# Patient Record
Sex: Female | Born: 1945 | Race: Black or African American | Hispanic: No | Smoking: Former smoker
Health system: Southern US, Community
[De-identification: ages and names within clinical notes are randomized; demographics above are authoritative.]

## PROBLEM LIST (undated history)

## (undated) DIAGNOSIS — M1990 Unspecified osteoarthritis, unspecified site: Secondary | ICD-10-CM

## (undated) DIAGNOSIS — R011 Cardiac murmur, unspecified: Secondary | ICD-10-CM

## (undated) DIAGNOSIS — E785 Hyperlipidemia, unspecified: Secondary | ICD-10-CM

## (undated) DIAGNOSIS — H409 Unspecified glaucoma: Secondary | ICD-10-CM

## (undated) DIAGNOSIS — I639 Cerebral infarction, unspecified: Secondary | ICD-10-CM

## (undated) DIAGNOSIS — I10 Essential (primary) hypertension: Secondary | ICD-10-CM

## (undated) DIAGNOSIS — N19 Unspecified kidney failure: Secondary | ICD-10-CM

## (undated) HISTORY — DX: Cerebral infarction, unspecified: I63.9

## (undated) HISTORY — DX: Essential (primary) hypertension: I10

## (undated) HISTORY — DX: Unspecified kidney failure: N19

## (undated) HISTORY — DX: Unspecified glaucoma: H40.9

## (undated) HISTORY — DX: Hyperlipidemia, unspecified: E78.5

## (undated) HISTORY — DX: Cardiac murmur, unspecified: R01.1

---

## 1978-09-02 HISTORY — PX: UMBILICAL HERNIA REPAIR: SHX196

## 1990-09-02 HISTORY — PX: ABDOMINAL HYSTERECTOMY: SHX81

## 1990-09-02 HISTORY — PX: OTHER SURGICAL HISTORY: SHX169

## 2002-09-02 HISTORY — PX: HIATAL HERNIA REPAIR: SHX195

## 2013-06-02 DIAGNOSIS — I639 Cerebral infarction, unspecified: Secondary | ICD-10-CM

## 2013-06-02 HISTORY — DX: Cerebral infarction, unspecified: I63.9

## 2014-05-11 ENCOUNTER — Ambulatory Visit (INDEPENDENT_AMBULATORY_CARE_PROVIDER_SITE_OTHER): Payer: 59 | Admitting: Family

## 2014-05-11 ENCOUNTER — Encounter (INDEPENDENT_AMBULATORY_CARE_PROVIDER_SITE_OTHER): Payer: Self-pay

## 2014-05-11 ENCOUNTER — Encounter: Payer: Self-pay | Admitting: Family

## 2014-05-11 VITALS — BP 203/102 | HR 65 | Temp 98.2°F | Ht 64.5 in | Wt 149.0 lb

## 2014-05-11 DIAGNOSIS — R059 Cough, unspecified: Secondary | ICD-10-CM

## 2014-05-11 DIAGNOSIS — I10 Essential (primary) hypertension: Secondary | ICD-10-CM

## 2014-05-11 DIAGNOSIS — R05 Cough: Secondary | ICD-10-CM

## 2014-05-11 DIAGNOSIS — Z0000 Encounter for general adult medical examination without abnormal findings: Secondary | ICD-10-CM

## 2014-05-11 DIAGNOSIS — I1A0 Resistant hypertension: Secondary | ICD-10-CM

## 2014-05-11 HISTORY — DX: Essential (primary) hypertension: I10

## 2014-05-11 HISTORY — DX: Resistant hypertension: I1A.0

## 2014-05-11 MED ORDER — AMLODIPINE BESYLATE 5 MG PO TABS: 5.0000 mg | ORAL_TABLET | Freq: Every day | ORAL | Status: DC

## 2014-05-11 MED ORDER — ENALAPRIL-HYDROCHLOROTHIAZIDE 10-25 MG PO TABS: 1.0000 | ORAL_TABLET | Freq: Every day | ORAL | Status: DC

## 2014-05-11 MED ORDER — BENZONATATE 200 MG PO CAPS: 200.0000 mg | ORAL_CAPSULE | Freq: Three times a day (TID) | ORAL | Status: DC | PRN

## 2014-05-11 NOTE — Progress Notes (Addendum)
Subjective:    Patient ID: Andrea Ross, female    DOB: [birth year], 68 y.o.   MRN: [id number]  Hypertension This is a chronic problem. The current episode started more than 1 year ago. The problem has been waxing and waning since onset. The problem is uncontrolled. Associated symptoms include blurred vision and headaches. Pertinent negatives include no anxiety, palpitations, peripheral edema or shortness of breath. Risk factors for coronary artery disease include post-menopausal state and family history. Past treatments include ACE inhibitors and calcium channel blockers. The current treatment provides no improvement. Hypertensive end-organ damage includes CVA. There is no history of kidney disease, CAD/MI, heart failure or a thyroid problem. There is no history of sleep apnea.   *Pt states she had a stroke 10/14. Pt has moved from [place] to here and has not been taking her blood pressure medication regularly. Stating she was trying to make it last until she could get an appointment.    Review of Systems  Constitutional: Negative.   HENT: Positive for tinnitus.   Eyes: Positive for blurred vision.  Respiratory: Negative.  Negative for shortness of breath.   Cardiovascular: Negative.  Negative for palpitations.  Gastrointestinal: Negative.   Endocrine: Negative.   Genitourinary: Negative.   Musculoskeletal: Negative.   Neurological: Positive for headaches.  Hematological: Negative.   Psychiatric/Behavioral: Negative.   All other systems reviewed and are negative.      Objective:   Physical Exam  Vitals reviewed. Constitutional: She is oriented to person, place, and time. She appears well-developed and well-nourished. No distress.  HENT:  Head: Normocephalic and atraumatic.  Right Ear: External ear normal.  Mouth/Throat: Oropharynx is clear and moist.  Eyes: Pupils are equal, round, and reactive to light.  Neck: Normal range of motion. Neck supple. No thyromegaly present.    Cardiovascular: Normal rate, regular rhythm, normal heart sounds and intact distal pulses.   No murmur heard. Pulmonary/Chest: Effort normal and breath sounds normal. No respiratory distress. She has no wheezes.  Abdominal: Soft. Bowel sounds are normal. She exhibits no distension. There is no tenderness.  Musculoskeletal: Normal range of motion. She exhibits no edema and no tenderness.  Neurological: She is alert and oriented to person, place, and time. She has normal reflexes. No cranial nerve deficit.  Skin: Skin is warm and dry.  Psychiatric: She has a normal mood and affect. Her behavior is normal. Judgment and thought content normal.    BP 203/102  Pulse 65  Temp(Src) 98.2 F (36.8 C) (Oral)  Ht 5' 4.5" (1.638 m)  Wt 149 lb (67.586 kg)  BMI 25.19 kg/m2       Assessment & Plan:  1. Essential hypertension, benign -Daily blood pressure log given with instructions on how to fill out and told to bring to next visit -Dash diet information given -Exercise encouraged - Stress Management  -Continue current meds -RTO in 1 weeks  - amLODipine (NORVASC) 5 MG tablet; Take 1 tablet (5 mg total) by mouth daily.  Dispense: 90 tablet; Refill: 3 - CMP14+EGFR - enalapril-hydrochlorothiazide (VASERETIC) 10-25 MG per tablet; Take 1 tablet by mouth daily.  Dispense: 90 tablet; Refill: 3  2. Annual physical exam - CMP14+EGFR - Lipid panel - Vit D  25 hydroxy (rtn osteoporosis monitoring)  3. Cough -DO NOT TAKE ANY DECONGESTANT  - benzonatate (TESSALON) 200 MG capsule; Take 1 capsule (200 mg total) by mouth 3 (three) times daily as needed for cough.  Dispense: 30 capsule; Refill: 2  Continue all meds  Labs pending Health Maintenance reviewed- hemoccult cards given to patient with directions Diet and exercise encouraged RTO 1 weeks for blood pressyre  Evelina Dun, FNP

## 2014-05-11 NOTE — Patient Instructions (Signed)
DASH Eating Plan DASH stands for "Dietary Approaches to Stop Hypertension." The DASH eating plan is a healthy eating plan that has been shown to reduce high blood pressure (hypertension). Additional health benefits may include reducing the risk of type 2 diabetes mellitus, heart disease, and stroke. The DASH eating plan may also help with weight loss. WHAT DO I NEED TO KNOW ABOUT THE DASH EATING PLAN? For the DASH eating plan, you will follow these general guidelines:  Choose foods with a percent daily value for sodium of less than 5% (as listed on the food label).  Use salt-free seasonings or herbs instead of table salt or sea salt.  Check with your health care provider or pharmacist before using salt substitutes.  Eat lower-sodium products, often labeled as "lower sodium" or "no salt added."  Eat fresh foods.  Eat more vegetables, fruits, and low-fat dairy products.  Choose whole grains. Look for the word "whole" as the first word in the ingredient list.  Choose fish and skinless chicken or turkey more often than red meat. Limit fish, poultry, and meat to 6 oz (170 g) each day.  Limit sweets, desserts, sugars, and sugary drinks.  Choose heart-healthy fats.  Limit cheese to 1 oz (28 g) per day.  Eat more home-cooked food and less restaurant, buffet, and fast food.  Limit fried foods.  Cook foods using methods other than frying.  Limit canned vegetables. If you do use them, rinse them well to decrease the sodium.  When eating at a restaurant, ask that your food be prepared with less salt, or no salt if possible. WHAT FOODS CAN I EAT? Seek help from a dietitian for individual calorie needs. Grains Whole grain or whole wheat bread. Brown rice. Whole grain or whole wheat pasta. Quinoa, bulgur, and whole grain cereals. Low-sodium cereals. Corn or whole wheat flour tortillas. Whole grain cornbread. Whole grain crackers. Low-sodium crackers. Vegetables Fresh or frozen vegetables  (raw, steamed, roasted, or grilled). Low-sodium or reduced-sodium tomato and vegetable juices. Low-sodium or reduced-sodium tomato sauce and paste. Low-sodium or reduced-sodium canned vegetables.  Fruits All fresh, canned (in natural juice), or frozen fruits. Meat and Other Protein Products Ground beef (85% or leaner), grass-fed beef, or beef trimmed of fat. Skinless chicken or turkey. Ground chicken or turkey. Pork trimmed of fat. All fish and seafood. Eggs. Dried beans, peas, or lentils. Unsalted nuts and seeds. Unsalted canned beans. Dairy Low-fat dairy products, such as skim or 1% milk, 2% or reduced-fat cheeses, low-fat ricotta or cottage cheese, or plain low-fat yogurt. Low-sodium or reduced-sodium cheeses. Fats and Oils Tub margarines without trans fats. Light or reduced-fat mayonnaise and salad dressings (reduced sodium). Avocado. Safflower, olive, or canola oils. Natural peanut or almond butter. Other Unsalted popcorn and pretzels. The items listed above may not be a complete list of recommended foods or beverages. Contact your dietitian for more options. WHAT FOODS ARE NOT RECOMMENDED? Grains White bread. White pasta. White rice. Refined cornbread. Bagels and croissants. Crackers that contain trans fat. Vegetables Creamed or fried vegetables. Vegetables in a cheese sauce. Regular canned vegetables. Regular canned tomato sauce and paste. Regular tomato and vegetable juices. Fruits Dried fruits. Canned fruit in light or heavy syrup. Fruit juice. Meat and Other Protein Products Fatty cuts of meat. Ribs, chicken wings, bacon, sausage, bologna, salami, chitterlings, fatback, hot dogs, bratwurst, and packaged luncheon meats. Salted nuts and seeds. Canned beans with salt. Dairy Whole or 2% milk, cream, half-and-half, and cream cheese. Whole-fat or sweetened yogurt. Full-fat   cheeses or blue cheese. Nondairy creamers and whipped toppings. Processed cheese, cheese spreads, or cheese  curds. Condiments Onion and garlic salt, seasoned salt, table salt, and sea salt. Canned and packaged gravies. Worcestershire sauce. Tartar sauce. Barbecue sauce. Teriyaki sauce. Soy sauce, including reduced sodium. Steak sauce. Fish sauce. Oyster sauce. Cocktail sauce. Horseradish. Ketchup and mustard. Meat flavorings and tenderizers. Bouillon cubes. Hot sauce. Tabasco sauce. Marinades. Taco seasonings. Relishes. Fats and Oils Butter, stick margarine, lard, shortening, ghee, and bacon fat. Coconut, palm kernel, or palm oils. Regular salad dressings. Other Pickles and olives. Salted popcorn and pretzels. The items listed above may not be a complete list of foods and beverages to avoid. Contact your dietitian for more information. WHERE CAN I FIND MORE INFORMATION? National Heart, Lung, and [employer] Institute: www.nhlbi.nih.gov/health/health-topics/topics/dash/ Document Released: 08/08/2011 Document Revised: 01/03/2014 Document Reviewed: 06/23/2013 [employer] Patient Information 2015 [employer]. This information is not intended to replace advice given to you by your health care provider. Make sure you discuss any questions you have with your health care provider. Hypertension Hypertension, commonly called high blood pressure, is when the force of blood pumping through your arteries is too strong. Your arteries are the blood vessels that carry blood from your heart throughout your body. A blood pressure reading consists of a higher number over a lower number, such as 110/72. The higher number (systolic) is the pressure inside your arteries when your heart pumps. The lower number (diastolic) is the pressure inside your arteries when your heart relaxes. Ideally you want your blood pressure below 120/80. Hypertension forces your heart to work harder to pump blood. Your arteries may become narrow or stiff. Having hypertension puts you at risk for heart disease, stroke, and other problems.  RISK  FACTORS Some risk factors for high blood pressure are controllable. Others are not.  Risk factors you cannot control include:   Race. You may be at higher risk if you are African American.  Age. Risk increases with age.  Gender. Men are at higher risk than women before age 45 years. After age 65, women are at higher risk than men. Risk factors you can control include:  Not getting enough exercise or physical activity.  Being overweight.  Getting too much fat, sugar, calories, or salt in your diet.  Drinking too much alcohol. SIGNS AND SYMPTOMS Hypertension does not usually cause signs or symptoms. Extremely high blood pressure (hypertensive crisis) may cause headache, anxiety, shortness of breath, and nosebleed. DIAGNOSIS  To check if you have hypertension, your health care provider will measure your blood pressure while you are seated, with your arm held at the level of your heart. It should be measured at least twice using the same arm. Certain conditions can cause a difference in blood pressure between your right and left arms. A blood pressure reading that is higher than normal on one occasion does not mean that you need treatment. If one blood pressure reading is high, ask your health care provider about having it checked again. TREATMENT  Treating high blood pressure includes making lifestyle changes and possibly taking medicine. Living a healthy lifestyle can help lower high blood pressure. You may need to change some of your habits. Lifestyle changes may include:  Following the DASH diet. This diet is high in fruits, vegetables, and whole grains. It is low in salt, red meat, and added sugars.  Getting at least 2 hours of brisk physical activity every week.  Losing weight if necessary.  Not smoking.  Limiting   alcoholic beverages.  Learning ways to reduce stress. If lifestyle changes are not enough to get your blood pressure under control, your health care provider may  prescribe medicine. You may need to take more than one. Work closely with your health care provider to understand the risks and benefits. HOME CARE INSTRUCTIONS  Have your blood pressure rechecked as directed by your health care provider.   Take medicines only as directed by your health care provider. Follow the directions carefully. Blood pressure medicines must be taken as prescribed. The medicine does not work as well when you skip doses. Skipping doses also puts you at risk for problems.   Do not smoke.   Monitor your blood pressure at home as directed by your health care provider. SEEK MEDICAL CARE IF:   You think you are having a reaction to medicines taken.  You have recurrent headaches or feel dizzy.  You have swelling in your ankles.  You have trouble with your vision. SEEK IMMEDIATE MEDICAL CARE IF:  You develop a severe headache or confusion.  You have unusual weakness, numbness, or feel faint.  You have severe chest or abdominal pain.  You vomit repeatedly.  You have trouble breathing. MAKE SURE YOU:   Understand these instructions.  Will watch your condition.  Will get help right away if you are not doing well or get worse. Document Released: 08/19/2005 Document Revised: 01/03/2014 Document Reviewed: 06/11/2013 [employer] Patient Information 2015 [employer]. This information is not intended to replace advice given to you by your health care provider. Make sure you discuss any questions you have with your health care provider.  

## 2014-05-12 LAB — CMP14+EGFR
ALT: 12 IU/L (ref 0–32)
AST: 11 IU/L (ref 0–40)
Albumin/Globulin Ratio: 1.6 (ref 1.1–2.5)
Albumin: 4.5 g/dL (ref 3.5–4.8)
Alkaline Phosphatase: 132 IU/L — ABNORMAL HIGH (ref 39–117)
BUN/Creatinine Ratio: 10 — ABNORMAL LOW (ref 11–26)
BUN: 12 mg/dL (ref 8–27)
CALCIUM: 10 mg/dL (ref 8.7–10.3)
CO2: 25 mmol/L (ref 18–29)
Chloride: 102 mmol/L (ref 97–108)
Creatinine, Ser: 1.22 mg/dL — ABNORMAL HIGH (ref 0.57–1.00)
GFR calc Af Amer: 52 mL/min/{1.73_m2} — ABNORMAL LOW (ref >59)
GFR calc non Af Amer: 45 mL/min/{1.73_m2} — ABNORMAL LOW (ref >59)
GLUCOSE: 92 mg/dL (ref 65–99)
Globulin, Total: 2.9 g/dL (ref 1.5–4.5)
Potassium: 4.7 mmol/L (ref 3.5–5.2)
Sodium: 144 mmol/L (ref 134–144)
TOTAL PROTEIN: 7.4 g/dL (ref 6.0–8.5)
Total Bilirubin: 0.4 mg/dL (ref 0.0–1.2)

## 2014-05-12 LAB — LIPID PANEL
CHOL/HDL RATIO: 5.2 ratio — AB (ref 0.0–4.4)
Cholesterol, Total: 262 mg/dL — ABNORMAL HIGH (ref 100–199)
HDL: 50 mg/dL (ref >39)
LDL Calculated: 176 mg/dL — ABNORMAL HIGH (ref 0–99)
Triglycerides: 179 mg/dL — ABNORMAL HIGH (ref 0–149)
VLDL Cholesterol Cal: 36 mg/dL (ref 5–40)

## 2014-05-12 LAB — VITAMIN D 25 HYDROXY (VIT D DEFICIENCY, FRACTURES): Vit D, 25-Hydroxy: 21.3 ng/mL — ABNORMAL LOW (ref 30.0–100.0)

## 2014-05-13 ENCOUNTER — Other Ambulatory Visit: Payer: Self-pay | Admitting: Family

## 2014-05-13 MED ORDER — VITAMIN D (ERGOCALCIFEROL) 1.25 MG (50000 UNIT) PO CAPS: 50000.0000 [IU] | ORAL_CAPSULE | ORAL | Status: DC

## 2014-05-13 MED ORDER — ATORVASTATIN CALCIUM 40 MG PO TABS: 40.0000 mg | ORAL_TABLET | Freq: Every day | ORAL | Status: DC

## 2014-05-18 ENCOUNTER — Ambulatory Visit (INDEPENDENT_AMBULATORY_CARE_PROVIDER_SITE_OTHER): Payer: 59 | Admitting: Family

## 2014-05-18 ENCOUNTER — Encounter: Payer: Self-pay | Admitting: Family

## 2014-05-18 VITALS — BP 151/90 | HR 69 | Temp 98.3°F | Ht 64.5 in | Wt 149.4 lb

## 2014-05-18 DIAGNOSIS — I10 Essential (primary) hypertension: Secondary | ICD-10-CM

## 2014-05-18 MED ORDER — AMLODIPINE BESYLATE 10 MG PO TABS: 10.0000 mg | ORAL_TABLET | Freq: Every day | ORAL | Status: DC

## 2014-05-18 MED ORDER — ENALAPRIL MALEATE 10 MG PO TABS: 10.0000 mg | ORAL_TABLET | Freq: Every day | ORAL | Status: DC

## 2014-05-18 NOTE — Patient Instructions (Signed)
DASH Eating Plan DASH stands for "Dietary Approaches to Stop Hypertension." The DASH eating plan is a healthy eating plan that has been shown to reduce high blood pressure (hypertension). Additional health benefits may include reducing the risk of type 2 diabetes mellitus, heart disease, and stroke. The DASH eating plan may also help with weight loss. WHAT DO I NEED TO KNOW ABOUT THE DASH EATING PLAN? For the DASH eating plan, you will follow these general guidelines:  Choose foods with a percent daily value for sodium of less than 5% (as listed on the food label).  Use salt-free seasonings or herbs instead of table salt or sea salt.  Check with your health care provider or pharmacist before using salt substitutes.  Eat lower-sodium products, often labeled as "lower sodium" or "no salt added."  Eat fresh foods.  Eat more vegetables, fruits, and low-fat dairy products.  Choose whole grains. Look for the word "whole" as the first word in the ingredient list.  Choose fish and skinless chicken or turkey more often than red meat. Limit fish, poultry, and meat to 6 oz (170 g) each day.  Limit sweets, desserts, sugars, and sugary drinks.  Choose heart-healthy fats.  Limit cheese to 1 oz (28 g) per day.  Eat more home-cooked food and less restaurant, buffet, and fast food.  Limit fried foods.  Cook foods using methods other than frying.  Limit canned vegetables. If you do use them, rinse them well to decrease the sodium.  When eating at a restaurant, ask that your food be prepared with less salt, or no salt if possible. WHAT FOODS CAN I EAT? Seek help from a dietitian for individual calorie needs. Grains Whole grain or whole wheat bread. Brown rice. Whole grain or whole wheat pasta. Quinoa, bulgur, and whole grain cereals. Low-sodium cereals. Corn or whole wheat flour tortillas. Whole grain cornbread. Whole grain crackers. Low-sodium crackers. Vegetables Fresh or frozen vegetables  (raw, steamed, roasted, or grilled). Low-sodium or reduced-sodium tomato and vegetable juices. Low-sodium or reduced-sodium tomato sauce and paste. Low-sodium or reduced-sodium canned vegetables.  Fruits All fresh, canned (in natural juice), or frozen fruits. Meat and Other Protein Products Ground beef (85% or leaner), grass-fed beef, or beef trimmed of fat. Skinless chicken or turkey. Ground chicken or turkey. Pork trimmed of fat. All fish and seafood. Eggs. Dried beans, peas, or lentils. Unsalted nuts and seeds. Unsalted canned beans. Dairy Low-fat dairy products, such as skim or 1% milk, 2% or reduced-fat cheeses, low-fat ricotta or cottage cheese, or plain low-fat yogurt. Low-sodium or reduced-sodium cheeses. Fats and Oils Tub margarines without trans fats. Light or reduced-fat mayonnaise and salad dressings (reduced sodium). Avocado. Safflower, olive, or canola oils. Natural peanut or almond butter. Other Unsalted popcorn and pretzels. The items listed above may not be a complete list of recommended foods or beverages. Contact your dietitian for more options. WHAT FOODS ARE NOT RECOMMENDED? Grains White bread. White pasta. White rice. Refined cornbread. Bagels and croissants. Crackers that contain trans fat. Vegetables Creamed or fried vegetables. Vegetables in a cheese sauce. Regular canned vegetables. Regular canned tomato sauce and paste. Regular tomato and vegetable juices. Fruits Dried fruits. Canned fruit in light or heavy syrup. Fruit juice. Meat and Other Protein Products Fatty cuts of meat. Ribs, chicken wings, bacon, sausage, bologna, salami, chitterlings, fatback, hot dogs, bratwurst, and packaged luncheon meats. Salted nuts and seeds. Canned beans with salt. Dairy Whole or 2% milk, cream, half-and-half, and cream cheese. Whole-fat or sweetened yogurt. Full-fat   cheeses or blue cheese. Nondairy creamers and whipped toppings. Processed cheese, cheese spreads, or cheese  curds. Condiments Onion and garlic salt, seasoned salt, table salt, and sea salt. Canned and packaged gravies. Worcestershire sauce. Tartar sauce. Barbecue sauce. Teriyaki sauce. Soy sauce, including reduced sodium. Steak sauce. Fish sauce. Oyster sauce. Cocktail sauce. Horseradish. Ketchup and mustard. Meat flavorings and tenderizers. Bouillon cubes. Hot sauce. Tabasco sauce. Marinades. Taco seasonings. Relishes. Fats and Oils Butter, stick margarine, lard, shortening, ghee, and bacon fat. Coconut, palm kernel, or palm oils. Regular salad dressings. Other Pickles and olives. Salted popcorn and pretzels. The items listed above may not be a complete list of foods and beverages to avoid. Contact your dietitian for more information. WHERE CAN I FIND MORE INFORMATION? National Heart, Lung, and [employer] Institute: www.nhlbi.nih.gov/health/health-topics/topics/dash/ Document Released: 08/08/2011 Document Revised: 01/03/2014 Document Reviewed: 06/23/2013 [employer] Patient Information 2015 [employer]. This information is not intended to replace advice given to you by your health care provider. Make sure you discuss any questions you have with your health care provider. Hypertension Hypertension, commonly called high blood pressure, is when the force of blood pumping through your arteries is too strong. Your arteries are the blood vessels that carry blood from your heart throughout your body. A blood pressure reading consists of a higher number over a lower number, such as 110/72. The higher number (systolic) is the pressure inside your arteries when your heart pumps. The lower number (diastolic) is the pressure inside your arteries when your heart relaxes. Ideally you want your blood pressure below 120/80. Hypertension forces your heart to work harder to pump blood. Your arteries may become narrow or stiff. Having hypertension puts you at risk for heart disease, stroke, and other problems.  RISK  FACTORS Some risk factors for high blood pressure are controllable. Others are not.  Risk factors you cannot control include:   Race. You may be at higher risk if you are African American.  Age. Risk increases with age.  Gender. Men are at higher risk than women before age 45 years. After age 65, women are at higher risk than men. Risk factors you can control include:  Not getting enough exercise or physical activity.  Being overweight.  Getting too much fat, sugar, calories, or salt in your diet.  Drinking too much alcohol. SIGNS AND SYMPTOMS Hypertension does not usually cause signs or symptoms. Extremely high blood pressure (hypertensive crisis) may cause headache, anxiety, shortness of breath, and nosebleed. DIAGNOSIS  To check if you have hypertension, your health care provider will measure your blood pressure while you are seated, with your arm held at the level of your heart. It should be measured at least twice using the same arm. Certain conditions can cause a difference in blood pressure between your right and left arms. A blood pressure reading that is higher than normal on one occasion does not mean that you need treatment. If one blood pressure reading is high, ask your health care provider about having it checked again. TREATMENT  Treating high blood pressure includes making lifestyle changes and possibly taking medicine. Living a healthy lifestyle can help lower high blood pressure. You may need to change some of your habits. Lifestyle changes may include:  Following the DASH diet. This diet is high in fruits, vegetables, and whole grains. It is low in salt, red meat, and added sugars.  Getting at least 2 hours of brisk physical activity every week.  Losing weight if necessary.  Not smoking.  Limiting   alcoholic beverages.  Learning ways to reduce stress. If lifestyle changes are not enough to get your blood pressure under control, your health care provider may  prescribe medicine. You may need to take more than one. Work closely with your health care provider to understand the risks and benefits. HOME CARE INSTRUCTIONS  Have your blood pressure rechecked as directed by your health care provider.   Take medicines only as directed by your health care provider. Follow the directions carefully. Blood pressure medicines must be taken as prescribed. The medicine does not work as well when you skip doses. Skipping doses also puts you at risk for problems.   Do not smoke.   Monitor your blood pressure at home as directed by your health care provider. SEEK MEDICAL CARE IF:   You think you are having a reaction to medicines taken.  You have recurrent headaches or feel dizzy.  You have swelling in your ankles.  You have trouble with your vision. SEEK IMMEDIATE MEDICAL CARE IF:  You develop a severe headache or confusion.  You have unusual weakness, numbness, or feel faint.  You have severe chest or abdominal pain.  You vomit repeatedly.  You have trouble breathing. MAKE SURE YOU:   Understand these instructions.  Will watch your condition.  Will get help right away if you are not doing well or get worse. Document Released: 08/19/2005 Document Revised: 01/03/2014 Document Reviewed: 06/11/2013 [employer] Patient Information 2015 [employer]. This information is not intended to replace advice given to you by your health care provider. Make sure you discuss any questions you have with your health care provider.  

## 2014-05-18 NOTE — Progress Notes (Signed)
   Subjective:    Patient ID: Andrea Ross, female    DOB: May 16, 1946, 68 y.o.   MRN: [id number]  Pt presents to the office for follow-up for hypertension. Pt was seen in office one week ago with a BP of 203/102. PT had not been taking medication. Pt currently taking ACE, CCB, and HCTZ.   Hypertension This is a chronic problem. The current episode started more than 1 year ago. The problem has been waxing and waning since onset. The problem is uncontrolled. Pertinent negatives include no anxiety, blurred vision, headaches, palpitations, peripheral edema or shortness of breath. Risk factors for coronary artery disease include dyslipidemia. Past treatments include ACE inhibitors, calcium channel blockers and diuretics. The current treatment provides mild improvement. Hypertensive end-organ damage includes CVA. There is no history of kidney disease, CAD/MI, heart failure or a thyroid problem. There is no history of sleep apnea.      Review of Systems  Constitutional: Negative.   HENT: Negative.   Eyes: Negative.  Negative for blurred vision.  Respiratory: Negative.  Negative for shortness of breath.   Cardiovascular: Negative.  Negative for palpitations.  Gastrointestinal: Negative.   Endocrine: Negative.   Genitourinary: Negative.   Musculoskeletal: Negative.   Neurological: Negative.  Negative for headaches.  Hematological: Negative.   Psychiatric/Behavioral: Negative.   All other systems reviewed and are negative.      Objective:   Physical Exam  Vitals reviewed. Constitutional: She is oriented to person, place, and time. She appears well-developed and well-nourished. No distress.  HENT:  Head: Normocephalic and atraumatic.  Right Ear: External ear normal.  Mouth/Throat: Oropharynx is clear and moist.  Eyes: Pupils are equal, round, and reactive to light.  Neck: Normal range of motion. Neck supple. No thyromegaly present.  Cardiovascular: Normal rate, regular rhythm, normal heart  sounds and intact distal pulses.   No murmur heard. Pulmonary/Chest: Effort normal and breath sounds normal. No respiratory distress. She has no wheezes.  Abdominal: Soft. Bowel sounds are normal. She exhibits no distension. There is no tenderness.  Musculoskeletal: Normal range of motion. She exhibits no edema and no tenderness.  Neurological: She is alert and oriented to person, place, and time. She has normal reflexes. No cranial nerve deficit.  Skin: Skin is warm and dry.  Psychiatric: She has a normal mood and affect. Her behavior is normal. Judgment and thought content normal.   BP 151/90  Pulse 69  Temp(Src) 98.3 F (36.8 C) (Oral)  Ht 5' 4.5" (1.638 m)  Wt 149 lb 6.4 oz (67.767 kg)  BMI 25.26 kg/m2        Assessment & Plan:  1. Essential hypertension, benign --Daily blood pressure log given with instructions on how to fill out and told to bring to next visit -Dash diet information given -Exercise encouraged - Stress Management  -Continue current meds -RTO in 2 weeks - enalapril (VASOTEC) 10 MG tablet; Take 1 tablet (10 mg total) by mouth daily.  Dispense: 90 tablet; Refill: 3 - amLODipine (NORVASC) 10 MG tablet; Take 1 tablet (10 mg total) by mouth daily.  Dispense: 90 tablet; Refill: [place], FNP

## 2014-06-01 ENCOUNTER — Encounter: Payer: Self-pay | Admitting: Family

## 2014-06-01 ENCOUNTER — Ambulatory Visit (INDEPENDENT_AMBULATORY_CARE_PROVIDER_SITE_OTHER): Payer: 59 | Admitting: Family

## 2014-06-01 VITALS — BP 141/96 | HR 66 | Temp 98.5°F | Ht 64.5 in | Wt 148.0 lb

## 2014-06-01 DIAGNOSIS — I10 Essential (primary) hypertension: Secondary | ICD-10-CM

## 2014-06-01 MED ORDER — LOSARTAN POTASSIUM-HCTZ 100-25 MG PO TABS: 1.0000 | ORAL_TABLET | Freq: Every day | ORAL | Status: DC

## 2014-06-01 NOTE — Patient Instructions (Signed)
Hypertension Hypertension, commonly called high blood pressure, is when the force of blood pumping through your arteries is too strong. Your arteries are the blood vessels that carry blood from your heart throughout your body. A blood pressure reading consists of a higher number over a lower number, such as 110/72. The higher number (systolic) is the pressure inside your arteries when your heart pumps. The lower number (diastolic) is the pressure inside your arteries when your heart relaxes. Ideally you want your blood pressure below 120/80. Hypertension forces your heart to work harder to pump blood. Your arteries may become narrow or stiff. Having hypertension puts you at risk for heart disease, stroke, and other problems.  RISK FACTORS Some risk factors for high blood pressure are controllable. Others are not.  Risk factors you cannot control include:   Race. You may be at higher risk if you are African American.  Age. Risk increases with age.  Gender. Men are at higher risk than women before age 45 years. After age 65, women are at higher risk than men. Risk factors you can control include:  Not getting enough exercise or physical activity.  Being overweight.  Getting too much fat, sugar, calories, or salt in your diet.  Drinking too much alcohol. SIGNS AND SYMPTOMS Hypertension does not usually cause signs or symptoms. Extremely high blood pressure (hypertensive crisis) may cause headache, anxiety, shortness of breath, and nosebleed. DIAGNOSIS  To check if you have hypertension, your health care provider will measure your blood pressure while you are seated, with your arm held at the level of your heart. It should be measured at least twice using the same arm. Certain conditions can cause a difference in blood pressure between your right and left arms. A blood pressure reading that is higher than normal on one occasion does not mean that you need treatment. If one blood pressure reading  is high, ask your health care provider about having it checked again. TREATMENT  Treating high blood pressure includes making lifestyle changes and possibly taking medicine. Living a healthy lifestyle can help lower high blood pressure. You may need to change some of your habits. Lifestyle changes may include:  Following the DASH diet. This diet is high in fruits, vegetables, and whole grains. It is low in salt, red meat, and added sugars.  Getting at least 2 hours of brisk physical activity every week.  Losing weight if necessary.  Not smoking.  Limiting alcoholic beverages.  Learning ways to reduce stress. If lifestyle changes are not enough to get your blood pressure under control, your health care provider may prescribe medicine. You may need to take more than one. Work closely with your health care provider to understand the risks and benefits. HOME CARE INSTRUCTIONS  Have your blood pressure rechecked as directed by your health care provider.   Take medicines only as directed by your health care provider. Follow the directions carefully. Blood pressure medicines must be taken as prescribed. The medicine does not work as well when you skip doses. Skipping doses also puts you at risk for problems.   Do not smoke.   Monitor your blood pressure at home as directed by your health care provider. SEEK MEDICAL CARE IF:   You think you are having a reaction to medicines taken.  You have recurrent headaches or feel dizzy.  You have swelling in your ankles.  You have trouble with your vision. SEEK IMMEDIATE MEDICAL CARE IF:  You develop a severe headache or confusion.    You have unusual weakness, numbness, or feel faint.  You have severe chest or abdominal pain.  You vomit repeatedly.  You have trouble breathing. MAKE SURE YOU:   Understand these instructions.  Will watch your condition.  Will get help right away if you are not doing well or get worse. Document  Released: 08/19/2005 Document Revised: 01/03/2014 Document Reviewed: 06/11/2013 [facility] Patient Information 2015 [place], [place]. This information is not intended to replace advice given to you by your health care provider. Make sure you discuss any questions you have with your health care provider. Cough, Adult  A cough is a reflex that helps clear your throat and airways. It can help heal the body or may be a reaction to an irritated airway. A cough may only last 2 or 3 weeks (acute) or may last more than 8 weeks (chronic).  CAUSES Acute cough:  Viral or bacterial infections. Chronic cough:  Infections.  Allergies.  Asthma.  Post-nasal drip.  Smoking.  Heartburn or acid reflux.  Some medicines.  Chronic lung problems (COPD).  Cancer. SYMPTOMS   Cough.  Fever.  Chest pain.  Increased breathing rate.  High-pitched whistling sound when breathing (wheezing).  Colored mucus that you cough up (sputum). TREATMENT   A bacterial cough may be treated with antibiotic medicine.  A viral cough must run its course and will not respond to antibiotics.  Your caregiver may recommend other treatments if you have a chronic cough. HOME CARE INSTRUCTIONS   Only take over-the-counter or prescription medicines for pain, discomfort, or fever as directed by your caregiver. Use cough suppressants only as directed by your caregiver.  Use a cold steam vaporizer or humidifier in your bedroom or home to help loosen secretions.  Sleep in a semi-upright position if your cough is worse at night.  Rest as needed.  Stop smoking if you smoke. SEEK IMMEDIATE MEDICAL CARE IF:   You have pus in your sputum.  Your cough starts to worsen.  You cannot control your cough with suppressants and are losing sleep.  You begin coughing up blood.  You have difficulty breathing.  You develop pain which is getting worse or is uncontrolled with medicine.  You have a fever. MAKE SURE YOU:    Understand these instructions.  Will watch your condition.  Will get help right away if you are not doing well or get worse. Document Released: 02/15/2011 Document Revised: 11/11/2011 Document Reviewed: 02/15/2011 [facility] Patient Information 2015 [place], [place]. This information is not intended to replace advice given to you by your health care provider. Make sure you discuss any questions you have with your health care provider.

## 2014-06-01 NOTE — Progress Notes (Signed)
   Subjective:    Patient ID: Andrea Ross, female    DOB: 1945/10/12, 68 y.o.   MRN: [id number]  Hypertension Pertinent negatives include no headaches, palpitations or shortness of breath.   Pt presents to the office for follow-up on hypertension. Pt's blood pressure is not at goal today. Pt denies any headache, palpitations, SOB, or edema. Pt brought in home blood pressure log with avg BP at 130's/ 87's.    Review of Systems  Constitutional: Negative.   HENT: Negative.   Eyes: Negative.   Respiratory: Negative.  Negative for shortness of breath.   Cardiovascular: Negative.  Negative for palpitations.  Gastrointestinal: Negative.   Endocrine: Negative.   Genitourinary: Negative.   Musculoskeletal: Negative.   Neurological: Negative.  Negative for headaches.  Hematological: Negative.   Psychiatric/Behavioral: Negative.   All other systems reviewed and are negative.      Objective:   Physical Exam  Vitals reviewed. Constitutional: She is oriented to person, place, and time. She appears well-developed and well-nourished. No distress.  Eyes: Pupils are equal, round, and reactive to light.  Neck: Normal range of motion. Neck supple. No thyromegaly present.  Cardiovascular: Normal rate, regular rhythm, normal heart sounds and intact distal pulses.   No murmur heard. Pulmonary/Chest: Effort normal and breath sounds normal. No respiratory distress. She has no wheezes.  Abdominal: Soft. Bowel sounds are normal. She exhibits no distension. There is no tenderness.  Musculoskeletal: Normal range of motion. She exhibits no edema and no tenderness.  Neurological: She is alert and oriented to person, place, and time. She has normal reflexes. No cranial nerve deficit.  Skin: Skin is warm and dry.  Psychiatric: She has a normal mood and affect. Her behavior is normal. Judgment and thought content normal.     BP 141/96  Pulse 66  Temp(Src) 98.5 F (36.9 C) (Oral)  Ht 5' 4.5" (1.638 m)   Wt 148 lb (67.132 kg)  BMI 25.02 kg/m2      Assessment & Plan:  1. Essential hypertension, benign -Pt's enalapril/HCTZ stopped to see if r/t to pt's cough? -Pt started on losartan-HCTZ 100-25mg  today -Pt told to bring back blood pressure log - losartan-hydrochlorothiazide (HYZAAR) 100-25 MG per tablet; Take 1 tablet by mouth daily.  Dispense: 90 tablet; Refill: [place], FNP

## 2014-06-15 ENCOUNTER — Ambulatory Visit (INDEPENDENT_AMBULATORY_CARE_PROVIDER_SITE_OTHER): Payer: 59 | Admitting: *Deleted

## 2014-06-15 ENCOUNTER — Encounter (INDEPENDENT_AMBULATORY_CARE_PROVIDER_SITE_OTHER): Payer: Self-pay

## 2014-06-15 VITALS — BP 139/83 | HR 62

## 2014-06-15 DIAGNOSIS — I10 Essential (primary) hypertension: Secondary | ICD-10-CM

## 2014-06-15 NOTE — Progress Notes (Signed)
Patient ID: Andrea Ross, female   DOB: 1946-01-17, 68 y.o.   MRN: [id number] Patient came in today for BP nurse check. Patients BP 139/83  Pulse 62 this was on the left arm and then her right arm was 113/ Pulse 59. Patient states that at home she gets a big difference in arms when checking BP like we did here today also. Patient advised we will send this reading over to christy to review and call her if any changes needed to be made.

## 2014-06-15 NOTE — Patient Instructions (Signed)

## 2014-12-19 ENCOUNTER — Encounter (HOSPITAL_COMMUNITY): Payer: Self-pay | Admitting: *Deleted

## 2014-12-19 ENCOUNTER — Ambulatory Visit (HOSPITAL_COMMUNITY)
Admission: RE | Admit: 2014-12-19 | Discharge: 2014-12-19 | Disposition: A | Discharge status: Home / Self Care | Payer: 59 | Source: Ambulatory Visit | Attending: Ophthalmology | Admitting: Ophthalmology

## 2014-12-19 ENCOUNTER — Encounter (HOSPITAL_COMMUNITY)
Admission: RE | Disposition: A | Discharge status: Home / Self Care | Payer: Self-pay | Source: Ambulatory Visit | Attending: Ophthalmology

## 2014-12-19 DIAGNOSIS — H409 Unspecified glaucoma: Secondary | ICD-10-CM | POA: Diagnosis present

## 2014-12-19 HISTORY — PX: SLT LASER APPLICATION: SHX6099

## 2014-12-19 SURGERY — SLT LASER APPLICATION
Anesthesia: LOCAL | Laterality: Right

## 2014-12-19 MED ORDER — PILOCARPINE HCL 1 % OP SOLN
OPHTHALMIC | Status: AC
  Filled 2014-12-19: qty 15

## 2014-12-19 MED ORDER — TETRACAINE HCL 0.5 % OP SOLN
OPHTHALMIC | Status: AC
  Filled 2014-12-19: qty 2

## 2014-12-19 MED ORDER — PILOCARPINE HCL 1 % OP SOLN
2.0000 [drp] | Freq: Once | OPHTHALMIC | Status: AC
  Administered 2014-12-19: 2 [drp] via OPHTHALMIC

## 2014-12-19 MED ORDER — APRACLONIDINE HCL 1 % OP SOLN
OPHTHALMIC | Status: AC
  Filled 2014-12-19: qty 0.1

## 2014-12-19 MED ORDER — APRACLONIDINE HCL 1 % OP SOLN
1.0000 [drp] | OPHTHALMIC | Status: AC
  Administered 2014-12-19 (×3): 1 [drp] via OPHTHALMIC

## 2014-12-19 MED ORDER — TETRACAINE HCL 0.5 % OP SOLN
1.0000 [drp] | Freq: Once | OPHTHALMIC | Status: AC
  Administered 2014-12-19: 1 [drp] via OPHTHALMIC

## 2014-12-19 NOTE — Brief Op Note (Signed)
Andrea Ross 12/19/2014  Williams Che, MD  Pre-op Diagnosis:  uncontrolled glaucoma  Post-op Diagnosis:   same  Yag laser self-test completed: Yes.    Indications:  See scanned office H&P  Procedure: SLT OS  Eye protection worn by staff:  Yes.   Laser In Use sign on door:  Yes.    Laser:  {LUMENIS selecta duet  Power Setting:  0.9-0.7 mJ/burst Anatomical site treated:  Trabecular meshwork 360 degrees Number of applications:  92 Total energy delivered: 67.03 mJ Results:  Treated successfully;  Post procedure IOP 18 mmHg.  The patient was discharged home with instructions to continue all her current glaucoma medications in the un-operated eye, and discontinue all her current glaucoma medications, if any.  Patient was instructed to go to the office, as previously scheduled, for intraocular pressure:  Yes.    Patient verbalizes understanding of discharge instructions:  Yes.    Notes:  Gomoscopy:  Grade:   4+ Open    Pigmentation:  Heavy pigmentation     Synechiae:  none    Angle ressessions :  Superior nasal quadrant    Other:  No complications, pt tolerated procedure well.

## 2014-12-19 NOTE — Discharge Instructions (Signed)
Astrid Michaels  12/19/2014     Instructions    Activity: No Restrictions.   Diet: Resume Diet you were on at home.   Pain Medication: Tylenol if Needed.   CONTACT YOUR DOCTOR IF YOU HAVE PAIN, REDNESS IN YOUR EYE, OR DECREASED VISION.   Follow-up:in the next few days with Williams Che, MD.   Dr. Gershon Crane: [phone]  Dr. Iona HansenJI:[id number]  Dr. Geoffry ParadiseID:[id number]   If you find that you cannot contact your physician, but feel that your signs and   Symptoms warrant a physician's attention, call the Emergency Room at   [phone] [phone].   Othern/a.

## 2014-12-19 NOTE — H&P (Signed)
I have reviewed the pre printed H&P, the patient was re-examined, and I have identified no significant interval changes in the patient's medical condition.  There is no change in the plan of care since the history and physical of record. 

## 2014-12-21 ENCOUNTER — Encounter (HOSPITAL_COMMUNITY): Payer: Self-pay | Admitting: Ophthalmology

## 2015-04-11 ENCOUNTER — Encounter: Payer: Self-pay | Admitting: Family

## 2015-04-11 ENCOUNTER — Ambulatory Visit (INDEPENDENT_AMBULATORY_CARE_PROVIDER_SITE_OTHER): Payer: 59 | Admitting: Family

## 2015-04-11 VITALS — BP 158/95 | HR 83 | Temp 97.5°F | Ht 64.5 in | Wt 159.4 lb

## 2015-04-11 DIAGNOSIS — Z23 Encounter for immunization: Secondary | ICD-10-CM | POA: Diagnosis not present

## 2015-04-11 DIAGNOSIS — I10 Essential (primary) hypertension: Secondary | ICD-10-CM | POA: Diagnosis not present

## 2015-04-11 DIAGNOSIS — E559 Vitamin D deficiency, unspecified: Secondary | ICD-10-CM | POA: Diagnosis not present

## 2015-04-11 DIAGNOSIS — E785 Hyperlipidemia, unspecified: Secondary | ICD-10-CM

## 2015-04-11 MED ORDER — ATORVASTATIN CALCIUM 40 MG PO TABS: 40.0000 mg | ORAL_TABLET | Freq: Every day | ORAL | Status: DC

## 2015-04-11 MED ORDER — AMLODIPINE BESYLATE 10 MG PO TABS: 10.0000 mg | ORAL_TABLET | Freq: Every day | ORAL | Status: DC

## 2015-04-11 MED ORDER — LOSARTAN POTASSIUM-HCTZ 100-25 MG PO TABS: 1.0000 | ORAL_TABLET | Freq: Every day | ORAL | Status: DC

## 2015-04-11 NOTE — Progress Notes (Signed)
Subjective:    Patient ID: Andrea Ross, female    DOB: 19-May-1946, 69 y.o.   MRN: [id number]  Pt presents to the office today for chronic follow up.  Hypertension This is a chronic problem. The current episode started more than 1 year ago. The problem has been waxing and waning since onset. The problem is uncontrolled. Pertinent negatives include no anxiety, headaches, palpitations, peripheral edema or shortness of breath. Risk factors for coronary artery disease include dyslipidemia, obesity, post-menopausal state, sedentary lifestyle and family history. Past treatments include ACE inhibitors and diuretics. The current treatment provides mild improvement. Hypertensive end-organ damage includes CVA. There is no history of kidney disease, CAD/MI, heart failure or a thyroid problem. There is no history of sleep apnea.  Hyperlipidemia This is a chronic problem. The current episode started more than 1 year ago. The problem is uncontrolled. Recent lipid tests were reviewed and are high. She has no history of diabetes. Pertinent negatives include no shortness of breath. Current antihyperlipidemic treatment includes statins. The current treatment provides moderate improvement of lipids. Risk factors for coronary artery disease include dyslipidemia, family history, hypertension, obesity, post-menopausal and a sedentary lifestyle.      Review of Systems  Constitutional: Negative.   HENT: Negative.   Eyes: Negative.   Respiratory: Negative.  Negative for shortness of breath.   Cardiovascular: Negative.  Negative for palpitations.  Gastrointestinal: Negative.   Endocrine: Negative.   Genitourinary: Negative.   Musculoskeletal: Negative.   Neurological: Negative.  Negative for headaches.  Hematological: Negative.   Psychiatric/Behavioral: Negative.   All other systems reviewed and are negative.      Objective:   Physical Exam  Constitutional: She is oriented to person, place, and time. She  appears well-developed and well-nourished. No distress.  HENT:  Head: Normocephalic and atraumatic.  Right Ear: External ear normal.  Left Ear: External ear normal.  Nose: Nose normal.  Mouth/Throat: Oropharynx is clear and moist.  Eyes: Pupils are equal, round, and reactive to light.  Neck: Normal range of motion. Neck supple. No thyromegaly present.  Cardiovascular: Normal rate, regular rhythm, normal heart sounds and intact distal pulses.   No murmur heard. Pulmonary/Chest: Effort normal and breath sounds normal. No respiratory distress. She has no wheezes.  Abdominal: Soft. Bowel sounds are normal. She exhibits no distension. There is no tenderness.  Musculoskeletal: Normal range of motion. She exhibits no edema or tenderness.  Neurological: She is alert and oriented to person, place, and time. She has normal reflexes. No cranial nerve deficit.  Skin: Skin is warm and dry.  Psychiatric: She has a normal mood and affect. Her behavior is normal. Judgment and thought content normal.  Vitals reviewed.   BP 158/95 mmHg  Pulse 83  Temp(Src) 97.5 F (36.4 C) (Oral)  Ht 5' 4.5" (1.638 m)  Wt 159 lb 6.4 oz (72.303 kg)  BMI 26.95 kg/m2       Assessment & Plan:  1. Hyperlipidemia - CMP14+EGFR - Lipid panel  2. Essential hypertension, benign -Pt restarted on Norvasc 10 mg today -Pt to continue with losartan-hctz  100-25 mg -Dash diet information given -Exercise encouraged - Stress Management  -Continue current meds -RTO in 2 weeks  CMP14+EGFR - amLODipine (NORVASC) 10 MG tablet; Take 1 tablet (10 mg total) by mouth daily.  Dispense: 90 tablet; Refill: 3  3. Vitamin D deficiency - CMP14+EGFR - Vit D  25 hydroxy (rtn osteoporosis monitoring)   Continue all meds Labs pending Health Maintenance reviewed Diet and  exercise encouraged RTO 2 weeks to recheck HTN  Evelina Dun, FNP

## 2015-04-11 NOTE — Addendum Note (Signed)
Addended by: Shelbie Ammons on: 04/11/2015 08:58 AM   Modules accepted: Orders

## 2015-04-11 NOTE — Patient Instructions (Signed)

## 2015-04-12 LAB — CMP14+EGFR
ALBUMIN: 4.3 g/dL (ref 3.5–4.8)
ALT: 12 IU/L (ref 0–32)
AST: 13 IU/L (ref 0–40)
Albumin/Globulin Ratio: 1.5 (ref 1.1–2.5)
Alkaline Phosphatase: 122 IU/L — ABNORMAL HIGH (ref 39–117)
BUN/Creatinine Ratio: 10 — ABNORMAL LOW (ref 11–26)
BUN: 13 mg/dL (ref 8–27)
Bilirubin Total: 0.3 mg/dL (ref 0.0–1.2)
CO2: 23 mmol/L (ref 18–29)
CREATININE: 1.32 mg/dL — AB (ref 0.57–1.00)
Calcium: 9.6 mg/dL (ref 8.7–10.3)
Chloride: 102 mmol/L (ref 97–108)
GFR, EST AFRICAN AMERICAN: 47 mL/min/{1.73_m2} — AB (ref >59)
GFR, EST NON AFRICAN AMERICAN: 41 mL/min/{1.73_m2} — AB (ref >59)
GLOBULIN, TOTAL: 2.9 g/dL (ref 1.5–4.5)
Glucose: 102 mg/dL — ABNORMAL HIGH (ref 65–99)
Potassium: 4.3 mmol/L (ref 3.5–5.2)
Sodium: 141 mmol/L (ref 134–144)
TOTAL PROTEIN: 7.2 g/dL (ref 6.0–8.5)

## 2015-04-12 LAB — LIPID PANEL
Chol/HDL Ratio: 5.5 ratio units — ABNORMAL HIGH (ref 0.0–4.4)
Cholesterol, Total: 251 mg/dL — ABNORMAL HIGH (ref 100–199)
HDL: 46 mg/dL (ref >39)
LDL CALC: 181 mg/dL — AB (ref 0–99)
Triglycerides: 120 mg/dL (ref 0–149)
VLDL Cholesterol Cal: 24 mg/dL (ref 5–40)

## 2015-04-12 LAB — VITAMIN D 25 HYDROXY (VIT D DEFICIENCY, FRACTURES): Vit D, 25-Hydroxy: 18.9 ng/mL — ABNORMAL LOW (ref 30.0–100.0)

## 2015-04-13 ENCOUNTER — Other Ambulatory Visit: Payer: Self-pay | Admitting: Family

## 2015-04-13 MED ORDER — VITAMIN D (ERGOCALCIFEROL) 1.25 MG (50000 UNIT) PO CAPS: 50000.0000 [IU] | ORAL_CAPSULE | ORAL | Status: DC

## 2015-05-03 ENCOUNTER — Ambulatory Visit: Payer: 59 | Admitting: Family

## 2015-05-04 ENCOUNTER — Encounter: Payer: Self-pay | Admitting: *Deleted

## 2015-05-23 ENCOUNTER — Ambulatory Visit (INDEPENDENT_AMBULATORY_CARE_PROVIDER_SITE_OTHER): Payer: 59 | Admitting: Family

## 2015-05-23 ENCOUNTER — Encounter: Payer: Self-pay | Admitting: Family

## 2015-05-23 VITALS — BP 143/87 | HR 78 | Temp 97.8°F | Ht 64.5 in | Wt 156.0 lb

## 2015-05-23 DIAGNOSIS — I10 Essential (primary) hypertension: Secondary | ICD-10-CM | POA: Diagnosis not present

## 2015-05-23 DIAGNOSIS — R0989 Other specified symptoms and signs involving the circulatory and respiratory systems: Secondary | ICD-10-CM | POA: Diagnosis not present

## 2015-05-23 DIAGNOSIS — IMO0001 Reserved for inherently not codable concepts without codable children: Secondary | ICD-10-CM

## 2015-05-23 NOTE — Addendum Note (Signed)
Addended by: Earlene Plater on: 05/23/2015 02:36 PM   Modules accepted: Miquel Dunn

## 2015-05-23 NOTE — Progress Notes (Signed)
   Subjective:    Patient ID: Andrea Ross, female    DOB: 1946-08-18, 69 y.o.   MRN: [id number]  Pt presents to the office today to recheck HTN. PT's BP is not at goal today HPI    Review of Systems  Constitutional: Negative.   HENT: Negative.   Eyes: Negative.   Respiratory: Negative.  Negative for shortness of breath.   Cardiovascular: Negative.  Negative for palpitations.  Gastrointestinal: Negative.   Endocrine: Negative.   Genitourinary: Negative.   Musculoskeletal: Negative.   Neurological: Negative.  Negative for headaches.  Hematological: Negative.   Psychiatric/Behavioral: Negative.   All other systems reviewed and are negative.      Objective:   Physical Exam  Constitutional: She is oriented to person, place, and time. She appears well-developed and well-nourished. No distress.  HENT:  Head: Normocephalic and atraumatic.  Eyes: Pupils are equal, round, and reactive to light.  Neck: Normal range of motion. Neck supple. No thyromegaly present.  Cardiovascular: Normal rate, regular rhythm, normal heart sounds and intact distal pulses.   No murmur heard. Pulmonary/Chest: Effort normal and breath sounds normal. No respiratory distress. She has no wheezes.  Abdominal: Soft. Bowel sounds are normal. She exhibits no distension. There is no tenderness.  Musculoskeletal: Normal range of motion. She exhibits no edema or tenderness.  Neurological: She is alert and oriented to person, place, and time. She has normal reflexes. No cranial nerve deficit.  Skin: Skin is warm and dry.  Psychiatric: She has a normal mood and affect. Her behavior is normal. Judgment and thought content normal.  Vitals reviewed.   BP 135/94 mmHg  Pulse 78  Temp(Src) 97.8 F (36.6 C) (Oral)  Ht 5' 4.5" (1.638 m)  Wt 156 lb (70.761 kg)  BMI 26.37 kg/m2       Assessment & Plan:  1. Essential hypertension, benign -Daily blood pressure log given with instructions on how to fill out and told to  bring to next visit -Dash diet information given -Exercise encouraged - Stress Management  -Continue current meds -RTO in 3 months - BMP8+EGFR - Ambulatory referral to Cardiology  2. Differential blood pressure between extremities 30+ different of systolic BP from left arm to right arm - Ambulatory referral to Cardiology   Evelina Dun, FNP

## 2015-05-23 NOTE — Patient Instructions (Signed)
DASH Eating Plan DASH stands for "Dietary Approaches to Stop Hypertension." The DASH eating plan is a healthy eating plan that has been shown to reduce high blood pressure (hypertension). Additional health benefits may include reducing the risk of type 2 diabetes mellitus, heart disease, and stroke. The DASH eating plan may also help with weight loss. WHAT DO I NEED TO KNOW ABOUT THE DASH EATING PLAN? For the DASH eating plan, you will follow these general guidelines:  Choose foods with a percent daily value for sodium of less than 5% (as listed on the food label).  Use salt-free seasonings or herbs instead of table salt or sea salt.  Check with your health care provider or pharmacist before using salt substitutes.  Eat lower-sodium products, often labeled as "lower sodium" or "no salt added."  Eat fresh foods.  Eat more vegetables, fruits, and low-fat dairy products.  Choose whole grains. Look for the word "whole" as the first word in the ingredient list.  Choose fish and skinless chicken or turkey more often than red meat. Limit fish, poultry, and meat to 6 oz (170 g) each day.  Limit sweets, desserts, sugars, and sugary drinks.  Choose heart-healthy fats.  Limit cheese to 1 oz (28 g) per day.  Eat more home-cooked food and less restaurant, buffet, and fast food.  Limit fried foods.  Cook foods using methods other than frying.  Limit canned vegetables. If you do use them, rinse them well to decrease the sodium.  When eating at a restaurant, ask that your food be prepared with less salt, or no salt if possible. WHAT FOODS CAN I EAT? Seek help from a dietitian for individual calorie needs. Grains Whole grain or whole wheat bread. Brown rice. Whole grain or whole wheat pasta. Quinoa, bulgur, and whole grain cereals. Low-sodium cereals. Corn or whole wheat flour tortillas. Whole grain cornbread. Whole grain crackers. Low-sodium crackers. Vegetables Fresh or frozen vegetables  (raw, steamed, roasted, or grilled). Low-sodium or reduced-sodium tomato and vegetable juices. Low-sodium or reduced-sodium tomato sauce and paste. Low-sodium or reduced-sodium canned vegetables.  Fruits All fresh, canned (in natural juice), or frozen fruits. Meat and Other Protein Products Ground beef (85% or leaner), grass-fed beef, or beef trimmed of fat. Skinless chicken or turkey. Ground chicken or turkey. Pork trimmed of fat. All fish and seafood. Eggs. Dried beans, peas, or lentils. Unsalted nuts and seeds. Unsalted canned beans. Dairy Low-fat dairy products, such as skim or 1% milk, 2% or reduced-fat cheeses, low-fat ricotta or cottage cheese, or plain low-fat yogurt. Low-sodium or reduced-sodium cheeses. Fats and Oils Tub margarines without trans fats. Light or reduced-fat mayonnaise and salad dressings (reduced sodium). Avocado. Safflower, olive, or canola oils. Natural peanut or almond butter. Other Unsalted popcorn and pretzels. The items listed above may not be a complete list of recommended foods or beverages. Contact your dietitian for more options. WHAT FOODS ARE NOT RECOMMENDED? Grains White bread. White pasta. White rice. Refined cornbread. Bagels and croissants. Crackers that contain trans fat. Vegetables Creamed or fried vegetables. Vegetables in a cheese sauce. Regular canned vegetables. Regular canned tomato sauce and paste. Regular tomato and vegetable juices. Fruits Dried fruits. Canned fruit in light or heavy syrup. Fruit juice. Meat and Other Protein Products Fatty cuts of meat. Ribs, chicken wings, bacon, sausage, bologna, salami, chitterlings, fatback, hot dogs, bratwurst, and packaged luncheon meats. Salted nuts and seeds. Canned beans with salt. Dairy Whole or 2% milk, cream, half-and-half, and cream cheese. Whole-fat or sweetened yogurt. Full-fat   cheeses or blue cheese. Nondairy creamers and whipped toppings. Processed cheese, cheese spreads, or cheese  curds. Condiments Onion and garlic salt, seasoned salt, table salt, and sea salt. Canned and packaged gravies. Worcestershire sauce. Tartar sauce. Barbecue sauce. Teriyaki sauce. Soy sauce, including reduced sodium. Steak sauce. Fish sauce. Oyster sauce. Cocktail sauce. Horseradish. Ketchup and mustard. Meat flavorings and tenderizers. Bouillon cubes. Hot sauce. Tabasco sauce. Marinades. Taco seasonings. Relishes. Fats and Oils Butter, stick margarine, lard, shortening, ghee, and bacon fat. Coconut, palm kernel, or palm oils. Regular salad dressings. Other Pickles and olives. Salted popcorn and pretzels. The items listed above may not be a complete list of foods and beverages to avoid. Contact your dietitian for more information. WHERE CAN I FIND MORE INFORMATION? National Heart, Lung, and [employer] Institute: www.nhlbi.nih.gov/health/health-topics/topics/dash/ Document Released: 08/08/2011 Document Revised: 01/03/2014 Document Reviewed: 06/23/2013 [employer] Patient Information 2015 [employer]. This information is not intended to replace advice given to you by your health care provider. Make sure you discuss any questions you have with your health care provider. Hypertension Hypertension, commonly called high blood pressure, is when the force of blood pumping through your arteries is too strong. Your arteries are the blood vessels that carry blood from your heart throughout your body. A blood pressure reading consists of a higher number over a lower number, such as 110/72. The higher number (systolic) is the pressure inside your arteries when your heart pumps. The lower number (diastolic) is the pressure inside your arteries when your heart relaxes. Ideally you want your blood pressure below 120/80. Hypertension forces your heart to work harder to pump blood. Your arteries may become narrow or stiff. Having hypertension puts you at risk for heart disease, stroke, and other problems.  RISK  FACTORS Some risk factors for high blood pressure are controllable. Others are not.  Risk factors you cannot control include:   Race. You may be at higher risk if you are African American.  Age. Risk increases with age.  Gender. Men are at higher risk than women before age 45 years. After age 65, women are at higher risk than men. Risk factors you can control include:  Not getting enough exercise or physical activity.  Being overweight.  Getting too much fat, sugar, calories, or salt in your diet.  Drinking too much alcohol. SIGNS AND SYMPTOMS Hypertension does not usually cause signs or symptoms. Extremely high blood pressure (hypertensive crisis) may cause headache, anxiety, shortness of breath, and nosebleed. DIAGNOSIS  To check if you have hypertension, your health care provider will measure your blood pressure while you are seated, with your arm held at the level of your heart. It should be measured at least twice using the same arm. Certain conditions can cause a difference in blood pressure between your right and left arms. A blood pressure reading that is higher than normal on one occasion does not mean that you need treatment. If one blood pressure reading is high, ask your health care provider about having it checked again. TREATMENT  Treating high blood pressure includes making lifestyle changes and possibly taking medicine. Living a healthy lifestyle can help lower high blood pressure. You may need to change some of your habits. Lifestyle changes may include:  Following the DASH diet. This diet is high in fruits, vegetables, and whole grains. It is low in salt, red meat, and added sugars.  Getting at least 2 hours of brisk physical activity every week.  Losing weight if necessary.  Not smoking.  Limiting   alcoholic beverages.  Learning ways to reduce stress. If lifestyle changes are not enough to get your blood pressure under control, your health care provider may  prescribe medicine. You may need to take more than one. Work closely with your health care provider to understand the risks and benefits. HOME CARE INSTRUCTIONS  Have your blood pressure rechecked as directed by your health care provider.   Take medicines only as directed by your health care provider. Follow the directions carefully. Blood pressure medicines must be taken as prescribed. The medicine does not work as well when you skip doses. Skipping doses also puts you at risk for problems.   Do not smoke.   Monitor your blood pressure at home as directed by your health care provider. SEEK MEDICAL CARE IF:   You think you are having a reaction to medicines taken.  You have recurrent headaches or feel dizzy.  You have swelling in your ankles.  You have trouble with your vision. SEEK IMMEDIATE MEDICAL CARE IF:  You develop a severe headache or confusion.  You have unusual weakness, numbness, or feel faint.  You have severe chest or abdominal pain.  You vomit repeatedly.  You have trouble breathing. MAKE SURE YOU:   Understand these instructions.  Will watch your condition.  Will get help right away if you are not doing well or get worse. Document Released: 08/19/2005 Document Revised: 01/03/2014 Document Reviewed: 06/11/2013 [employer] Patient Information 2015 [employer]. This information is not intended to replace advice given to you by your health care provider. Make sure you discuss any questions you have with your health care provider.  

## 2015-05-24 ENCOUNTER — Ambulatory Visit: Payer: 59 | Admitting: Family

## 2015-05-24 ENCOUNTER — Telehealth: Payer: Self-pay | Admitting: *Deleted

## 2015-05-24 LAB — BMP8+EGFR
BUN / CREAT RATIO: 15 (ref 11–26)
BUN: 20 mg/dL (ref 8–27)
CO2: 24 mmol/L (ref 18–29)
CREATININE: 1.3 mg/dL — AB (ref 0.57–1.00)
Calcium: 9.6 mg/dL (ref 8.7–10.3)
Chloride: 97 mmol/L (ref 97–108)
GFR, EST AFRICAN AMERICAN: 48 mL/min/{1.73_m2} — AB (ref >59)
GFR, EST NON AFRICAN AMERICAN: 41 mL/min/{1.73_m2} — AB (ref >59)
Glucose: 77 mg/dL (ref 65–99)
Potassium: 4 mmol/L (ref 3.5–5.2)
SODIUM: 139 mmol/L (ref 134–144)

## 2015-05-24 NOTE — Telephone Encounter (Signed)
Patient notified of lab results

## 2015-05-25 ENCOUNTER — Ambulatory Visit: Payer: 59 | Admitting: Family

## 2015-06-08 ENCOUNTER — Encounter: Payer: Self-pay | Admitting: Cardiology

## 2015-06-08 ENCOUNTER — Ambulatory Visit (INDEPENDENT_AMBULATORY_CARE_PROVIDER_SITE_OTHER): Payer: 59 | Admitting: Cardiology

## 2015-06-08 VITALS — BP 127/79 | HR 75 | Ht 64.25 in | Wt 154.0 lb

## 2015-06-08 DIAGNOSIS — I10 Essential (primary) hypertension: Secondary | ICD-10-CM

## 2015-06-08 DIAGNOSIS — R0989 Other specified symptoms and signs involving the circulatory and respiratory systems: Secondary | ICD-10-CM

## 2015-06-08 DIAGNOSIS — R011 Cardiac murmur, unspecified: Secondary | ICD-10-CM

## 2015-06-08 MED ORDER — CHLORTHALIDONE 25 MG PO TABS: 25.0000 mg | ORAL_TABLET | Freq: Every day | ORAL | Status: DC

## 2015-06-08 MED ORDER — LOSARTAN POTASSIUM 100 MG PO TABS: 100.0000 mg | ORAL_TABLET | Freq: Every day | ORAL | Status: DC

## 2015-06-08 NOTE — Progress Notes (Signed)
Patient ID: Andrea Ross, female   DOB: 11-03-1945, 69 y.o.   MRN: [id number]     Clinical Summary Andrea Ross is a 69 y.o.female seen today as a new patient for the following medical problems.  1. HTN - long history of HTN, historically has been difficult to control - she also has been noted to have significantly high bp in left arm compared to right, up to 30 mmHg. She denies any arm pain or weakness - reports medication compliance  2. CVA - CVA in 2013 at [facility] in [place] - no residual deficits  Past Medical History  Diagnosis Date  . Hypertension   . Stroke [id number]  . Hyperlipidemia      Allergies  Allergen Reactions  . Penicillins   . Pepperoni [Pickled Meat] Swelling     Current Outpatient Prescriptions  Medication Sig Dispense Refill  . amLODipine (NORVASC) 10 MG tablet Take 1 tablet (10 mg total) by mouth daily. 90 tablet 3  . aspirin 81 MG chewable tablet Chew 81 mg by mouth daily.    Marland Kitchen atorvastatin (LIPITOR) 40 MG tablet Take 1 tablet (40 mg total) by mouth daily. 90 tablet 3  . losartan-hydrochlorothiazide (HYZAAR) 100-25 MG per tablet Take 1 tablet by mouth daily. 90 tablet 3  . Multiple Vitamin (MULTIVITAMIN WITH MINERALS) TABS tablet Take 1 tablet by mouth daily.    . Vitamin D, Ergocalciferol, (DRISDOL) 50000 UNITS CAPS capsule Take 1 capsule (50,000 Units total) by mouth every 7 (seven) days. 12 capsule 3   No current facility-administered medications for this visit.     Past Surgical History  Procedure Laterality Date  . Umbilical hernia repair  1980  . Hiatal hernia repair  2004  . Fibroid tumors  1992  . 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 Slt laser application Right 0000000    Procedure: SLT LASER APPLICATION;  Surgeon: Williams Che, MD;  Location: [facility];  Service: Ophthalmology;  Laterality: Right;     Allergies  Allergen Reactions  . Penicillins   . Pepperoni [Pickled Meat]  Swelling      Family History  Problem Relation Age of Onset  . Diabetes Mother   . Heart disease Mother   . Cancer Father      Social History Andrea Ross reports that she quit smoking about 44 years ago. She does not have any smokeless tobacco history on file. Andrea Ross reports that she does not drink alcohol.   Review of Systems CONSTITUTIONAL: No weight loss, fever, chills, weakness or fatigue.  HEENT: Eyes: No visual loss, blurred vision, double vision or yellow sclerae.No hearing loss, sneezing, congestion, runny nose or sore throat.  SKIN: No rash or itching.  CARDIOVASCULAR: per HPI RESPIRATORY: No shortness of breath, cough or sputum.  GASTROINTESTINAL: No anorexia, nausea, vomiting or diarrhea. No abdominal pain or blood.  GENITOURINARY: No burning on urination, no polyuria NEUROLOGICAL: No headache, dizziness, syncope, paralysis, ataxia, numbness or tingling in the extremities. No change in bowel or bladder control.  MUSCULOSKELETAL: No muscle, back pain, joint pain or stiffness.  LYMPHATICS: No enlarged nodes. No history of splenectomy.  PSYCHIATRIC: No history of depression or anxiety.  ENDOCRINOLOGIC: No reports of sweating, cold or heat intolerance. No polyuria or polydipsia.  Marland Kitchen   Physical Examination Filed Vitals:   06/08/15 1340  BP: 127/79  Pulse: 75   Filed Vitals:   06/08/15 1331  Height: 5' 4.25" (1.632 m)  Weight: 154 lb (69.854 kg)   Right arm 160/90 Left arm 190/90  Gen: resting comfortably, no acute distress HEENT: no scleral icterus, pupils equal round and reactive, no palptable cervical adenopathy,  CV: RRR, 3/6 systolic murmur RUSB, no jvd. Right radial and brachial pulses decreased compare to left upper extremity pulses. Right carotid bruit Resp: Clear to auscultation bilaterally GI: abdomen is soft, non-tender, non-distended, normal bowel sounds, no hepatosplenomegaly MSK: extremities are warm, no edema.  Skin: warm, no rash Neuro:   no focal deficits Psych: appropriate affect     Assessment and Plan  1. HTN - uncontrolled HTN - will stop hyzaar. Start losartan 100mg  daily and chlorthalidone 25mg  daily. She will keep bp log x 2 weeks and submit - differences in bp and pulses on exam suggests right upper extremity arterial stenosis, will order [place] to evaluate for subclavian stenosis.   2. Carotid bruit - obtain carotid US  3. Heart murmur - obtain echo  [facility] Lenis, M.D.

## 2015-06-08 NOTE — Patient Instructions (Signed)
Your physician recommends that you schedule a follow-up appointment in: [place] [address] [place]  Your physician has recommended you make the following change in your medication:   STOP TAKING HYZAAR   START LOSARTAN 100 MG DAILY  START CHLORTHALIDONE 25 MG DAILY  Your physician recommends that you return for lab work BEFORE YOUR NEXT OFFICE VISIT BMP/MG  Your physician has requested that you regularly monitor and record your blood pressure readings at home FOR 2 [place]. Please use the same machine at the same time of day to check your readings and record them to bring to your follow-up visit.  Your physician has requested that you have an echocardiogram. Echocardiography is a painless test that uses sound waves to create images of your heart. It provides your doctor with information about the size and shape of your heart and how well your heart's chambers and valves are working. This procedure takes approximately one hour. There are no restrictions for this procedure.  Your physician has requested that you have a carotid duplex. This test is an ultrasound of the carotid arteries in your neck. It looks at blood flow through these arteries that supply the brain with blood. Allow one hour for this exam. There are no restrictions or special instructions.  Thank you for choosing [facility]!!

## 2015-06-09 ENCOUNTER — Telehealth: Payer: Self-pay | Admitting: Cardiology

## 2015-06-09 NOTE — Telephone Encounter (Signed)
Pt will keep 1 month f/u with Dr. Harl Bowie to review test and have lab work done in December at pcp appt.

## 2015-06-09 NOTE — Telephone Encounter (Signed)
Andrea Ross called stating that she will not see her PCP until December 2016.  States that Dr. Harl Bowie told her to come back after She sees her PCP. Patient wants to know if she should come back in November as Dr. Harl Bowie requested or can she wait until After she sees her PCP.

## 2015-06-20 ENCOUNTER — Other Ambulatory Visit: Payer: Self-pay

## 2015-06-20 ENCOUNTER — Ambulatory Visit (INDEPENDENT_AMBULATORY_CARE_PROVIDER_SITE_OTHER): Payer: 59

## 2015-06-20 DIAGNOSIS — R0989 Other specified symptoms and signs involving the circulatory and respiratory systems: Secondary | ICD-10-CM

## 2015-06-20 DIAGNOSIS — R011 Cardiac murmur, unspecified: Secondary | ICD-10-CM

## 2015-06-22 ENCOUNTER — Telehealth: Payer: Self-pay | Admitting: *Deleted

## 2015-06-22 NOTE — Telephone Encounter (Signed)
-----   Message from [facility] Lenis, MD sent at 06/21/2015  9:35 AM EDT ----- Echo overall looks good. Her heart murmur is created by mild thickening of one of her heart valves, but the valve itself functions normally and it is nothing to be concerned about.  Zandra Abts MD

## 2015-06-22 NOTE — Telephone Encounter (Signed)
Pt aware, routed to pcp, denies symptoms, confirmed 11/11 appt

## 2015-06-22 NOTE — Telephone Encounter (Signed)
-----   Message from [facility] Lenis, MD sent at 06/21/2015  9:33 AM EDT ----- Ultrasound suggests she does have some decreased circulation in the artery going into the right arm, this is the cause of her differences in blood pressure. In the absence of any symptoms this is something that we just monitor at this time.  Zandra Abts MD

## 2015-06-22 NOTE — Telephone Encounter (Signed)
Pt aware, routed to pcp, 11/11 appt

## 2015-07-14 ENCOUNTER — Ambulatory Visit (INDEPENDENT_AMBULATORY_CARE_PROVIDER_SITE_OTHER): Payer: 59 | Admitting: Cardiology

## 2015-07-14 ENCOUNTER — Encounter: Payer: Self-pay | Admitting: Cardiology

## 2015-07-14 VITALS — BP 182/80 | HR 63 | Ht 64.25 in | Wt 155.4 lb

## 2015-07-14 DIAGNOSIS — I10 Essential (primary) hypertension: Secondary | ICD-10-CM | POA: Diagnosis not present

## 2015-07-14 MED ORDER — SPIRONOLACTONE 25 MG PO TABS: 25.0000 mg | ORAL_TABLET | Freq: Every day | ORAL | Status: DC

## 2015-07-14 NOTE — Progress Notes (Signed)
Patient ID: Andrea Ross, female   DOB: [birth year], 69 y.o.   MRN: [id number]     Clinical Summary Andrea Ross is a 69 y.o.female seen today for follow up of the following medical problems.   1. HTN - long history of HTN, historically has been difficult to control - she also has been noted to have significantly high bp in left arm compared to right, up to 30 mmHg. She denies any arm pain or weakness - reports medication compliance - home bp 145-175/80-90s.. Typically takes bp before taking meds.    2. CVA - CVA in 2013 at [facility] in [place] - no residual deficits   3. Right subclavian stenosis - carotid US, though not listed in impressions, in the tech notes indicate elevated velocities in the mid right subclavian artery (distal to the vertebral artery) along with monophasic waveforms in the right brachial artery consistent with subclavian stenosis. The left subclavian and brachial artery findings were normal.  - has had no related symptoms   Past Medical History  Diagnosis Date  . Hypertension   . Stroke [facility]) [id number]  . Hyperlipidemia      Allergies  Allergen Reactions  . Penicillins   . Pepperoni [Pickled Meat] Swelling     Current Outpatient Prescriptions  Medication Sig Dispense Refill  . amLODipine (NORVASC) 10 MG tablet Take 1 tablet (10 mg total) by mouth daily. 90 tablet 3  . aspirin 81 MG chewable tablet Chew 81 mg by mouth daily.    Marland Kitchen atorvastatin (LIPITOR) 40 MG tablet Take 1 tablet (40 mg total) by mouth daily. 90 tablet 3  . chlorthalidone (HYGROTON) 25 MG tablet Take 1 tablet (25 mg total) by mouth daily. 90 tablet 3  . latanoprost (XALATAN) 0.005 % ophthalmic solution Place 1 drop into the left eye at bedtime.    Marland Kitchen losartan (COZAAR) 100 MG tablet Take 1 tablet (100 mg total) by mouth daily. 90 tablet 3  . vitamin B-12 (CYANOCOBALAMIN) 500 MCG tablet Take 1,000 mcg by mouth daily.    . Vitamin D, Ergocalciferol, (DRISDOL) 50000  UNITS CAPS capsule Take 1 capsule (50,000 Units total) by mouth every 7 (seven) days. 12 capsule 3   No current facility-administered medications for this visit.     Past Surgical History  Procedure Laterality Date  . Umbilical hernia repair  1980  . Hiatal hernia repair  2004  . Fibroid tumors  1992  . 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 Slt laser application Right 0000000    Procedure: SLT LASER APPLICATION;  Surgeon: Williams Che, MD;  Location: [facility];  Service: Ophthalmology;  Laterality: Right;     Allergies  Allergen Reactions  . Penicillins   . Pepperoni [Pickled Meat] Swelling      Family History  Problem Relation Age of Onset  . Diabetes Mother   . Heart disease Mother   . Cancer Father      Social History Andrea Ross reports that she quit smoking about 44 years ago. Her smoking use included Cigarettes. She started smoking about 45 years ago. She has a .075 pack-year smoking history. She has never used smokeless tobacco. Andrea Ross reports that she does not drink alcohol.   Review of Systems CONSTITUTIONAL: No weight loss, fever, chills, weakness or fatigue.  HEENT: Eyes: No visual loss, blurred vision, double vision or yellow sclerae.No hearing loss, sneezing, congestion, runny nose or sore throat.  SKIN: No  rash or itching.  CARDIOVASCULAR: per hpi RESPIRATORY: No shortness of breath, cough or sputum.  GASTROINTESTINAL: No anorexia, nausea, vomiting or diarrhea. No abdominal pain or blood.  GENITOURINARY: No burning on urination, no polyuria NEUROLOGICAL: No headache, dizziness, syncope, paralysis, ataxia, numbness or tingling in the extremities. No change in bowel or bladder control.  MUSCULOSKELETAL: No muscle, back pain, joint pain or stiffness.  LYMPHATICS: No enlarged nodes. No history of splenectomy.  PSYCHIATRIC: No history of depression or anxiety.  ENDOCRINOLOGIC: No reports of sweating, cold or heat  intolerance. No polyuria or polydipsia.  Marland Kitchen   Physical Examination Filed Vitals:   07/14/15 1319  BP: 182/80  Pulse: 63   Filed Vitals:   07/14/15 1319  Height: 5' 4.25" (1.632 m)  Weight: 155 lb 6.4 oz (70.489 kg)    Gen: resting comfortably, no acute distress HEENT: no scleral icterus, pupils equal round and reactive, no palptable cervical adenopathy,  CV: RRR, 2/6 systolic murmur RUSB, no jvd Resp: Clear to auscultation bilaterally GI: abdomen is soft, non-tender, non-distended, normal bowel sounds, no hepatosplenomegaly MSK: extremities are warm, no edema.  Skin: warm, no rash Neuro:  no focal deficits Psych: appropriate affect   Diagnostic Studies  06/2015 echo Study Conclusions  - Left ventricle: The cavity size was normal. Wall thickness was increased in a pattern of mild LVH. Systolic function was normal. The estimated ejection fraction was in the range of 60% to 65%. Wall motion was normal; there were no regional wall motion abnormalities. Doppler parameters are consistent with abnormal left ventricular relaxation (grade 1 diastolic dysfunction). - Aortic valve: Mildly calcified annulus. Trileaflet; mildly calcified leaflets. There was trivial regurgitation. - Mitral valve: Calcified annulus. There was trivial regurgitation. - Right atrium: Central venous pressure (est): 3 mm Hg. - Atrial septum: No defect or patent foramen ovale was identified. - Tricuspid valve: There was trivial regurgitation. - Pulmonary arteries: Systolic pressure could not be accurately estimated. - Pericardium, extracardiac: There was no pericardial effusion.  Impressions:  - Mild LVH with LVEF 60-65% and grade 1 diastolic dysfunction. MAC with trivial mitral regurgitation. Sclerotic aortic valve without stenosis, trivial aortic regurgitation noted.   06/2015 Carotid US   Assessment and Plan   1. HTN - uncontrolled HTN resistant HTN - will start aldactone  25mg  daily in setting of resistant HTN. Check BMET along with renin/aldo and TSH level in 2 weeks. Pending bp response may need consideration for renal artery [place], increased risk of renal artery stenosis given her PAD - of note bp's should be checked in left arm only, right arm bp significantly lower due to subclavian stenosis - she will submit bp log  2. Subclavian stenosis - asymptomatic, continue to follow clinically. Manage risk factors.    F/u 1 month  Arnoldo Lenis, M.D

## 2015-07-14 NOTE — Patient Instructions (Signed)
Your physician recommends that you schedule a follow-up appointment in: 1 month with Dr. Harl Bowie  Your physician has recommended you make the following change in your medication:   START SPIROLACTONE 25 [place] physician recommends that you return for lab work in: [place] BMP/TSH/RENIN/ALDO   Your physician has requested that you regularly monitor and record your blood pressure readings at home [place] DR. BRANCH. Please use the same machine at the same time of day to check your readings and record them to bring to your follow-up visit.  Thank you for choosing [facility]!!

## 2015-07-24 ENCOUNTER — Other Ambulatory Visit (INDEPENDENT_AMBULATORY_CARE_PROVIDER_SITE_OTHER): Payer: 59

## 2015-07-24 DIAGNOSIS — I10 Essential (primary) hypertension: Secondary | ICD-10-CM

## 2015-07-24 NOTE — Progress Notes (Signed)
Lab work for Dr Roderic [place] BMP, TSH, ALDOSTERONE WITH RENIN ACTIVITY W/ RATIO  I10

## 2015-07-27 LAB — BMP8+EGFR
BUN / CREAT RATIO: 16 (ref 11–26)
BUN: 26 mg/dL (ref 8–27)
CO2: 25 mmol/L (ref 18–29)
Calcium: 9.6 mg/dL (ref 8.7–10.3)
Chloride: 100 mmol/L (ref 97–106)
Creatinine, Ser: 1.67 mg/dL — ABNORMAL HIGH (ref 0.57–1.00)
GFR calc Af Amer: 35 mL/min/{1.73_m2} — ABNORMAL LOW (ref >59)
GFR calc non Af Amer: 31 mL/min/{1.73_m2} — ABNORMAL LOW (ref >59)
GLUCOSE: 97 mg/dL (ref 65–99)
Potassium: 4.6 mmol/L (ref 3.5–5.2)
SODIUM: 140 mmol/L (ref 136–144)

## 2015-07-27 LAB — TSH: TSH: 2.31 u[IU]/mL (ref 0.450–4.500)

## 2015-07-27 LAB — ALDOSTERONE + RENIN ACTIVITY W/ RATIO
ALDOS/RENIN RATIO: 1.2 (ref 0.0–30.0)
ALDOSTERONE: 12.4 ng/dL (ref 0.0–30.0)
Renin: 10.72 ng/mL/hr

## 2015-07-31 ENCOUNTER — Telehealth: Payer: Self-pay | Admitting: *Deleted

## 2015-07-31 MED ORDER — SPIRONOLACTONE 25 MG PO TABS: 12.5000 mg | ORAL_TABLET | Freq: Every day | ORAL | Status: DC

## 2015-07-31 NOTE — Telephone Encounter (Signed)
-----   Message from Arnoldo Lenis, MD sent at 07/31/2015  4:25 PM EST ----- Kidney function mildly decreased, please decrease aldactone to 12.5mg  daily  Zandra Abts MD

## 2015-07-31 NOTE — Telephone Encounter (Signed)
Pt verbalized understanding of medication, routed to pcp, medication sent to pharmacy

## 2015-08-01 ENCOUNTER — Encounter: Payer: Self-pay | Admitting: *Deleted

## 2015-08-11 ENCOUNTER — Ambulatory Visit (INDEPENDENT_AMBULATORY_CARE_PROVIDER_SITE_OTHER): Payer: 59 | Admitting: Cardiology

## 2015-08-11 ENCOUNTER — Encounter: Payer: Self-pay | Admitting: Cardiology

## 2015-08-11 VITALS — BP 169/77 | HR 69 | Ht 64.25 in | Wt 156.0 lb

## 2015-08-11 DIAGNOSIS — I10 Essential (primary) hypertension: Secondary | ICD-10-CM

## 2015-08-11 NOTE — Patient Instructions (Addendum)
   Lab for BMET, Magnesium - orders given today, she will do at her next primary MD office visit on 08/23/2015.   Office will contact with results via phone or letter.   Your physician has requested that you regularly monitor and record your blood pressure readings at home. Please take your readings approximately 2 hours after medication.  Take readings to family MD on 08/23/2015.   Continue all current medications. Your physician wants you to follow up in: 6 months.  You will receive a reminder letter in the mail one-two months in advance.  If you don't receive a letter, please call our office to schedule the follow up appointment

## 2015-08-11 NOTE — Progress Notes (Signed)
Patient ID: Andrea Ross, female   DOB: 02/23/1946, 69 y.o.   MRN: [id number]     Clinical Summary Andrea Ross is a 69 y.o.female seen today for follow up of the following medical problems.   1. HTN - long history of HTN, historically has been difficult to control - she also has been noted to have significantly high bp in left arm compared to right, up to 30 mmHg. She denies any arm pain or weakness. Carotid US showed right subclavian stenosis.  - reports medication compliance - normal renin/aldo ratio, normal TSH.   - we decreased her aldactone to 12.5mg  daily due to increase in Cr.  - home bp log shows bp's 130-140s/70-80s    Past Medical History  Diagnosis Date  . Hypertension   . Stroke [facility]) [id number]  . Hyperlipidemia      Allergies  Allergen Reactions  . Penicillins   . Pepperoni [Pickled Meat] Swelling     Current Outpatient Prescriptions  Medication Sig Dispense Refill  . amLODipine (NORVASC) 10 MG tablet Take 1 tablet (10 mg total) by mouth daily. 90 tablet 3  . aspirin 81 MG chewable tablet Chew 81 mg by mouth daily.    Marland Kitchen atorvastatin (LIPITOR) 40 MG tablet Take 1 tablet (40 mg total) by mouth daily. 90 tablet 3  . chlorthalidone (HYGROTON) 25 MG tablet Take 1 tablet (25 mg total) by mouth daily. 90 tablet 3  . latanoprost (XALATAN) 0.005 % ophthalmic solution Place 1 drop into the left eye at bedtime.    Marland Kitchen losartan (COZAAR) 100 MG tablet Take 1 tablet (100 mg total) by mouth daily. 90 tablet 3  . spironolactone (ALDACTONE) 25 MG tablet Take 0.5 tablets (12.5 mg total) by mouth daily. 90 tablet 3  . vitamin B-12 (CYANOCOBALAMIN) 500 MCG tablet Take 1,000 mcg by mouth daily.    . Vitamin D, Ergocalciferol, (DRISDOL) 50000 UNITS CAPS capsule Take 1 capsule (50,000 Units total) by mouth every 7 (seven) days. 12 capsule 3   No current facility-administered medications for this visit.     Past Surgical History  Procedure Laterality Date  . Umbilical hernia  repair  1980  . Hiatal hernia repair  2004  . Fibroid tumors  1992  . 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 Slt laser application Right 0000000    Procedure: SLT LASER APPLICATION;  Surgeon: Williams Che, MD;  Location: [facility];  Service: Ophthalmology;  Laterality: Right;     Allergies  Allergen Reactions  . Penicillins   . Pepperoni [Pickled Meat] Swelling      Family History  Problem Relation Age of Onset  . Diabetes Mother   . Heart disease Mother   . Cancer Father      Social History Andrea Ross reports that she quit smoking about 44 years ago. Her smoking use included Cigarettes. She started smoking about 45 years ago. She has a .075 pack-year smoking history. She has never used smokeless tobacco. Andrea Ross reports that she does not drink alcohol.   Review of Systems CONSTITUTIONAL: No weight loss, fever, chills, weakness or fatigue.  HEENT: Eyes: No visual loss, blurred vision, double vision or yellow sclerae.No hearing loss, sneezing, congestion, runny nose or sore throat.  SKIN: No rash or itching.  CARDIOVASCULAR: no chest pain, no palpitations RESPIRATORY: No shortness of breath, cough or sputum.  GASTROINTESTINAL: No anorexia, nausea, vomiting or diarrhea. No abdominal pain or blood.  GENITOURINARY: No burning on  urination, no polyuria NEUROLOGICAL: No headache, dizziness, syncope, paralysis, ataxia, numbness or tingling in the extremities. No change in bowel or bladder control.  MUSCULOSKELETAL: No muscle, back pain, joint pain or stiffness.  LYMPHATICS: No enlarged nodes. No history of splenectomy.  PSYCHIATRIC: No history of depression or anxiety.  ENDOCRINOLOGIC: No reports of sweating, cold or heat intolerance. No polyuria or polydipsia.  Marland Kitchen   Physical Examination Filed Vitals:   08/11/15 1230  BP: 169/77  Pulse: 69   Filed Vitals:   08/11/15 1230  Height: 5' 4.25" (1.632 m)  Weight: 156 lb (70.761 kg)    Gen:  resting comfortably, no acute distress HEENT: no scleral icterus, pupils equal round and reactive, no palptable cervical adenopathy,  CV: RRR, no m/r/g, no jvd Resp: Clear to auscultation bilaterally GI: abdomen is soft, non-tender, non-distended, normal bowel sounds, no hepatosplenomegaly MSK: extremities are warm, no edema.  Skin: warm, no rash Neuro:  no focal deficits Psych: appropriate affect   Diagnostic Studies  06/2015 echo Study Conclusions  - Left ventricle: The cavity size was normal. Wall thickness was increased in a pattern of mild LVH. Systolic function was normal. The estimated ejection fraction was in the range of 60% to 65%. Wall motion was normal; there were no regional wall motion abnormalities. Doppler parameters are consistent with abnormal left ventricular relaxation (grade 1 diastolic dysfunction). - Aortic valve: Mildly calcified annulus. Trileaflet; mildly calcified leaflets. There was trivial regurgitation. - Mitral valve: Calcified annulus. There was trivial regurgitation. - Right atrium: Central venous pressure (est): 3 mm Hg. - Atrial septum: No defect or patent foramen ovale was identified. - Tricuspid valve: There was trivial regurgitation. - Pulmonary arteries: Systolic pressure could not be accurately estimated. - Pericardium, extracardiac: There was no pericardial effusion.  Impressions:  - Mild LVH with LVEF 60-65% and grade 1 diastolic dysfunction. MAC with trivial mitral regurgitation. Sclerotic aortic valve without stenosis, trivial aortic regurgitation noted.   06/2015 Carotid US 1-39% bilateral ICA diseae, right subclavian stenosis  Assessment and Plan    1. HTN - clinic bp elevated, home numbers are at goal. She appears to have an element of white coat HTN - of note bp's should be checked in left arm only, right arm bp significantly lower due to subclavian stenosis - we will continue current meds - repeat labs  since we decreased aldactone  2. Subclavian stenosis - asymptomatic, continue to follow clinically. Manage risk factors.    F/u 6 months    Arnoldo Lenis, M.D.,

## 2015-08-23 ENCOUNTER — Ambulatory Visit (INDEPENDENT_AMBULATORY_CARE_PROVIDER_SITE_OTHER): Payer: 59 | Admitting: Family

## 2015-08-23 ENCOUNTER — Encounter: Payer: Self-pay | Admitting: Family

## 2015-08-23 VITALS — BP 167/83 | HR 62 | Temp 97.7°F | Ht 64.25 in | Wt 153.6 lb

## 2015-08-23 DIAGNOSIS — I10 Essential (primary) hypertension: Secondary | ICD-10-CM | POA: Diagnosis not present

## 2015-08-23 DIAGNOSIS — Z1159 Encounter for screening for other viral diseases: Secondary | ICD-10-CM

## 2015-08-23 DIAGNOSIS — E785 Hyperlipidemia, unspecified: Secondary | ICD-10-CM

## 2015-08-23 DIAGNOSIS — Z1211 Encounter for screening for malignant neoplasm of colon: Secondary | ICD-10-CM | POA: Diagnosis not present

## 2015-08-23 DIAGNOSIS — E559 Vitamin D deficiency, unspecified: Secondary | ICD-10-CM

## 2015-08-23 NOTE — Progress Notes (Signed)
Subjective:    Patient ID: Andrea Ross, female    DOB: 05/22/1946, 69 y.o.   MRN: [id number]  PT presents to the office today for chronic follow up. Pt is followed by Cardiologists every 6 months. 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 He has added aldactone 12.5 mg daily. Pt also has brought in home log with an average of 120's/70's.  Hypertension This is a chronic problem. The current episode started more than 1 year ago. The problem has been waxing and waning since onset. The problem is uncontrolled. Pertinent negatives include no anxiety, headaches, palpitations, peripheral edema or shortness of breath. Risk factors for coronary artery disease include dyslipidemia, obesity, post-menopausal state, sedentary lifestyle and family history. Past treatments include ACE inhibitors and diuretics. The current treatment provides mild improvement. Hypertensive end-organ damage includes CVA. There is no history of kidney disease, CAD/MI, heart failure or a thyroid problem. There is no history of sleep apnea.  Hyperlipidemia This is a chronic problem. The current episode started more than 1 year ago. The problem is uncontrolled. Recent lipid tests were reviewed and are high. She has no history of diabetes. Pertinent negatives include no shortness of breath. Current antihyperlipidemic treatment includes statins. The current treatment provides moderate improvement of lipids. Risk factors for coronary artery disease include dyslipidemia, family history, hypertension, obesity, post-menopausal and a sedentary lifestyle.      Review of Systems  Constitutional: Negative.   HENT: Negative.   Eyes: Negative.   Respiratory: Negative.  Negative for shortness of breath.   Cardiovascular: Negative.  Negative for palpitations.  Gastrointestinal: Negative.   Endocrine: Negative.   Genitourinary: Negative.   Musculoskeletal: Negative.   Neurological:  Negative.  Negative for headaches.  Hematological: Negative.   Psychiatric/Behavioral: Negative.   All other systems reviewed and are negative.      Objective:   Physical Exam  Constitutional: She is oriented to person, place, and time. She appears well-developed and well-nourished. No distress.  HENT:  Head: Normocephalic and atraumatic.  Right Ear: External ear normal.  Left Ear: External ear normal.  Nose: Nose normal.  Mouth/Throat: Oropharynx is clear and moist.  Eyes: Pupils are equal, round, and reactive to light.  Neck: Normal range of motion. Neck supple. No thyromegaly present.  Cardiovascular: Normal rate, regular rhythm, normal heart sounds and intact distal pulses.   No murmur heard. Pulmonary/Chest: Effort normal and breath sounds normal. No respiratory distress. She has no wheezes.  Abdominal: Soft. Bowel sounds are normal. She exhibits no distension. There is no tenderness.  Musculoskeletal: Normal range of motion. She exhibits no edema or tenderness.  Neurological: She is alert and oriented to person, place, and time. She has normal reflexes. No cranial nerve deficit.  Skin: Skin is warm and dry.  Psychiatric: She has a normal mood and affect. Her behavior is normal. Judgment and thought content normal.  Vitals reviewed.    BP 167/83 mmHg  Pulse 62  Temp(Src) 97.7 F (36.5 C) (Oral)  Ht 5' 4.25" (1.632 m)  Wt 153 lb 9.6 oz (69.673 kg)  BMI 26.16 kg/m2      Assessment & Plan:  1. Essential hypertension, benign - CMP14+EGFR  2. Vitamin D deficiency - CMP14+EGFR  3. Hyperlipidemia - CMP14+EGFR - Lipid panel  4. Need for hepatitis C screening test - CMP14+EGFR - Hepatitis C antibody  5. Colon cancer screening - CMP14+EGFR - Fecal occult blood,  imunochemical; Future   Continue all meds Labs pending Health Maintenance reviewed- Pt to schedule mammogram appt Diet and exercise encouraged RTO 6 months  Evelina Dun, FNP

## 2015-08-23 NOTE — Addendum Note (Signed)
Addended by: Pollyann Kennedy F on: 08/23/2015 09:16 AM   Modules accepted: Orders

## 2015-08-23 NOTE — Patient Instructions (Signed)
Health Maintenance, Female Adopting a healthy lifestyle and getting preventive care can go a long way to promote health and wellness. Talk with your health care provider about what schedule of regular examinations is right for you. This is a good chance for you to check in with your provider about disease prevention and staying healthy. In between checkups, there are plenty of things you can do on your own. Experts have done a lot of research about which lifestyle changes and preventive measures are most likely to keep you healthy. Ask your health care provider for more information. WEIGHT AND DIET  Eat a healthy diet  Be sure to include plenty of vegetables, fruits, low-fat dairy products, and lean protein.  Do not eat a lot of foods high in solid fats, added sugars, or salt.  Get regular exercise. This is one of the most important things you can do for your health.  Most adults should exercise for at least 150 minutes each week. The exercise should increase your heart rate and make you sweat (moderate-intensity exercise).  Most adults should also do strengthening exercises at least twice a week. This is in addition to the moderate-intensity exercise.  Maintain a healthy weight  Body mass index (BMI) is a measurement that can be used to identify possible weight problems. It estimates body fat based on height and weight. Your health care provider can help determine your BMI and help you achieve or maintain a healthy weight.  For females 20 years of age and older:   A BMI below 18.5 is considered underweight.  A BMI of 18.5 to 24.9 is normal.  A BMI of 25 to 29.9 is considered overweight.  A BMI of 30 and above is considered obese.  Watch levels of cholesterol and blood lipids  You should start having your blood tested for lipids and cholesterol at 71 years of age, then have this test every 5 years.  You may need to have your cholesterol levels checked more often if:  Your lipid  or cholesterol levels are high.  You are older than 71 years of age.  You are at high risk for heart disease.  CANCER SCREENING   Lung Cancer  Lung cancer screening is recommended for adults 55-80 years old who are at high risk for lung cancer because of a history of smoking.  A yearly low-dose CT scan of the lungs is recommended for people who:  Currently smoke.  Have quit within the past 15 years.  Have at least a 30-pack-year history of smoking. A pack year is smoking an average of one pack of cigarettes a day for 1 year.  Yearly screening should continue until it has been 15 years since you quit.  Yearly screening should stop if you develop a health problem that would prevent you from having lung cancer treatment.  Breast Cancer  Practice breast self-awareness. This means understanding how your breasts normally appear and feel.  It also means doing regular breast self-exams. Let your health care provider know about any changes, no matter how small.  If you are in your 20s or 30s, you should have a clinical breast exam (CBE) by a health care provider every 1-3 years as part of a regular health exam.  If you are 40 or older, have a CBE every year. Also consider having a breast X-ray (mammogram) every year.  If you have a family history of breast cancer, talk to your health care provider about genetic screening.  If you   are at high risk for breast cancer, talk to your health care provider about having an MRI and a mammogram every year.  Breast cancer gene (BRCA) assessment is recommended for women who have family members with BRCA-related cancers. BRCA-related cancers include:  Breast.  Ovarian.  Tubal.  Peritoneal cancers.  Results of the assessment will determine the need for genetic counseling and BRCA1 and BRCA2 testing. Cervical Cancer Your health care provider may recommend that you be screened regularly for cancer of the pelvic organs (ovaries, uterus, and  vagina). This screening involves a pelvic examination, including checking for microscopic changes to the surface of your cervix (Pap test). You may be encouraged to have this screening done every 3 years, beginning at age 21.  For women ages 30-65, health care providers may recommend pelvic exams and Pap testing every 3 years, or they may recommend the Pap and pelvic exam, combined with testing for human papilloma virus (HPV), every 5 years. Some types of HPV increase your risk of cervical cancer. Testing for HPV may also be done on women of any age with unclear Pap test results.  Other health care providers may not recommend any screening for nonpregnant women who are considered low risk for pelvic cancer and who do not have symptoms. Ask your health care provider if a screening pelvic exam is right for you.  If you have had past treatment for cervical cancer or a condition that could lead to cancer, you need Pap tests and screening for cancer for at least 20 years after your treatment. If Pap tests have been discontinued, your risk factors (such as having a new sexual partner) need to be reassessed to determine if screening should resume. Some women have medical problems that increase the chance of getting cervical cancer. In these cases, your health care provider may recommend more frequent screening and Pap tests. Colorectal Cancer  This type of cancer can be detected and often prevented.  Routine colorectal cancer screening usually begins at 71 years of age and continues through 71 years of age.  Your health care provider may recommend screening at an earlier age if you have risk factors for colon cancer.  Your health care provider may also recommend using home test kits to check for hidden blood in the stool.  A small camera at the end of a tube can be used to examine your colon directly (sigmoidoscopy or colonoscopy). This is done to check for the earliest forms of colorectal  cancer.  Routine screening usually begins at age 50.  Direct examination of the colon should be repeated every 5-10 years through 71 years of age. However, you may need to be screened more often if early forms of precancerous polyps or small growths are found. Skin Cancer  Check your skin from head to toe regularly.  Tell your health care provider about any new moles or changes in moles, especially if there is a change in a mole's shape or color.  Also tell your health care provider if you have a mole that is larger than the size of a pencil eraser.  Always use sunscreen. Apply sunscreen liberally and repeatedly throughout the day.  Protect yourself by wearing long sleeves, pants, a wide-brimmed hat, and sunglasses whenever you are outside. HEART DISEASE, DIABETES, AND HIGH BLOOD PRESSURE   High blood pressure causes heart disease and increases the risk of stroke. High blood pressure is more likely to develop in:  People who have blood pressure in the high end   of the normal range (130-139/85-89 mm Hg).  People who are overweight or obese.  People who are African American.  If you are 38-23 years of age, have your blood pressure checked every 3-5 years. If you are 61 years of age or older, have your blood pressure checked every year. You should have your blood pressure measured twice--once when you are at a hospital or clinic, and once when you are not at a hospital or clinic. Record the average of the two measurements. To check your blood pressure when you are not at a hospital or clinic, you can use:  An automated blood pressure machine at a pharmacy.  A home blood pressure monitor.  If you are between 45 years and 39 years old, ask your health care provider if you should take aspirin to prevent strokes.  Have regular diabetes screenings. This involves taking a blood sample to check your fasting blood sugar level.  If you are at a normal weight and have a low risk for diabetes,  have this test once every three years after 71 years of age.  If you are overweight and have a high risk for diabetes, consider being tested at a younger age or more often. PREVENTING INFECTION  Hepatitis B  If you have a higher risk for hepatitis B, you should be screened for this virus. You are considered at high risk for hepatitis B if:  You were born in a country where hepatitis B is common. Ask your health care provider which countries are considered high risk.  Your parents were born in a high-risk country, and you have not been immunized against hepatitis B (hepatitis B vaccine).  You have HIV or AIDS.  You use needles to inject street drugs.  You live with someone who has hepatitis B.  You have had sex with someone who has hepatitis B.  You get hemodialysis treatment.  You take certain medicines for conditions, including cancer, organ transplantation, and autoimmune conditions. Hepatitis C  Blood testing is recommended for:  Everyone born from [birth year] through 1967.  Anyone with known risk factors for hepatitis C. Sexually transmitted infections (STIs)  You should be screened for sexually transmitted infections (STIs) including gonorrhea and chlamydia if:  You are sexually active and are younger than 71 years of age.  You are older than 71 years of age and your health care provider tells you that you are at risk for this type of infection.  Your sexual activity has changed since you were last screened and you are at an increased risk for chlamydia or gonorrhea. Ask your health care provider if you are at risk.  If you do not have HIV, but are at risk, it may be recommended that you take a prescription medicine daily to prevent HIV infection. This is called pre-exposure prophylaxis (PrEP). You are considered at risk if:  You are sexually active and do not regularly use condoms or know the HIV status of your partner(s).  You take drugs by injection.  You are sexually  active with a partner who has HIV. Talk with your health care provider about whether you are at high risk of being infected with HIV. If you choose to begin PrEP, you should first be tested for HIV. You should then be tested every 3 months for as long as you are taking PrEP.  PREGNANCY   If you are premenopausal and you may become pregnant, ask your health care provider about preconception counseling.  If you may  become pregnant, take 400 to 800 micrograms (mcg) of folic acid every day.  If you want to prevent pregnancy, talk to your health care provider about birth control (contraception). OSTEOPOROSIS AND MENOPAUSE   Osteoporosis is a disease in which the bones lose minerals and strength with aging. This can result in serious bone fractures. Your risk for osteoporosis can be identified using a bone density scan.  If you are 61 years of age or older, or if you are at risk for osteoporosis and fractures, ask your health care provider if you should be screened.  Ask your health care provider whether you should take a calcium or vitamin D supplement to lower your risk for osteoporosis.  Menopause may have certain physical symptoms and risks.  Hormone replacement therapy may reduce some of these symptoms and risks. Talk to your health care provider about whether hormone replacement therapy is right for you.  HOME CARE INSTRUCTIONS   Schedule regular health, dental, and eye exams.  Stay current with your immunizations.   Do not use any tobacco products including cigarettes, chewing tobacco, or electronic cigarettes.  If you are pregnant, do not drink alcohol.  If you are breastfeeding, limit how much and how often you drink alcohol.  Limit alcohol intake to no more than 1 drink per day for nonpregnant women. One drink equals 12 ounces of beer, 5 ounces of wine, or 1 ounces of hard liquor.  Do not use street drugs.  Do not share needles.  Ask your health care provider for help if  you need support or information about quitting drugs.  Tell your health care provider if you often feel depressed.  Tell your health care provider if you have ever been abused or do not feel safe at home.   This information is not intended to replace advice given to you by your health care provider. Make sure you discuss any questions you have with your health care provider.   Document Released: 03/04/2011 Document Revised: 09/09/2014 Document Reviewed: 07/21/2013 Elsevier Interactive Patient Education [employer].

## 2015-08-24 LAB — CMP14+EGFR
ALT: 10 IU/L (ref 0–32)
AST: 12 IU/L (ref 0–40)
Albumin/Globulin Ratio: 1.6 (ref 1.1–2.5)
Albumin: 4.4 g/dL (ref 3.5–4.8)
Alkaline Phosphatase: 119 IU/L — ABNORMAL HIGH (ref 39–117)
BUN / CREAT RATIO: 15 (ref 11–26)
BUN: 25 mg/dL (ref 8–27)
Bilirubin Total: 0.5 mg/dL (ref 0.0–1.2)
CALCIUM: 9.8 mg/dL (ref 8.7–10.3)
CO2: 25 mmol/L (ref 18–29)
Chloride: 99 mmol/L (ref 96–106)
Creatinine, Ser: 1.63 mg/dL — ABNORMAL HIGH (ref 0.57–1.00)
GFR calc Af Amer: 36 mL/min/{1.73_m2} — ABNORMAL LOW (ref >59)
GFR, EST NON AFRICAN AMERICAN: 31 mL/min/{1.73_m2} — AB (ref >59)
Globulin, Total: 2.7 g/dL (ref 1.5–4.5)
Glucose: 89 mg/dL (ref 65–99)
POTASSIUM: 4.4 mmol/L (ref 3.5–5.2)
Sodium: 139 mmol/L (ref 134–144)
TOTAL PROTEIN: 7.1 g/dL (ref 6.0–8.5)

## 2015-08-24 LAB — LIPID PANEL
CHOL/HDL RATIO: 4.3 ratio (ref 0.0–4.4)
Cholesterol, Total: 190 mg/dL (ref 100–199)
HDL: 44 mg/dL (ref >39)
LDL Calculated: 128 mg/dL — ABNORMAL HIGH (ref 0–99)
Triglycerides: 91 mg/dL (ref 0–149)
VLDL CHOLESTEROL CAL: 18 mg/dL (ref 5–40)

## 2015-08-24 LAB — HEPATITIS C ANTIBODY

## 2015-08-24 LAB — MAGNESIUM: MAGNESIUM: 2.4 mg/dL — AB (ref 1.6–2.3)

## 2015-10-13 ENCOUNTER — Telehealth: Payer: Self-pay | Admitting: Family

## 2015-10-17 NOTE — Telephone Encounter (Signed)
scheduled

## 2015-12-25 ENCOUNTER — Encounter: Payer: 59 | Admitting: *Deleted

## 2015-12-28 ENCOUNTER — Other Ambulatory Visit: Payer: 59

## 2015-12-28 DIAGNOSIS — Z1212 Encounter for screening for malignant neoplasm of rectum: Secondary | ICD-10-CM

## 2016-01-03 LAB — FECAL OCCULT BLOOD, IMMUNOCHEMICAL: FECAL OCCULT BLD: NEGATIVE

## 2016-02-22 ENCOUNTER — Encounter: Payer: Self-pay | Admitting: Family

## 2016-02-22 ENCOUNTER — Ambulatory Visit (INDEPENDENT_AMBULATORY_CARE_PROVIDER_SITE_OTHER): Payer: 59 | Admitting: Family

## 2016-02-22 VITALS — BP 148/89 | HR 72 | Temp 97.1°F | Ht 64.25 in | Wt 151.0 lb

## 2016-02-22 DIAGNOSIS — Z23 Encounter for immunization: Secondary | ICD-10-CM | POA: Diagnosis not present

## 2016-02-22 DIAGNOSIS — I10 Essential (primary) hypertension: Secondary | ICD-10-CM | POA: Diagnosis not present

## 2016-02-22 DIAGNOSIS — E785 Hyperlipidemia, unspecified: Secondary | ICD-10-CM

## 2016-02-22 DIAGNOSIS — E663 Overweight: Secondary | ICD-10-CM | POA: Diagnosis not present

## 2016-02-22 DIAGNOSIS — E559 Vitamin D deficiency, unspecified: Secondary | ICD-10-CM | POA: Diagnosis not present

## 2016-02-22 NOTE — Patient Instructions (Signed)
Health Maintenance, Female Adopting a healthy lifestyle and getting preventive care can go a long way to promote health and wellness. Talk with your health care provider about what schedule of regular examinations is right for you. This is a good chance for you to check in with your provider about disease prevention and staying healthy. In between checkups, there are plenty of things you can do on your own. Experts have done a lot of research about which lifestyle changes and preventive measures are most likely to keep you healthy. Ask your health care provider for more information. WEIGHT AND DIET  Eat a healthy diet  Be sure to include plenty of vegetables, fruits, low-fat dairy products, and lean protein.  Do not eat a lot of foods high in solid fats, added sugars, or salt.  Get regular exercise. This is one of the most important things you can do for your health.  Most adults should exercise for at least 150 minutes each week. The exercise should increase your heart rate and make you sweat (moderate-intensity exercise).  Most adults should also do strengthening exercises at least twice a week. This is in addition to the moderate-intensity exercise.  Maintain a healthy weight  Body mass index (BMI) is a measurement that can be used to identify possible weight problems. It estimates body fat based on height and weight. Your health care provider can help determine your BMI and help you achieve or maintain a healthy weight.  For females 20 years of age and older:   A BMI below 18.5 is considered underweight.  A BMI of 18.5 to 24.9 is normal.  A BMI of 25 to 29.9 is considered overweight.  A BMI of 30 and above is considered obese.  Watch levels of cholesterol and blood lipids  You should start having your blood tested for lipids and cholesterol at 72 years of age, then have this test every 5 years.  You may need to have your cholesterol levels checked more often if:  Your lipid  or cholesterol levels are high.  You are older than 72 years of age.  You are at high risk for heart disease.  CANCER SCREENING   Lung Cancer  Lung cancer screening is recommended for adults 55-80 years old who are at high risk for lung cancer because of a history of smoking.  A yearly low-dose CT scan of the lungs is recommended for people who:  Currently smoke.  Have quit within the past 15 years.  Have at least a 30-pack-year history of smoking. A pack year is smoking an average of one pack of cigarettes a day for 1 year.  Yearly screening should continue until it has been 15 years since you quit.  Yearly screening should stop if you develop a health problem that would prevent you from having lung cancer treatment.  Breast Cancer  Practice breast self-awareness. This means understanding how your breasts normally appear and feel.  It also means doing regular breast self-exams. Let your health care provider know about any changes, no matter how small.  If you are in your 20s or 30s, you should have a clinical breast exam (CBE) by a health care provider every 1-3 years as part of a regular health exam.  If you are 40 or older, have a CBE every year. Also consider having a breast X-ray (mammogram) every year.  If you have a family history of breast cancer, talk to your health care provider about genetic screening.  If you   are at high risk for breast cancer, talk to your health care provider about having an MRI and a mammogram every year.  Breast cancer gene (BRCA) assessment is recommended for women who have family members with BRCA-related cancers. BRCA-related cancers include:  Breast.  Ovarian.  Tubal.  Peritoneal cancers.  Results of the assessment will determine the need for genetic counseling and BRCA1 and BRCA2 testing. Cervical Cancer Your health care provider may recommend that you be screened regularly for cancer of the pelvic organs (ovaries, uterus, and  vagina). This screening involves a pelvic examination, including checking for microscopic changes to the surface of your cervix (Pap test). You may be encouraged to have this screening done every 3 years, beginning at age 21.  For women ages 30-65, health care providers may recommend pelvic exams and Pap testing every 3 years, or they may recommend the Pap and pelvic exam, combined with testing for human papilloma virus (HPV), every 5 years. Some types of HPV increase your risk of cervical cancer. Testing for HPV may also be done on women of any age with unclear Pap test results.  Other health care providers may not recommend any screening for nonpregnant women who are considered low risk for pelvic cancer and who do not have symptoms. Ask your health care provider if a screening pelvic exam is right for you.  If you have had past treatment for cervical cancer or a condition that could lead to cancer, you need Pap tests and screening for cancer for at least 20 years after your treatment. If Pap tests have been discontinued, your risk factors (such as having a new sexual partner) need to be reassessed to determine if screening should resume. Some women have medical problems that increase the chance of getting cervical cancer. In these cases, your health care provider may recommend more frequent screening and Pap tests. Colorectal Cancer  This type of cancer can be detected and often prevented.  Routine colorectal cancer screening usually begins at 72 years of age and continues through 72 years of age.  Your health care provider may recommend screening at an earlier age if you have risk factors for colon cancer.  Your health care provider may also recommend using home test kits to check for hidden blood in the stool.  A small camera at the end of a tube can be used to examine your colon directly (sigmoidoscopy or colonoscopy). This is done to check for the earliest forms of colorectal  cancer.  Routine screening usually begins at age 50.  Direct examination of the colon should be repeated every 5-10 years through 72 years of age. However, you may need to be screened more often if early forms of precancerous polyps or small growths are found. Skin Cancer  Check your skin from head to toe regularly.  Tell your health care provider about any new moles or changes in moles, especially if there is a change in a mole's shape or color.  Also tell your health care provider if you have a mole that is larger than the size of a pencil eraser.  Always use sunscreen. Apply sunscreen liberally and repeatedly throughout the day.  Protect yourself by wearing long sleeves, pants, a wide-brimmed hat, and sunglasses whenever you are outside. HEART DISEASE, DIABETES, AND HIGH BLOOD PRESSURE   High blood pressure causes heart disease and increases the risk of stroke. High blood pressure is more likely to develop in:  People who have blood pressure in the high end   of the normal range (130-139/85-89 mm Hg).  People who are overweight or obese.  People who are African American.  If you are 38-23 years of age, have your blood pressure checked every 3-5 years. If you are 61 years of age or older, have your blood pressure checked every year. You should have your blood pressure measured twice--once when you are at a hospital or clinic, and once when you are not at a hospital or clinic. Record the average of the two measurements. To check your blood pressure when you are not at a hospital or clinic, you can use:  An automated blood pressure machine at a pharmacy.  A home blood pressure monitor.  If you are between 45 years and 39 years old, ask your health care provider if you should take aspirin to prevent strokes.  Have regular diabetes screenings. This involves taking a blood sample to check your fasting blood sugar level.  If you are at a normal weight and have a low risk for diabetes,  have this test once every three years after 72 years of age.  If you are overweight and have a high risk for diabetes, consider being tested at a younger age or more often. PREVENTING INFECTION  Hepatitis B  If you have a higher risk for hepatitis B, you should be screened for this virus. You are considered at high risk for hepatitis B if:  You were born in a country where hepatitis B is common. Ask your health care provider which countries are considered high risk.  Your parents were born in a high-risk country, and you have not been immunized against hepatitis B (hepatitis B vaccine).  You have HIV or AIDS.  You use needles to inject street drugs.  You live with someone who has hepatitis B.  You have had sex with someone who has hepatitis B.  You get hemodialysis treatment.  You take certain medicines for conditions, including cancer, organ transplantation, and autoimmune conditions. Hepatitis C  Blood testing is recommended for:  Everyone born from [birth year] through 1967.  Anyone with known risk factors for hepatitis C. Sexually transmitted infections (STIs)  You should be screened for sexually transmitted infections (STIs) including gonorrhea and chlamydia if:  You are sexually active and are younger than 72 years of age.  You are older than 72 years of age and your health care provider tells you that you are at risk for this type of infection.  Your sexual activity has changed since you were last screened and you are at an increased risk for chlamydia or gonorrhea. Ask your health care provider if you are at risk.  If you do not have HIV, but are at risk, it may be recommended that you take a prescription medicine daily to prevent HIV infection. This is called pre-exposure prophylaxis (PrEP). You are considered at risk if:  You are sexually active and do not regularly use condoms or know the HIV status of your partner(s).  You take drugs by injection.  You are sexually  active with a partner who has HIV. Talk with your health care provider about whether you are at high risk of being infected with HIV. If you choose to begin PrEP, you should first be tested for HIV. You should then be tested every 3 months for as long as you are taking PrEP.  PREGNANCY   If you are premenopausal and you may become pregnant, ask your health care provider about preconception counseling.  If you may  become pregnant, take 400 to 800 micrograms (mcg) of folic acid every day.  If you want to prevent pregnancy, talk to your health care provider about birth control (contraception). OSTEOPOROSIS AND MENOPAUSE   Osteoporosis is a disease in which the bones lose minerals and strength with aging. This can result in serious bone fractures. Your risk for osteoporosis can be identified using a bone density scan.  If you are 61 years of age or older, or if you are at risk for osteoporosis and fractures, ask your health care provider if you should be screened.  Ask your health care provider whether you should take a calcium or vitamin D supplement to lower your risk for osteoporosis.  Menopause may have certain physical symptoms and risks.  Hormone replacement therapy may reduce some of these symptoms and risks. Talk to your health care provider about whether hormone replacement therapy is right for you.  HOME CARE INSTRUCTIONS   Schedule regular health, dental, and eye exams.  Stay current with your immunizations.   Do not use any tobacco products including cigarettes, chewing tobacco, or electronic cigarettes.  If you are pregnant, do not drink alcohol.  If you are breastfeeding, limit how much and how often you drink alcohol.  Limit alcohol intake to no more than 1 drink per day for nonpregnant women. One drink equals 12 ounces of beer, 5 ounces of wine, or 1 ounces of hard liquor.  Do not use street drugs.  Do not share needles.  Ask your health care provider for help if  you need support or information about quitting drugs.  Tell your health care provider if you often feel depressed.  Tell your health care provider if you have ever been abused or do not feel safe at home.   This information is not intended to replace advice given to you by your health care provider. Make sure you discuss any questions you have with your health care provider.   Document Released: 03/04/2011 Document Revised: 09/09/2014 Document Reviewed: 07/21/2013 Elsevier Interactive Patient Education [employer].

## 2016-02-22 NOTE — Addendum Note (Signed)
Addended by: Shelbie Ammons on: 02/22/2016 09:30 AM   Modules accepted: Orders

## 2016-02-22 NOTE — Progress Notes (Signed)
Subjective:    Patient ID: Andrea Ross, female    DOB: February 27, 1946, 70 y.o.   MRN: [id number]  PT presents to the office today for chronic follow up. Pt is followed by Cardiologists every 6 months. 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 He has added aldactone 12.5 mg daily. Pt also has brought in home log with an average of 120's/70's.  Hypertension This is a chronic problem. The current episode started more than 1 year ago. The problem has been waxing and waning since onset. The problem is uncontrolled. Pertinent negatives include no anxiety, headaches, palpitations, peripheral edema or shortness of breath. Risk factors for coronary artery disease include dyslipidemia, obesity, post-menopausal state, sedentary lifestyle and family history. Past treatments include ACE inhibitors and diuretics. The current treatment provides mild improvement. Hypertensive end-organ damage includes CVA. There is no history of kidney disease, CAD/MI, heart failure or a thyroid problem. There is no history of sleep apnea.  Hyperlipidemia This is a chronic problem. The current episode started more than 1 year ago. The problem is uncontrolled. Recent lipid tests were reviewed and are high. She has no history of diabetes. Pertinent negatives include no shortness of breath. Current antihyperlipidemic treatment includes statins. The current treatment provides moderate improvement of lipids. Risk factors for coronary artery disease include dyslipidemia, family history, hypertension, obesity, post-menopausal and a sedentary lifestyle.      Review of Systems  Constitutional: Negative.   HENT: Negative.   Eyes: Negative.   Respiratory: Negative.  Negative for shortness of breath.   Cardiovascular: Negative.  Negative for palpitations.  Gastrointestinal: Negative.   Endocrine: Negative.   Genitourinary: Negative.   Musculoskeletal: Negative.   Neurological:  Negative.  Negative for headaches.  Hematological: Negative.   Psychiatric/Behavioral: Negative.   All other systems reviewed and are negative.      Objective:   Physical Exam  Constitutional: She is oriented to person, place, and time. She appears well-developed and well-nourished. No distress.  HENT:  Head: Normocephalic and atraumatic.  Right Ear: External ear normal.  Left Ear: External ear normal.  Nose: Nose normal.  Mouth/Throat: Oropharynx is clear and moist.  Eyes: Pupils are equal, round, and reactive to light.  Neck: Normal range of motion. Neck supple. No thyromegaly present.  Cardiovascular: Normal rate, regular rhythm, normal heart sounds and intact distal pulses.   No murmur heard. Pulmonary/Chest: Effort normal and breath sounds normal. No respiratory distress. She has no wheezes.  Abdominal: Soft. Bowel sounds are normal. She exhibits no distension. There is no tenderness.  Musculoskeletal: Normal range of motion. She exhibits no edema or tenderness.  Neurological: She is alert and oriented to person, place, and time. She has normal reflexes. No cranial nerve deficit.  Skin: Skin is warm and dry.  Psychiatric: She has a normal mood and affect. Her behavior is normal. Judgment and thought content normal.  Vitals reviewed.    BP 155/77 mmHg  Pulse 72  Temp(Src) 97.1 F (36.2 C) (Oral)  Ht 5' 4.25" (1.632 m)  Wt 151 lb (68.493 kg)  BMI 25.72 kg/m2      Assessment & Plan:  1. Overweight (BMI 25.0-29.9) - CMP14+EGFR  2. Essential hypertension, benign - CMP14+EGFR  3. Hyperlipidemia - CMP14+EGFR - Lipid panel  4. Vitamin D deficiency - CMP14+EGFR   Continue all meds Labs pending Health Maintenance reviewed- TDAP and Zoster vaccine given today Diet and exercise encouraged  RTO 6 months  Evelina Dun, [place]

## 2016-02-23 LAB — CMP14+EGFR
A/G RATIO: 1.5 (ref 1.2–2.2)
ALT: 14 IU/L (ref 0–32)
AST: 14 IU/L (ref 0–40)
Albumin: 4.3 g/dL (ref 3.5–4.8)
Alkaline Phosphatase: 132 IU/L — ABNORMAL HIGH (ref 39–117)
BUN/Creatinine Ratio: 13 (ref 12–28)
BUN: 21 mg/dL (ref 8–27)
Bilirubin Total: 0.6 mg/dL (ref 0.0–1.2)
CALCIUM: 9.7 mg/dL (ref 8.7–10.3)
CO2: 25 mmol/L (ref 18–29)
Chloride: 96 mmol/L (ref 96–106)
Creatinine, Ser: 1.61 mg/dL — ABNORMAL HIGH (ref 0.57–1.00)
GFR, EST AFRICAN AMERICAN: 37 mL/min/{1.73_m2} — AB (ref >59)
GFR, EST NON AFRICAN AMERICAN: 32 mL/min/{1.73_m2} — AB (ref >59)
Globulin, Total: 2.9 g/dL (ref 1.5–4.5)
Glucose: 88 mg/dL (ref 65–99)
POTASSIUM: 4.5 mmol/L (ref 3.5–5.2)
Sodium: 139 mmol/L (ref 134–144)
TOTAL PROTEIN: 7.2 g/dL (ref 6.0–8.5)

## 2016-02-23 LAB — LIPID PANEL
CHOL/HDL RATIO: 3.3 ratio (ref 0.0–4.4)
Cholesterol, Total: 173 mg/dL (ref 100–199)
HDL: 53 mg/dL (ref >39)
LDL CALC: 101 mg/dL — AB (ref 0–99)
Triglycerides: 93 mg/dL (ref 0–149)
VLDL CHOLESTEROL CAL: 19 mg/dL (ref 5–40)

## 2016-03-30 ENCOUNTER — Other Ambulatory Visit: Payer: Self-pay | Admitting: Family

## 2016-03-30 DIAGNOSIS — E785 Hyperlipidemia, unspecified: Secondary | ICD-10-CM

## 2016-04-28 ENCOUNTER — Other Ambulatory Visit: Payer: Self-pay | Admitting: Family

## 2016-04-28 DIAGNOSIS — I10 Essential (primary) hypertension: Secondary | ICD-10-CM

## 2016-08-15 ENCOUNTER — Other Ambulatory Visit: Payer: Self-pay | Admitting: Family

## 2016-08-15 DIAGNOSIS — I10 Essential (primary) hypertension: Secondary | ICD-10-CM

## 2016-08-22 ENCOUNTER — Ambulatory Visit (INDEPENDENT_AMBULATORY_CARE_PROVIDER_SITE_OTHER): Payer: 59 | Admitting: Family

## 2016-08-22 ENCOUNTER — Encounter: Payer: Self-pay | Admitting: Family

## 2016-08-22 VITALS — BP 142/85 | HR 61 | Temp 97.7°F | Ht 64.25 in | Wt 156.0 lb

## 2016-08-22 DIAGNOSIS — E559 Vitamin D deficiency, unspecified: Secondary | ICD-10-CM | POA: Diagnosis not present

## 2016-08-22 DIAGNOSIS — E785 Hyperlipidemia, unspecified: Secondary | ICD-10-CM | POA: Diagnosis not present

## 2016-08-22 DIAGNOSIS — E663 Overweight: Secondary | ICD-10-CM | POA: Diagnosis not present

## 2016-08-22 DIAGNOSIS — Z23 Encounter for immunization: Secondary | ICD-10-CM

## 2016-08-22 DIAGNOSIS — I10 Essential (primary) hypertension: Secondary | ICD-10-CM | POA: Diagnosis not present

## 2016-08-22 NOTE — Addendum Note (Signed)
Addended by: Shelbie Ammons on: 08/22/2016 10:36 AM   Modules accepted: Orders

## 2016-08-22 NOTE — Progress Notes (Signed)
   Subjective:    Patient ID: Andrea Ross, female    DOB: [birth year], 70 y.o.   MRN: [id number]  PT presents to the office today for chronic follow up. Pt is followed by Cardiologists every year.  Hypertension  This is a chronic problem. The current episode started more than 1 year ago. The problem has been waxing and waning since onset. The problem is uncontrolled. Pertinent negatives include no anxiety, headaches, palpitations, peripheral edema or shortness of breath. Risk factors for coronary artery disease include dyslipidemia, obesity, post-menopausal state, sedentary lifestyle and family history. Past treatments include ACE inhibitors and diuretics. The current treatment provides mild improvement. Hypertensive end-organ damage includes CVA. There is no history of kidney disease, CAD/MI, heart failure or a thyroid problem. There is no history of sleep apnea.  Hyperlipidemia  This is a chronic problem. The current episode started more than 1 year ago. The problem is uncontrolled. Recent lipid tests were reviewed and are high. She has no history of diabetes or obesity. Pertinent negatives include no shortness of breath. Current antihyperlipidemic treatment includes statins. The current treatment provides moderate improvement of lipids. Risk factors for coronary artery disease include dyslipidemia, family history, hypertension, obesity, post-menopausal and a sedentary lifestyle.      Review of Systems  Constitutional: Negative.   HENT: Negative.   Eyes: Negative.   Respiratory: Negative.  Negative for shortness of breath.   Cardiovascular: Negative.  Negative for palpitations.  Gastrointestinal: Negative.   Endocrine: Negative.   Genitourinary: Negative.   Musculoskeletal: Negative.   Neurological: Negative.  Negative for headaches.  Hematological: Negative.   Psychiatric/Behavioral: Negative.   All other systems reviewed and are negative.      Objective:   Physical Exam    Constitutional: She is oriented to person, place, and time. She appears well-developed and well-nourished. No distress.  HENT:  Head: Normocephalic and atraumatic.  Right Ear: External ear normal.  Left Ear: External ear normal.  Nose: Nose normal.  Mouth/Throat: Oropharynx is clear and moist.  Eyes: Pupils are equal, round, and reactive to light.  Neck: Normal range of motion. Neck supple. No thyromegaly present.  Cardiovascular: Normal rate, regular rhythm, normal heart sounds and intact distal pulses.   No murmur heard. Pulmonary/Chest: Effort normal and breath sounds normal. No respiratory distress. She has no wheezes.  Abdominal: Soft. Bowel sounds are normal. She exhibits no distension. There is no tenderness.  Musculoskeletal: Normal range of motion. She exhibits no edema or tenderness.  Neurological: She is alert and oriented to person, place, and time. She has normal reflexes. No cranial nerve deficit.  Skin: Skin is warm and dry.  Psychiatric: She has a normal mood and affect. Her behavior is normal. Judgment and thought content normal.  Vitals reviewed.    BP (!) 166/85   Pulse 61   Temp 97.7 F (36.5 C) (Oral)   Ht 5' 4.25" (1.632 m)   Wt 156 lb (70.8 kg)   BMI 26.57 kg/m       Assessment & Plan:  1. Essential hypertension, benign - CMP14+EGFR  2. Hyperlipidemia, unspecified hyperlipidemia type - CMP14+EGFR - Lipid panel  3. Vitamin D deficiency - CMP14+EGFR - VITAMIN D 25 Hydroxy (Vit-D Deficiency, Fractures)  4. Overweight (BMI 25.0-29.9) - CMP14+EGFR   Continue all meds Labs pending Health Maintenance reviewed Diet and exercise encouraged RTO 6 months  Evelina Dun, FNP

## 2016-08-22 NOTE — Patient Instructions (Signed)

## 2016-08-23 LAB — CMP14+EGFR
A/G RATIO: 1.6 (ref 1.2–2.2)
ALBUMIN: 4.4 g/dL (ref 3.5–4.8)
ALK PHOS: 121 IU/L — AB (ref 39–117)
ALT: 8 IU/L (ref 0–32)
AST: 8 IU/L (ref 0–40)
BUN / CREAT RATIO: 13 (ref 12–28)
BUN: 20 mg/dL (ref 8–27)
Bilirubin Total: 0.5 mg/dL (ref 0.0–1.2)
CO2: 23 mmol/L (ref 18–29)
CREATININE: 1.5 mg/dL — AB (ref 0.57–1.00)
Calcium: 9.7 mg/dL (ref 8.7–10.3)
Chloride: 102 mmol/L (ref 96–106)
GFR calc Af Amer: 40 mL/min/{1.73_m2} — ABNORMAL LOW (ref >59)
GFR, EST NON AFRICAN AMERICAN: 35 mL/min/{1.73_m2} — AB (ref >59)
GLOBULIN, TOTAL: 2.7 g/dL (ref 1.5–4.5)
Glucose: 90 mg/dL (ref 65–99)
POTASSIUM: 4.1 mmol/L (ref 3.5–5.2)
SODIUM: 144 mmol/L (ref 134–144)
Total Protein: 7.1 g/dL (ref 6.0–8.5)

## 2016-08-23 LAB — LIPID PANEL
CHOLESTEROL TOTAL: 169 mg/dL (ref 100–199)
Chol/HDL Ratio: 4.2 ratio units (ref 0.0–4.4)
HDL: 40 mg/dL (ref >39)
LDL Calculated: 108 mg/dL — ABNORMAL HIGH (ref 0–99)
TRIGLYCERIDES: 107 mg/dL (ref 0–149)
VLDL Cholesterol Cal: 21 mg/dL (ref 5–40)

## 2016-08-23 LAB — VITAMIN D 25 HYDROXY (VIT D DEFICIENCY, FRACTURES): Vit D, 25-Hydroxy: 42.8 ng/mL (ref 30.0–100.0)

## 2016-10-02 ENCOUNTER — Other Ambulatory Visit: Payer: Self-pay | Admitting: Family

## 2016-10-02 DIAGNOSIS — E785 Hyperlipidemia, unspecified: Secondary | ICD-10-CM

## 2016-11-13 ENCOUNTER — Other Ambulatory Visit: Payer: Self-pay | Admitting: Pediatrics

## 2016-11-13 DIAGNOSIS — I10 Essential (primary) hypertension: Secondary | ICD-10-CM

## 2016-11-16 ENCOUNTER — Other Ambulatory Visit: Payer: Self-pay | Admitting: Pediatrics

## 2017-01-24 ENCOUNTER — Other Ambulatory Visit: Payer: Self-pay | Admitting: Family

## 2017-01-24 DIAGNOSIS — E785 Hyperlipidemia, unspecified: Secondary | ICD-10-CM

## 2017-02-09 ENCOUNTER — Other Ambulatory Visit: Payer: Self-pay | Admitting: Family

## 2017-02-09 DIAGNOSIS — I10 Essential (primary) hypertension: Secondary | ICD-10-CM

## 2017-02-15 ENCOUNTER — Other Ambulatory Visit: Payer: Self-pay | Admitting: Family

## 2017-02-20 ENCOUNTER — Encounter: Payer: Self-pay | Admitting: Family

## 2017-02-20 ENCOUNTER — Ambulatory Visit (INDEPENDENT_AMBULATORY_CARE_PROVIDER_SITE_OTHER): Payer: 59 | Admitting: Family

## 2017-02-20 VITALS — BP 145/77 | HR 67 | Temp 98.0°F | Ht 64.25 in | Wt 156.0 lb

## 2017-02-20 DIAGNOSIS — E663 Overweight: Secondary | ICD-10-CM | POA: Diagnosis not present

## 2017-02-20 DIAGNOSIS — E785 Hyperlipidemia, unspecified: Secondary | ICD-10-CM

## 2017-02-20 DIAGNOSIS — Z1211 Encounter for screening for malignant neoplasm of colon: Secondary | ICD-10-CM

## 2017-02-20 DIAGNOSIS — E559 Vitamin D deficiency, unspecified: Secondary | ICD-10-CM

## 2017-02-20 DIAGNOSIS — I10 Essential (primary) hypertension: Secondary | ICD-10-CM | POA: Diagnosis not present

## 2017-02-20 NOTE — Progress Notes (Signed)
   Subjective:    Patient ID: Andrea Ross, female    DOB: 07/01/1946, 71 y.o.   MRN: [id number]  PT presents to the office today for chronic follow up. Hypertension  This is a chronic problem. The current episode started more than 1 year ago. The problem has been waxing and waning since onset. The problem is uncontrolled. Pertinent negatives include no malaise/fatigue, peripheral edema or shortness of breath. Risk factors for coronary artery disease include dyslipidemia, family history, post-menopausal state and sedentary lifestyle. The current treatment provides mild improvement. There is no history of kidney disease or heart failure.  Hyperlipidemia  This is a chronic problem. The current episode started more than 1 year ago. The problem is uncontrolled. Recent lipid tests were reviewed and are high. Pertinent negatives include no shortness of breath. Current antihyperlipidemic treatment includes statins. The current treatment provides moderate improvement of lipids. Risk factors for coronary artery disease include dyslipidemia, hypertension, post-menopausal and family history.      Review of Systems  Constitutional: Negative for malaise/fatigue.  Respiratory: Negative for shortness of breath.   All other systems reviewed and are negative.      Objective:   Physical Exam  Constitutional: She is oriented to person, place, and time. She appears well-developed and well-nourished. No distress.  HENT:  Head: Normocephalic and atraumatic.  Right Ear: External ear normal.  Left Ear: External ear normal.  Nose: Nose normal.  Mouth/Throat: Oropharynx is clear and moist.  Eyes: Pupils are equal, round, and reactive to light.  Neck: Normal range of motion. Neck supple. No thyromegaly present.  Cardiovascular: Normal rate, regular rhythm, normal heart sounds and intact distal pulses.   No murmur heard. Pulmonary/Chest: Effort normal and breath sounds normal. No respiratory distress. She has  no wheezes.  Abdominal: Soft. Bowel sounds are normal. She exhibits no distension. There is no tenderness.  Musculoskeletal: Normal range of motion. She exhibits no edema or tenderness.  Neurological: She is alert and oriented to person, place, and time.  Skin: Skin is warm and dry.  Psychiatric: She has a normal mood and affect. Her behavior is normal. Judgment and thought content normal.  Vitals reviewed.     BP (!) 145/77   Pulse 67   Temp 98 F (36.7 C) (Oral)   Ht 5' 4.25" (1.632 m)   Wt 156 lb (70.8 kg)   BMI 26.57 kg/m      Assessment & Plan:  1. Essential hypertension, benign - CMP14+EGFR  2. Hyperlipidemia, unspecified hyperlipidemia type - CMP14+EGFR - Lipid panel  3. Overweight (BMI 25.0-29.9) - CMP14+EGFR  4. Vitamin D deficiency - CMP14+EGFR  5. Colon cancer screening - CMP14+EGFR - Fecal occult blood, imunochemical; Future   Continue all meds Labs pending Health Maintenance reviewed Diet and exercise encouraged RTO 6 months   Evelina Dun, FNP

## 2017-02-20 NOTE — Patient Instructions (Signed)

## 2017-04-14 ENCOUNTER — Other Ambulatory Visit: Payer: Self-pay | Admitting: Family

## 2017-04-14 DIAGNOSIS — E785 Hyperlipidemia, unspecified: Secondary | ICD-10-CM

## 2017-04-17 ENCOUNTER — Other Ambulatory Visit: Payer: 59

## 2017-04-17 DIAGNOSIS — Z1211 Encounter for screening for malignant neoplasm of colon: Secondary | ICD-10-CM

## 2017-04-19 LAB — FECAL OCCULT BLOOD, IMMUNOCHEMICAL: FECAL OCCULT BLD: NEGATIVE

## 2017-05-03 ENCOUNTER — Other Ambulatory Visit: Payer: Self-pay | Admitting: Family

## 2017-05-03 DIAGNOSIS — I10 Essential (primary) hypertension: Secondary | ICD-10-CM

## 2017-05-12 ENCOUNTER — Other Ambulatory Visit: Payer: Self-pay | Admitting: Family

## 2017-06-26 ENCOUNTER — Encounter: Payer: 59 | Admitting: *Deleted

## 2017-07-10 ENCOUNTER — Other Ambulatory Visit: Payer: Self-pay | Admitting: Family

## 2017-07-10 DIAGNOSIS — E785 Hyperlipidemia, unspecified: Secondary | ICD-10-CM

## 2017-08-08 ENCOUNTER — Other Ambulatory Visit: Payer: Self-pay

## 2017-08-08 DIAGNOSIS — E785 Hyperlipidemia, unspecified: Secondary | ICD-10-CM

## 2017-08-08 DIAGNOSIS — I10 Essential (primary) hypertension: Secondary | ICD-10-CM

## 2017-08-09 ENCOUNTER — Other Ambulatory Visit: Payer: Self-pay | Admitting: Family

## 2017-08-09 DIAGNOSIS — I10 Essential (primary) hypertension: Secondary | ICD-10-CM

## 2017-08-09 NOTE — Telephone Encounter (Signed)
Last seen 02/20/2017

## 2017-08-12 ENCOUNTER — Other Ambulatory Visit: Payer: Self-pay | Admitting: *Deleted

## 2017-08-12 DIAGNOSIS — E785 Hyperlipidemia, unspecified: Secondary | ICD-10-CM

## 2017-08-12 MED ORDER — ATORVASTATIN CALCIUM 40 MG PO TABS: 40.0000 mg | ORAL_TABLET | Freq: Every day | ORAL | 0 refills | Status: DC

## 2017-08-20 ENCOUNTER — Encounter: Payer: Self-pay | Admitting: Family

## 2017-08-20 ENCOUNTER — Ambulatory Visit: Payer: POS | Admitting: Family

## 2017-08-20 VITALS — BP 150/74 | HR 54 | Temp 97.7°F | Ht 64.25 in | Wt 154.2 lb

## 2017-08-20 DIAGNOSIS — E663 Overweight: Secondary | ICD-10-CM | POA: Diagnosis not present

## 2017-08-20 DIAGNOSIS — E785 Hyperlipidemia, unspecified: Secondary | ICD-10-CM | POA: Diagnosis not present

## 2017-08-20 DIAGNOSIS — I10 Essential (primary) hypertension: Secondary | ICD-10-CM | POA: Diagnosis not present

## 2017-08-20 DIAGNOSIS — E559 Vitamin D deficiency, unspecified: Secondary | ICD-10-CM

## 2017-08-20 NOTE — Progress Notes (Signed)
   Subjective:    Patient ID: Andrea Ross, female    DOB: [birth year], 71 y.o.   MRN: [id number]  PT presents to the office today for chronic follow up. Hypertension  This is a chronic problem. The current episode started more than 1 year ago. The problem has been waxing and waning since onset. The problem is uncontrolled. Pertinent negatives include no headaches, malaise/fatigue, peripheral edema or shortness of breath. Risk factors for coronary artery disease include dyslipidemia, obesity, post-menopausal state, sedentary lifestyle and family history. The current treatment provides mild improvement. There is no history of kidney disease, CAD/MI or heart failure.  Hyperlipidemia  This is a chronic problem. The current episode started more than 1 year ago. The problem is uncontrolled. Recent lipid tests were reviewed and are high. Pertinent negatives include no shortness of breath. Current antihyperlipidemic treatment includes statins. The current treatment provides moderate improvement of lipids. Risk factors for coronary artery disease include dyslipidemia, family history, hypertension, a sedentary lifestyle and post-menopausal.      Review of Systems  Constitutional: Negative for malaise/fatigue.  Respiratory: Negative for shortness of breath.   Neurological: Negative for headaches.  All other systems reviewed and are negative.      Objective:   Physical Exam  Constitutional: She is oriented to person, place, and time. She appears well-developed and well-nourished. No distress.  HENT:  Head: Normocephalic and atraumatic.  Right Ear: External ear normal.  Left Ear: External ear normal.  Nose: Nose normal.  Mouth/Throat: Oropharynx is clear and moist.  Eyes: Pupils are equal, round, and reactive to light.  Neck: Normal range of motion. Neck supple. No thyromegaly present.  Cardiovascular: Normal rate, regular rhythm, normal heart sounds and intact distal pulses.  No murmur  heard. Pulmonary/Chest: Effort normal and breath sounds normal. No respiratory distress. She has no wheezes.  Abdominal: Soft. Bowel sounds are normal. She exhibits no distension. There is no tenderness.  Musculoskeletal: Normal range of motion. She exhibits no edema or tenderness.  Neurological: She is alert and oriented to person, place, and time.  Skin: Skin is warm and dry.  Psychiatric: She has a normal mood and affect. Her behavior is normal. Judgment and thought content normal.  Vitals reviewed.     BP (!) 150/74   Pulse (!) 54   Temp 97.7 F (36.5 C) (Oral)   Ht 5' 4.25" (1.632 m)   Wt 154 lb 3.2 oz (69.9 kg)   BMI 26.26 kg/m      Assessment & Plan:  1. Essential hypertension, benign  - CMP14+EGFR  2. Hyperlipidemia, unspecified hyperlipidemia type - CMP14+EGFR - Lipid panel  3. Overweight (BMI 25.0-29.9) - CMP14+EGFR  4. Vitamin D deficiency - CMP14+EGFR - VITAMIN D 25 Hydroxy (Vit-D Deficiency, Fractures)   Continue all meds Labs pending Health Maintenance reviewed Diet and exercise encouraged RTO 6 months  Evelina Dun, FNP

## 2017-08-20 NOTE — Patient Instructions (Signed)

## 2017-08-21 ENCOUNTER — Other Ambulatory Visit: Payer: Self-pay | Admitting: Family

## 2017-08-21 LAB — CMP14+EGFR
A/G RATIO: 1.4 (ref 1.2–2.2)
ALBUMIN: 4.2 g/dL (ref 3.5–4.8)
ALK PHOS: 121 IU/L — AB (ref 39–117)
ALT: 13 IU/L (ref 0–32)
AST: 20 IU/L (ref 0–40)
BILIRUBIN TOTAL: 0.4 mg/dL (ref 0.0–1.2)
BUN / CREAT RATIO: 11 — AB (ref 12–28)
BUN: 22 mg/dL (ref 8–27)
CHLORIDE: 102 mmol/L (ref 96–106)
CO2: 24 mmol/L (ref 20–29)
Calcium: 9.3 mg/dL (ref 8.7–10.3)
Creatinine, Ser: 1.99 mg/dL — ABNORMAL HIGH (ref 0.57–1.00)
GFR calc non Af Amer: 24 mL/min/{1.73_m2} — ABNORMAL LOW (ref >59)
GFR, EST AFRICAN AMERICAN: 28 mL/min/{1.73_m2} — AB (ref >59)
GLOBULIN, TOTAL: 2.9 g/dL (ref 1.5–4.5)
GLUCOSE: 77 mg/dL (ref 65–99)
POTASSIUM: 4.5 mmol/L (ref 3.5–5.2)
SODIUM: 142 mmol/L (ref 134–144)
TOTAL PROTEIN: 7.1 g/dL (ref 6.0–8.5)

## 2017-08-21 LAB — LIPID PANEL
CHOLESTEROL TOTAL: 166 mg/dL (ref 100–199)
Chol/HDL Ratio: 4.6 ratio — ABNORMAL HIGH (ref 0.0–4.4)
HDL: 36 mg/dL — ABNORMAL LOW (ref >39)
LDL Calculated: 93 mg/dL (ref 0–99)
Triglycerides: 183 mg/dL — ABNORMAL HIGH (ref 0–149)
VLDL Cholesterol Cal: 37 mg/dL (ref 5–40)

## 2017-08-21 LAB — VITAMIN D 25 HYDROXY (VIT D DEFICIENCY, FRACTURES): VIT D 25 HYDROXY: 37.4 ng/mL (ref 30.0–100.0)

## 2017-08-22 ENCOUNTER — Ambulatory Visit: Payer: 59 | Admitting: Family

## 2017-10-03 ENCOUNTER — Encounter: Payer: Self-pay | Admitting: Family

## 2017-10-03 ENCOUNTER — Ambulatory Visit: Payer: POS | Admitting: Family

## 2017-10-03 VITALS — BP 127/81 | HR 61 | Temp 97.1°F | Ht 64.25 in | Wt 158.2 lb

## 2017-10-03 DIAGNOSIS — I10 Essential (primary) hypertension: Secondary | ICD-10-CM | POA: Diagnosis not present

## 2017-10-03 NOTE — Progress Notes (Signed)
   Subjective:    Patient ID: Andrea Ross, female    DOB: 1946-05-18, 72 y.o.   MRN: [id number]  Pt presents to the office today for recheck of HTN. PT's BP is at goal.  Hypertension  This is a chronic problem. The current episode started more than 1 year ago. The problem has been resolved since onset. The problem is controlled. Pertinent negatives include no malaise/fatigue, peripheral edema or shortness of breath. The current treatment provides moderate improvement. There is no history of kidney disease, CAD/MI, CVA or heart failure.      Review of Systems  Constitutional: Negative for malaise/fatigue.  Respiratory: Negative for shortness of breath.   All other systems reviewed and are negative.      Objective:   Physical Exam  Constitutional: She is oriented to person, place, and time. She appears well-developed and well-nourished. No distress.  HENT:  Head: Normocephalic.  Eyes: Pupils are equal, round, and reactive to light.  Neck: Normal range of motion. Neck supple. No thyromegaly present.  Cardiovascular: Normal rate, regular rhythm, normal heart sounds and intact distal pulses.  No murmur heard. Pulmonary/Chest: Effort normal and breath sounds normal. No respiratory distress. She has no wheezes.  Abdominal: Soft. Bowel sounds are normal. She exhibits no distension. There is no tenderness.  Musculoskeletal: Normal range of motion. She exhibits no edema or tenderness.  Neurological: She is alert and oriented to person, place, and time.  Skin: Skin is warm and dry.  Psychiatric: She has a normal mood and affect. Her behavior is normal. Judgment and thought content normal.  Vitals reviewed.     BP 127/81   Pulse 61   Temp (!) 97.1 F (36.2 C) (Oral)   Ht 5' 4.25" (1.632 m)   Wt 158 lb 3.2 oz (71.8 kg)   BMI 26.94 kg/m      Assessment & Plan:  1. Essential hypertension, benign -Dash diet information given -Exercise encouraged - Stress Management  -Continue  current meds -RTO in 3 months  - BMP8+EGFR    Evelina Dun, FNP

## 2017-10-03 NOTE — Patient Instructions (Signed)

## 2017-10-04 LAB — BMP8+EGFR
BUN/Creatinine Ratio: 11 — ABNORMAL LOW (ref 12–28)
BUN: 17 mg/dL (ref 8–27)
CALCIUM: 9.9 mg/dL (ref 8.7–10.3)
CO2: 23 mmol/L (ref 20–29)
CREATININE: 1.54 mg/dL — AB (ref 0.57–1.00)
Chloride: 103 mmol/L (ref 96–106)
GFR, EST AFRICAN AMERICAN: 38 mL/min/{1.73_m2} — AB (ref >59)
GFR, EST NON AFRICAN AMERICAN: 33 mL/min/{1.73_m2} — AB (ref >59)
Glucose: 66 mg/dL (ref 65–99)
Potassium: 4.8 mmol/L (ref 3.5–5.2)
Sodium: 145 mmol/L — ABNORMAL HIGH (ref 134–144)

## 2017-10-27 ENCOUNTER — Telehealth: Payer: Self-pay | Admitting: Family

## 2017-10-27 DIAGNOSIS — E785 Hyperlipidemia, unspecified: Secondary | ICD-10-CM

## 2017-10-27 DIAGNOSIS — I10 Essential (primary) hypertension: Secondary | ICD-10-CM

## 2017-10-27 MED ORDER — ATORVASTATIN CALCIUM 40 MG PO TABS: 40.0000 mg | ORAL_TABLET | Freq: Every day | ORAL | 0 refills | Status: DC

## 2017-10-27 MED ORDER — AMLODIPINE BESYLATE 10 MG PO TABS: 10.0000 mg | ORAL_TABLET | Freq: Every day | ORAL | 1 refills | Status: DC

## 2017-10-27 MED ORDER — LOSARTAN POTASSIUM-HCTZ 100-25 MG PO TABS: 1.0000 | ORAL_TABLET | Freq: Every day | ORAL | 0 refills | Status: DC

## 2017-10-27 NOTE — Telephone Encounter (Signed)
Please advise on refills.  

## 2017-10-27 NOTE — Telephone Encounter (Signed)
Prescription sent to pharmacy.

## 2018-01-01 ENCOUNTER — Ambulatory Visit: Payer: POS | Admitting: Family

## 2018-01-01 ENCOUNTER — Encounter: Payer: Self-pay | Admitting: Family

## 2018-01-01 VITALS — BP 134/91 | HR 66 | Temp 97.3°F | Ht 64.5 in | Wt 160.2 lb

## 2018-01-01 DIAGNOSIS — E559 Vitamin D deficiency, unspecified: Secondary | ICD-10-CM | POA: Diagnosis not present

## 2018-01-01 DIAGNOSIS — E785 Hyperlipidemia, unspecified: Secondary | ICD-10-CM | POA: Diagnosis not present

## 2018-01-01 DIAGNOSIS — I10 Essential (primary) hypertension: Secondary | ICD-10-CM | POA: Diagnosis not present

## 2018-01-01 DIAGNOSIS — Z8673 Personal history of transient ischemic attack (TIA), and cerebral infarction without residual deficits: Secondary | ICD-10-CM

## 2018-01-01 DIAGNOSIS — E663 Overweight: Secondary | ICD-10-CM | POA: Diagnosis not present

## 2018-01-01 NOTE — Progress Notes (Signed)
   Subjective:    Patient ID: Andrea Ross, female    DOB: Nov 23, 1945, 72 y.o.   MRN: [id number]  Chief Complaint  Patient presents with  . Hypertension    three month trecheck  . Hyperlipidemia   Hypertension  This is a chronic problem. The current episode started more than 1 year ago. The problem has been waxing and waning since onset. The problem is uncontrolled. Pertinent negatives include no headaches, malaise/fatigue, peripheral edema or shortness of breath. Risk factors for coronary artery disease include dyslipidemia and family history. The current treatment provides mild improvement. Hypertensive end-organ damage includes CVA. There is no history of kidney disease, CAD/MI or heart failure. There is no history of chronic renal disease.  Hyperlipidemia  This is a chronic problem. The current episode started more than 1 year ago. The problem is uncontrolled. Recent lipid tests were reviewed and are high. She has no history of chronic renal disease. Pertinent negatives include no shortness of breath. Current antihyperlipidemic treatment includes statins. The current treatment provides moderate improvement of lipids. Risk factors for coronary artery disease include dyslipidemia, family history and post-menopausal.      Review of Systems  Constitutional: Negative for malaise/fatigue.  Respiratory: Negative for shortness of breath.   Neurological: Negative for headaches.  All other systems reviewed and are negative.      Objective:   Physical Exam  Constitutional: She is oriented to person, place, and time. She appears well-developed and well-nourished. No distress.  HENT:  Head: Normocephalic and atraumatic.  Right Ear: External ear normal.  Left Ear: External ear normal.  Mouth/Throat: Oropharynx is clear and moist.  Eyes: Pupils are equal, round, and reactive to light.  Neck: Normal range of motion. Neck supple. No thyromegaly present.  Cardiovascular: Normal rate, regular  rhythm, normal heart sounds and intact distal pulses.  No murmur heard. Pulmonary/Chest: Effort normal and breath sounds normal. No respiratory distress. She has no wheezes.  Abdominal: Soft. Bowel sounds are normal. She exhibits no distension. There is no tenderness.  Musculoskeletal: Normal range of motion. She exhibits no edema or tenderness.  Neurological: She is alert and oriented to person, place, and time. She has normal reflexes.  Skin: Skin is warm and dry.  Psychiatric: She has a normal mood and affect. Her behavior is normal. Judgment and thought content normal.  Vitals reviewed.     BP (!) 158/85   Pulse 71   Temp (!) 97.3 F (36.3 C) (Oral)   Ht 5' 4.5" (1.638 m)   Wt 160 lb 3.2 oz (72.7 kg)   BMI 27.07 kg/m      Assessment & Plan:  [name] was seen today for hypertension and hyperlipidemia.  Diagnoses and all orders for this visit:  Essential hypertension, benign -     CMP14+EGFR  Hyperlipidemia, unspecified hyperlipidemia type -     CMP14+EGFR -     Lipid panel  Vitamin D deficiency -     CMP14+EGFR -     VITAMIN D 25 Hydroxy (Vit-D Deficiency, Fractures)  Overweight (BMI 25.0-29.9) -     CMP14+EGFR  History of cardioembolic cerebrovascular accident (CVA) -     CMP14+EGFR   Labs pending Pt refuses Mammogram and Dexa scan today Continue medications  RTO in 6 months  Evelina Dun, FNP

## 2018-01-01 NOTE — Patient Instructions (Signed)

## 2018-01-02 LAB — LIPID PANEL
CHOL/HDL RATIO: 4 ratio (ref 0.0–4.4)
Cholesterol, Total: 170 mg/dL (ref 100–199)
HDL: 42 mg/dL (ref >39)
LDL Calculated: 106 mg/dL — ABNORMAL HIGH (ref 0–99)
Triglycerides: 112 mg/dL (ref 0–149)
VLDL Cholesterol Cal: 22 mg/dL (ref 5–40)

## 2018-01-02 LAB — CMP14+EGFR
A/G RATIO: 1.4 (ref 1.2–2.2)
ALT: 14 IU/L (ref 0–32)
AST: 19 IU/L (ref 0–40)
Albumin: 4.2 g/dL (ref 3.5–4.8)
Alkaline Phosphatase: 119 IU/L — ABNORMAL HIGH (ref 39–117)
BILIRUBIN TOTAL: 0.4 mg/dL (ref 0.0–1.2)
BUN/Creatinine Ratio: 20 (ref 12–28)
BUN: 32 mg/dL — ABNORMAL HIGH (ref 8–27)
CALCIUM: 9.5 mg/dL (ref 8.7–10.3)
CHLORIDE: 102 mmol/L (ref 96–106)
CO2: 21 mmol/L (ref 20–29)
Creatinine, Ser: 1.61 mg/dL — ABNORMAL HIGH (ref 0.57–1.00)
GFR calc Af Amer: 36 mL/min/{1.73_m2} — ABNORMAL LOW (ref >59)
GFR, EST NON AFRICAN AMERICAN: 31 mL/min/{1.73_m2} — AB (ref >59)
GLOBULIN, TOTAL: 3 g/dL (ref 1.5–4.5)
Glucose: 91 mg/dL (ref 65–99)
POTASSIUM: 4.1 mmol/L (ref 3.5–5.2)
SODIUM: 140 mmol/L (ref 134–144)
TOTAL PROTEIN: 7.2 g/dL (ref 6.0–8.5)

## 2018-01-02 LAB — VITAMIN D 25 HYDROXY (VIT D DEFICIENCY, FRACTURES): VIT D 25 HYDROXY: 42.1 ng/mL (ref 30.0–100.0)

## 2018-01-25 ENCOUNTER — Other Ambulatory Visit: Payer: Self-pay | Admitting: Family

## 2018-01-25 DIAGNOSIS — E785 Hyperlipidemia, unspecified: Secondary | ICD-10-CM

## 2018-02-13 ENCOUNTER — Other Ambulatory Visit: Payer: Self-pay | Admitting: Family

## 2018-02-20 ENCOUNTER — Ambulatory Visit: Payer: POS | Admitting: Family

## 2018-05-15 ENCOUNTER — Other Ambulatory Visit: Payer: Self-pay | Admitting: Family

## 2018-05-15 DIAGNOSIS — I10 Essential (primary) hypertension: Secondary | ICD-10-CM

## 2018-07-06 ENCOUNTER — Ambulatory Visit: Payer: POS | Admitting: Family

## 2018-07-22 ENCOUNTER — Other Ambulatory Visit: Payer: Self-pay | Admitting: Family

## 2018-07-22 DIAGNOSIS — E785 Hyperlipidemia, unspecified: Secondary | ICD-10-CM

## 2018-08-08 ENCOUNTER — Other Ambulatory Visit: Payer: Self-pay | Admitting: Family

## 2018-08-11 ENCOUNTER — Other Ambulatory Visit: Payer: Self-pay | Admitting: Family

## 2018-08-11 DIAGNOSIS — I10 Essential (primary) hypertension: Secondary | ICD-10-CM

## 2018-08-19 ENCOUNTER — Other Ambulatory Visit: Payer: Self-pay | Admitting: Family

## 2018-08-19 DIAGNOSIS — E785 Hyperlipidemia, unspecified: Secondary | ICD-10-CM

## 2018-09-10 ENCOUNTER — Ambulatory Visit: Payer: POS | Admitting: Family

## 2018-09-10 ENCOUNTER — Encounter: Payer: Self-pay | Admitting: Family

## 2018-09-10 VITALS — BP 107/72 | HR 75 | Temp 97.1°F | Ht 64.5 in | Wt 160.4 lb

## 2018-09-10 DIAGNOSIS — E663 Overweight: Secondary | ICD-10-CM

## 2018-09-10 DIAGNOSIS — I10 Essential (primary) hypertension: Secondary | ICD-10-CM | POA: Diagnosis not present

## 2018-09-10 DIAGNOSIS — E785 Hyperlipidemia, unspecified: Secondary | ICD-10-CM

## 2018-09-10 DIAGNOSIS — E559 Vitamin D deficiency, unspecified: Secondary | ICD-10-CM | POA: Diagnosis not present

## 2018-09-10 DIAGNOSIS — Z8673 Personal history of transient ischemic attack (TIA), and cerebral infarction without residual deficits: Secondary | ICD-10-CM

## 2018-09-10 MED ORDER — ATORVASTATIN CALCIUM 40 MG PO TABS: 40.0000 mg | ORAL_TABLET | Freq: Every day | ORAL | 3 refills | Status: DC

## 2018-09-10 MED ORDER — SPIRONOLACTONE 25 MG PO TABS: 12.5000 mg | ORAL_TABLET | Freq: Every day | ORAL | 3 refills | Status: DC

## 2018-09-10 MED ORDER — AMLODIPINE BESYLATE 10 MG PO TABS: 10.0000 mg | ORAL_TABLET | Freq: Every day | ORAL | 3 refills | Status: DC

## 2018-09-10 MED ORDER — LOSARTAN POTASSIUM-HCTZ 100-25 MG PO TABS: 1.0000 | ORAL_TABLET | Freq: Every day | ORAL | 3 refills | Status: DC

## 2018-09-10 NOTE — Progress Notes (Signed)
Subjective:    Patient ID: Andrea Ross, female    DOB: [birth year], 73 y.o.   MRN: [id number]  Chief Complaint  Patient presents with  . Medical Management of Chronic Issues    six month recheck   Pt presents to the office today for chronic follow up. Pt has hx of CVA.  Hypertension  This is a chronic problem. The current episode started more than 1 year ago. The problem has been resolved since onset. The problem is controlled. Pertinent negatives include no headaches, malaise/fatigue, peripheral edema or shortness of breath. Risk factors for coronary artery disease include dyslipidemia. The current treatment provides moderate improvement. Hypertensive end-organ damage includes CAD/MI.  Hyperlipidemia  This is a chronic problem. The current episode started more than 1 year ago. The problem is uncontrolled. Recent lipid tests were reviewed and are high. Pertinent negatives include no shortness of breath. Current antihyperlipidemic treatment includes statins. The current treatment provides moderate improvement of lipids. Risk factors for coronary artery disease include dyslipidemia and a sedentary lifestyle.      Review of Systems  Constitutional: Negative for malaise/fatigue.  Respiratory: Negative for shortness of breath.   Neurological: Negative for headaches.  All other systems reviewed and are negative.      Objective:   Physical Exam Vitals signs reviewed.  Constitutional:      General: She is not in acute distress.    Appearance: She is well-developed.  HENT:     Head: Normocephalic and atraumatic.     Right Ear: Tympanic membrane normal.     Left Ear: Tympanic membrane normal.  Eyes:     Pupils: Pupils are equal, round, and reactive to light.  Neck:     Musculoskeletal: Normal range of motion and neck supple.     Thyroid: No thyromegaly.  Cardiovascular:     Rate and Rhythm: Normal rate and regular rhythm.     Heart sounds: Normal heart sounds. No murmur.    Pulmonary:     Effort: Pulmonary effort is normal. No respiratory distress.     Breath sounds: Normal breath sounds. No wheezing.  Abdominal:     General: Bowel sounds are normal. There is no distension.     Palpations: Abdomen is soft.     Tenderness: There is no abdominal tenderness.  Musculoskeletal: Normal range of motion.        General: No tenderness.  Skin:    General: Skin is warm and dry.  Neurological:     Mental Status: She is alert and oriented to person, place, and time.     Cranial Nerves: No cranial nerve deficit.     Deep Tendon Reflexes: Reflexes are normal and symmetric.  Psychiatric:        Behavior: Behavior normal.        Thought Content: Thought content normal.        Judgment: Judgment normal.       BP 107/72   Pulse 75   Temp (!) 97.1 F (36.2 C) (Oral)   Ht 5' 4.5" (1.638 m)   Wt 160 lb 6.4 oz (72.8 kg)   BMI 27.11 kg/m      Assessment & Plan:  Andrea Ross comes in today with chief complaint of Medical Management of Chronic Issues (six month recheck)   Diagnosis and orders addressed:  1. Essential hypertension, benign - amLODipine (NORVASC) 10 MG tablet; Take 1 tablet (10 mg total) by mouth daily. (Needs to be seen before next refill)  Dispense: 90 tablet; Refill: 3 - losartan-hydrochlorothiazide (HYZAAR) 100-25 MG tablet; Take 1 tablet by mouth daily. (Needs to be seen before next refill)  Dispense: 90 tablet; Refill: 3 - spironolactone (ALDACTONE) 25 MG tablet; Take 0.5 tablets (12.5 mg total) by mouth daily.  Dispense: 90 tablet; Refill: 3 - CMP14+EGFR - CBC with Differential/Platelet  2. Hyperlipidemia - atorvastatin (LIPITOR) 40 MG tablet; Take 1 tablet (40 mg total) by mouth daily.  Dispense: 90 tablet; Refill: 3 - CMP14+EGFR - CBC with Differential/Platelet - Lipid panel  3. Vitamin D deficiency - CMP14+EGFR - CBC with Differential/Platelet  4. Overweight (BMI 25.0-29.9) - CMP14+EGFR - CBC with Differential/Platelet  5.  History of cardioembolic cerebrovascular accident (CVA) - CMP14+EGFR - CBC with Differential/Platelet   Labs pending Health Maintenance reviewed Diet and exercise encouraged  Follow up plan: 6 months   Evelina Dun, FNP

## 2018-09-10 NOTE — Patient Instructions (Signed)
Health Maintenance After Age 75 After age 75, you are at a higher risk for certain long-term diseases and infections as well as injuries from falls. Falls are a major cause of broken bones and head injuries in people who are older than age 75. Getting regular preventive care can help to keep you healthy and well. Preventive care includes getting regular testing and making lifestyle changes as recommended by your health care provider. Talk with your health care provider about:  Which screenings and tests you should have. A screening is a test that checks for a disease when you have no symptoms.  A diet and exercise plan that is right for you. What should I know about screenings and tests to prevent falls? Screening and testing are the best ways to find a health problem early. Early diagnosis and treatment give you the best chance of managing medical conditions that are common after age 75. Certain conditions and lifestyle choices may make you more likely to have a fall. Your health care provider may recommend:  Regular vision checks. Poor vision and conditions such as cataracts can make you more likely to have a fall. If you wear glasses, make sure to get your prescription updated if your vision changes.  Medicine review. Work with your health care provider to regularly review all of the medicines you are taking, including over-the-counter medicines. Ask your health care provider about any side effects that may make you more likely to have a fall. Tell your health care provider if any medicines that you take make you feel dizzy or sleepy.  Osteoporosis screening. Osteoporosis is a condition that causes the bones to get weaker. This can make the bones weak and cause them to break more easily.  Blood pressure screening. Blood pressure changes and medicines to control blood pressure can make you feel dizzy.  Strength and balance checks. Your health care provider may recommend certain tests to check your  strength and balance while standing, walking, or changing positions.  Foot health exam. Foot pain and numbness, as well as not wearing proper footwear, can make you more likely to have a fall.  Depression screening. You may be more likely to have a fall if you have a fear of falling, feel emotionally low, or feel unable to do activities that you used to do.  Alcohol use screening. Using too much alcohol can affect your balance and may make you more likely to have a fall. What actions can I take to lower my risk of falls? General instructions  Talk with your health care provider about your risks for falling. Tell your health care provider if: ? You fall. Be sure to tell your health care provider about all falls, even ones that seem minor. ? You feel dizzy, sleepy, or off-balance.  Take over-the-counter and prescription medicines only as told by your health care provider. These include any supplements.  Eat a healthy diet and maintain a healthy weight. A healthy diet includes low-fat dairy products, low-fat (lean) meats, and fiber from whole grains, beans, and lots of fruits and vegetables. Home safety  Remove any tripping hazards, such as rugs, cords, and clutter.  Install safety equipment such as grab bars in bathrooms and safety rails on stairs.  Keep rooms and walkways well-lit. Activity   Follow a regular exercise program to stay fit. This will help you maintain your balance. Ask your health care provider what types of exercise are appropriate for you.  If you need a cane or   walker, use it as recommended by your health care provider.  Wear supportive shoes that have nonskid soles. Lifestyle  Do not drink alcohol if your health care provider tells you not to drink.  If you drink alcohol, limit how much you have: ? 0-1 drink a day for women. ? 0-2 drinks a day for men.  Be aware of how much alcohol is in your drink. In the [place] one drink equals one typical bottle of beer (12  oz), one-half glass of wine (5 oz), or one shot of hard liquor (1 oz).  Do not use any products that contain nicotine or tobacco, such as cigarettes and e-cigarettes. If you need help quitting, ask your health care provider. Summary  Having a healthy lifestyle and getting preventive care can help to protect your health and wellness after age 75.  Screening and testing are the best way to find a health problem early and help you avoid having a fall. Early diagnosis and treatment give you the best chance for managing medical conditions that are more common for people who are older than age 75.  Falls are a major cause of broken bones and head injuries in people who are older than age 75. Take precautions to prevent a fall at home.  Work with your health care provider to learn what changes you can make to improve your health and wellness and to prevent falls. This information is not intended to replace advice given to you by your health care provider. Make sure you discuss any questions you have with your health care provider. Document Released: 07/02/2017 Document Revised: 07/02/2017 Document Reviewed: 07/02/2017 Elsevier Interactive Patient Education  2019 [employer]  

## 2018-11-17 ENCOUNTER — Ambulatory Visit: Payer: 59 | Admitting: Cardiology

## 2019-03-10 ENCOUNTER — Other Ambulatory Visit: Payer: Self-pay

## 2019-03-11 ENCOUNTER — Encounter: Payer: Self-pay | Admitting: Family

## 2019-03-11 ENCOUNTER — Ambulatory Visit (INDEPENDENT_AMBULATORY_CARE_PROVIDER_SITE_OTHER): Payer: POS | Admitting: Family

## 2019-03-11 DIAGNOSIS — I10 Essential (primary) hypertension: Secondary | ICD-10-CM

## 2019-03-11 DIAGNOSIS — E559 Vitamin D deficiency, unspecified: Secondary | ICD-10-CM | POA: Diagnosis not present

## 2019-03-11 DIAGNOSIS — E785 Hyperlipidemia, unspecified: Secondary | ICD-10-CM | POA: Diagnosis not present

## 2019-03-11 DIAGNOSIS — E663 Overweight: Secondary | ICD-10-CM | POA: Diagnosis not present

## 2019-03-11 DIAGNOSIS — Z8673 Personal history of transient ischemic attack (TIA), and cerebral infarction without residual deficits: Secondary | ICD-10-CM

## 2019-03-11 NOTE — Progress Notes (Signed)
   Virtual Visit via telephone Note  I connected with Andrea Ross on 03/11/19 at 10:26 AM by telephone and verified that I am speaking with the correct person using two identifiers. Andrea Ross is currently located at home and no one is currently with her during visit. The provider, Evelina Dun, FNP is located in their office at time of visit.  I discussed the limitations, risks, security and privacy concerns of performing an evaluation and management service by telephone and the availability of in person appointments. I also discussed with the patient that there may be a patient responsible charge related to this service. The patient expressed understanding and agreed to proceed.   History and Present Illness:  Pt calls the office today for chronic follow up. Pt has hx of CVA.  Hypertension This is a chronic problem. The current episode started more than 1 year ago. The problem has been resolved (129/81 this morning) since onset. The problem is controlled. Pertinent negatives include no headaches, malaise/fatigue, peripheral edema or shortness of breath. Risk factors for coronary artery disease include dyslipidemia. The current treatment provides moderate improvement. Hypertensive end-organ damage includes CVA.  Hyperlipidemia This is a chronic problem. The current episode started more than 1 year ago. The problem is controlled. Recent lipid tests were reviewed and are normal. Pertinent negatives include no shortness of breath. Current antihyperlipidemic treatment includes statins. The current treatment provides moderate improvement of lipids. Risk factors for coronary artery disease include dyslipidemia, hypertension and a sedentary lifestyle.      Review of Systems  Constitutional: Negative for malaise/fatigue.  Respiratory: Negative for shortness of breath.   Neurological: Negative for headaches.     Observations/Objective: No SOB or distress   Assessment and Plan: Andrea Ross comes in today with chief complaint of No chief complaint on file.   Diagnosis and orders addressed:  1. Essential hypertension, benign - CMP14+EGFR; Future - CBC with Differential/Platelet; Future  2. Vitamin D deficiency - CMP14+EGFR; Future - CBC with Differential/Platelet; Future - VITAMIN D 25 Hydroxy (Vit-D Deficiency, Fractures); Future  3. Overweight (BMI 25.0-29.9) - CMP14+EGFR; Future - CBC with Differential/Platelet; Future  4. Hyperlipidemia, unspecified hyperlipidemia type - CMP14+EGFR; Future - Lipid panel; Future - CBC with Differential/Platelet; Future  5. History of cardioembolic cerebrovascular accident (CVA) - CMP14+EGFR; Future - Lipid panel; Future - CBC with Differential/Platelet; Future   Labs pending Health Maintenance reviewed Diet and exercise encouraged  Follow up plan: 6 months      I discussed the assessment and treatment plan with the patient. The patient was provided an opportunity to ask questions and all were answered. The patient agreed with the plan and demonstrated an understanding of the instructions.   The patient was advised to call back or seek an in-person evaluation if the symptoms worsen or if the condition fails to improve as anticipated.  The above assessment and management plan was discussed with the patient. The patient verbalized understanding of and has agreed to the management plan. Patient is aware to call the clinic if symptoms persist or worsen. Patient is aware when to return to the clinic for a follow-up visit. Patient educated on when it is appropriate to go to the emergency department.   Time call ended: 10:39 AM   I provided 13 minutes of non-face-to-face time during this encounter.    Evelina Dun, FNP

## 2019-03-16 ENCOUNTER — Other Ambulatory Visit: Payer: POS

## 2019-03-16 ENCOUNTER — Other Ambulatory Visit: Payer: Self-pay

## 2019-03-16 DIAGNOSIS — E663 Overweight: Secondary | ICD-10-CM

## 2019-03-16 DIAGNOSIS — Z8673 Personal history of transient ischemic attack (TIA), and cerebral infarction without residual deficits: Secondary | ICD-10-CM

## 2019-03-16 DIAGNOSIS — E785 Hyperlipidemia, unspecified: Secondary | ICD-10-CM

## 2019-03-16 DIAGNOSIS — I10 Essential (primary) hypertension: Secondary | ICD-10-CM

## 2019-03-16 DIAGNOSIS — E559 Vitamin D deficiency, unspecified: Secondary | ICD-10-CM

## 2019-03-17 LAB — LIPID PANEL
Chol/HDL Ratio: 4.6 ratio — ABNORMAL HIGH (ref 0.0–4.4)
Cholesterol, Total: 164 mg/dL (ref 100–199)
HDL: 36 mg/dL — ABNORMAL LOW (ref >39)
LDL Calculated: 102 mg/dL — ABNORMAL HIGH (ref 0–99)
Triglycerides: 129 mg/dL (ref 0–149)
VLDL Cholesterol Cal: 26 mg/dL (ref 5–40)

## 2019-03-17 LAB — CMP14+EGFR
ALT: 11 IU/L (ref 0–32)
AST: 14 IU/L (ref 0–40)
Albumin/Globulin Ratio: 1.6 (ref 1.2–2.2)
Albumin: 4.4 g/dL (ref 3.7–4.7)
Alkaline Phosphatase: 128 IU/L — ABNORMAL HIGH (ref 39–117)
BUN/Creatinine Ratio: 15 (ref 12–28)
BUN: 26 mg/dL (ref 8–27)
Bilirubin Total: 0.4 mg/dL (ref 0.0–1.2)
CO2: 20 mmol/L (ref 20–29)
Calcium: 9.9 mg/dL (ref 8.7–10.3)
Chloride: 104 mmol/L (ref 96–106)
Creatinine, Ser: 1.78 mg/dL — ABNORMAL HIGH (ref 0.57–1.00)
GFR calc Af Amer: 32 mL/min/{1.73_m2} — ABNORMAL LOW (ref >59)
GFR calc non Af Amer: 28 mL/min/{1.73_m2} — ABNORMAL LOW (ref >59)
Globulin, Total: 2.7 g/dL (ref 1.5–4.5)
Glucose: 84 mg/dL (ref 65–99)
Potassium: 4.7 mmol/L (ref 3.5–5.2)
Sodium: 141 mmol/L (ref 134–144)
Total Protein: 7.1 g/dL (ref 6.0–8.5)

## 2019-03-17 LAB — CBC WITH DIFFERENTIAL/PLATELET
Basophils Absolute: 0 10*3/uL (ref 0.0–0.2)
Basos: 0 %
EOS (ABSOLUTE): 0.2 10*3/uL (ref 0.0–0.4)
Eos: 3 %
Hematocrit: 38.1 % (ref 34.0–46.6)
Hemoglobin: 12.4 g/dL (ref 11.1–15.9)
Immature Grans (Abs): 0 10*3/uL (ref 0.0–0.1)
Immature Granulocytes: 0 %
Lymphocytes Absolute: 1.9 10*3/uL (ref 0.7–3.1)
Lymphs: 27 %
MCH: 28.2 pg (ref 26.6–33.0)
MCHC: 32.5 g/dL (ref 31.5–35.7)
MCV: 87 fL (ref 79–97)
Monocytes Absolute: 0.8 10*3/uL (ref 0.1–0.9)
Monocytes: 11 %
Neutrophils Absolute: 4.1 10*3/uL (ref 1.4–7.0)
Neutrophils: 59 %
Platelets: 239 10*3/uL (ref 150–450)
RBC: 4.4 x10E6/uL (ref 3.77–5.28)
RDW: 13 % (ref 11.7–15.4)
WBC: 7 10*3/uL (ref 3.4–10.8)

## 2019-03-17 LAB — VITAMIN D 25 HYDROXY (VIT D DEFICIENCY, FRACTURES): Vit D, 25-Hydroxy: 40.1 ng/mL (ref 30.0–100.0)

## 2019-03-21 LAB — SPECIMEN STATUS REPORT

## 2019-03-21 LAB — GAMMA GT: GGT: 14 IU/L (ref 0–60)

## 2019-08-17 ENCOUNTER — Other Ambulatory Visit: Payer: Self-pay | Admitting: Family

## 2019-08-17 DIAGNOSIS — E785 Hyperlipidemia, unspecified: Secondary | ICD-10-CM

## 2019-08-17 NOTE — Telephone Encounter (Signed)
09/13/19 OV

## 2019-09-13 ENCOUNTER — Encounter: Payer: Self-pay | Admitting: Family

## 2019-09-13 ENCOUNTER — Ambulatory Visit (INDEPENDENT_AMBULATORY_CARE_PROVIDER_SITE_OTHER): Payer: POS | Admitting: Family

## 2019-09-13 VITALS — BP 130/90

## 2019-09-13 DIAGNOSIS — E663 Overweight: Secondary | ICD-10-CM | POA: Diagnosis not present

## 2019-09-13 DIAGNOSIS — E559 Vitamin D deficiency, unspecified: Secondary | ICD-10-CM

## 2019-09-13 DIAGNOSIS — I10 Essential (primary) hypertension: Secondary | ICD-10-CM

## 2019-09-13 DIAGNOSIS — E785 Hyperlipidemia, unspecified: Secondary | ICD-10-CM

## 2019-09-13 DIAGNOSIS — Z8673 Personal history of transient ischemic attack (TIA), and cerebral infarction without residual deficits: Secondary | ICD-10-CM

## 2019-09-13 DIAGNOSIS — Z780 Asymptomatic menopausal state: Secondary | ICD-10-CM

## 2019-09-13 NOTE — Progress Notes (Signed)
Virtual Visit via telephone Note Due to COVID-19 pandemic this visit was conducted virtually. This visit type was conducted due to national recommendations for restrictions regarding the COVID-19 Pandemic (e.g. social distancing, sheltering in place) in an effort to limit this patient's exposure and mitigate transmission in our community. All issues noted in this document were discussed and addressed.  A physical exam was not performed with this format.  I connected with Andrea Ross on 09/13/19 at 9:20 AM  by telephone and verified that I am speaking with the correct person using two identifiers. Andrea Ross is currently located at home and sister is currently with her during visit. The provider, Evelina Dun, FNP is located in their office at time of visit.  I discussed the limitations, risks, security and privacy concerns of performing an evaluation and management service by telephone and the availability of in person appointments. I also discussed with the patient that there may be a patient responsible charge related to this service. The patient expressed understanding and agreed to proceed.   History and Present Illness:  Pt calls the office today for chronic follow up. Pt has hx of CVA Hypertension This is a chronic problem. The current episode started more than 1 year ago. The problem has been resolved since onset. The problem is controlled. Pertinent negatives include no headaches, malaise/fatigue, peripheral edema or shortness of breath. Risk factors for coronary artery disease include dyslipidemia and sedentary lifestyle. Past treatments include diuretics, calcium channel blockers and angiotensin blockers. The current treatment provides moderate improvement. Hypertensive end-organ damage includes CVA.  Hyperlipidemia This is a chronic problem. The current episode started more than 1 year ago. The problem is controlled. Recent lipid tests were reviewed and are normal. Pertinent  negatives include no shortness of breath. Current antihyperlipidemic treatment includes statins. The current treatment provides moderate improvement of lipids. Risk factors for coronary artery disease include dyslipidemia, hypertension, post-menopausal and a sedentary lifestyle.      Review of Systems  Constitutional: Negative for malaise/fatigue.  Respiratory: Negative for shortness of breath.   Neurological: Negative for headaches.  All other systems reviewed and are negative.    Observations/Objective: No SOB or distress noted   Assessment and Plan: Andrea Ross comes in today with chief complaint of No chief complaint on file.   Diagnosis and orders addressed:  1. Essential hypertension, benign - CMP14+EGFR - CBC with Differential/Platelet  2. History of cardioembolic cerebrovascular accident (CVA) - CMP14+EGFR - CBC with Differential/Platelet  3. Overweight (BMI 25.0-29.9) - CMP14+EGFR - CBC with Differential/Platelet  4. Vitamin D deficiency - CMP14+EGFR - CBC with Differential/Platelet  5. Hyperlipidemia, unspecified hyperlipidemia type - CMP14+EGFR - CBC with Differential/Platelet  6. Post-menopause - CMP14+EGFR - CBC with Differential/Platelet - DG [facility] DEXA   Labs pending Health Maintenance reviewed Diet and exercise encouraged  Follow up plan: 6 months      I discussed the assessment and treatment plan with the patient. The patient was provided an opportunity to ask questions and all were answered. The patient agreed with the plan and demonstrated an understanding of the instructions.   The patient was advised to call back or seek an in-person evaluation if the symptoms worsen or if the condition fails to improve as anticipated.  The above assessment and management plan was discussed with the patient. The patient verbalized understanding of and has agreed to the management plan. Patient is aware to call the clinic if symptoms persist or  worsen. Patient is aware  when to return to the clinic for a follow-up visit. Patient educated on when it is appropriate to go to the emergency department.   Time call ended:  9:40 AM   I provided 20 minutes of non-face-to-face time during this encounter.    Evelina Dun, FNP

## 2019-09-17 ENCOUNTER — Other Ambulatory Visit: Payer: Self-pay | Admitting: Family

## 2019-09-17 DIAGNOSIS — I10 Essential (primary) hypertension: Secondary | ICD-10-CM

## 2019-09-22 ENCOUNTER — Ambulatory Visit (INDEPENDENT_AMBULATORY_CARE_PROVIDER_SITE_OTHER): Payer: POS

## 2019-09-22 ENCOUNTER — Other Ambulatory Visit: Payer: Self-pay

## 2019-09-22 ENCOUNTER — Other Ambulatory Visit: Payer: POS

## 2019-09-22 DIAGNOSIS — Z780 Asymptomatic menopausal state: Secondary | ICD-10-CM | POA: Diagnosis not present

## 2019-09-22 LAB — CBC WITH DIFFERENTIAL/PLATELET
Basophils Absolute: 0 10*3/uL (ref 0.0–0.2)
Basos: 0 %
EOS (ABSOLUTE): 0.1 10*3/uL (ref 0.0–0.4)
Eos: 2 %
Hematocrit: 36.7 % (ref 34.0–46.6)
Hemoglobin: 12.1 g/dL (ref 11.1–15.9)
Immature Grans (Abs): 0 10*3/uL (ref 0.0–0.1)
Immature Granulocytes: 0 %
Lymphocytes Absolute: 1.7 10*3/uL (ref 0.7–3.1)
Lymphs: 25 %
MCH: 28.5 pg (ref 26.6–33.0)
MCHC: 33 g/dL (ref 31.5–35.7)
MCV: 86 fL (ref 79–97)
Monocytes Absolute: 0.7 10*3/uL (ref 0.1–0.9)
Monocytes: 9 %
Neutrophils Absolute: 4.4 10*3/uL (ref 1.4–7.0)
Neutrophils: 64 %
Platelets: 237 10*3/uL (ref 150–450)
RBC: 4.25 x10E6/uL (ref 3.77–5.28)
RDW: 13.4 % (ref 11.7–15.4)
WBC: 7 10*3/uL (ref 3.4–10.8)

## 2019-09-22 LAB — CMP14+EGFR
ALT: 11 IU/L (ref 0–32)
AST: 14 IU/L (ref 0–40)
Albumin/Globulin Ratio: 1.6 (ref 1.2–2.2)
Albumin: 4.2 g/dL (ref 3.7–4.7)
Alkaline Phosphatase: 132 IU/L — ABNORMAL HIGH (ref 39–117)
BUN/Creatinine Ratio: 13 (ref 12–28)
BUN: 26 mg/dL (ref 8–27)
Bilirubin Total: 0.4 mg/dL (ref 0.0–1.2)
CO2: 21 mmol/L (ref 20–29)
Calcium: 9.3 mg/dL (ref 8.7–10.3)
Chloride: 103 mmol/L (ref 96–106)
Creatinine, Ser: 1.94 mg/dL — ABNORMAL HIGH (ref 0.57–1.00)
GFR calc Af Amer: 28 mL/min/{1.73_m2} — ABNORMAL LOW (ref >59)
GFR calc non Af Amer: 25 mL/min/{1.73_m2} — ABNORMAL LOW (ref >59)
Globulin, Total: 2.6 g/dL (ref 1.5–4.5)
Glucose: 89 mg/dL (ref 65–99)
Potassium: 4.3 mmol/L (ref 3.5–5.2)
Sodium: 136 mmol/L (ref 134–144)
Total Protein: 6.8 g/dL (ref 6.0–8.5)

## 2019-09-23 ENCOUNTER — Other Ambulatory Visit: Payer: Self-pay | Admitting: Family

## 2019-09-23 DIAGNOSIS — M8580 Other specified disorders of bone density and structure, unspecified site: Secondary | ICD-10-CM | POA: Insufficient documentation

## 2019-09-23 DIAGNOSIS — N184 Chronic kidney disease, stage 4 (severe): Secondary | ICD-10-CM | POA: Insufficient documentation

## 2019-09-27 ENCOUNTER — Telehealth: Payer: Self-pay | Admitting: Family

## 2019-09-27 NOTE — Telephone Encounter (Signed)
Aware.  Scripts are for 90 and had refills .

## 2019-09-27 NOTE — Telephone Encounter (Signed)
What is the name of the medication? amLODipine (NORVASC) 10 MG tablet   losartan-hydrochlorothiazide (HYZAAR) 100-25 MG tablet   Have you contacted your pharmacy to request a refill? Yes, Pt received letter in mail telling her to request a 90 day supply from PCP  Which pharmacy would you like this sent to? [facility]    Patient notified that their request is being sent to the clinical staff for review and that they should receive a call once it is complete. If they do not receive a call within 24 hours they can check with their pharmacy or our office.

## 2019-10-07 ENCOUNTER — Other Ambulatory Visit (HOSPITAL_COMMUNITY): Payer: Self-pay | Admitting: Nephrology

## 2019-10-07 DIAGNOSIS — N189 Chronic kidney disease, unspecified: Secondary | ICD-10-CM

## 2019-10-12 ENCOUNTER — Other Ambulatory Visit: Payer: POS

## 2019-10-12 ENCOUNTER — Other Ambulatory Visit: Payer: Self-pay

## 2019-10-18 ENCOUNTER — Encounter (INDEPENDENT_AMBULATORY_CARE_PROVIDER_SITE_OTHER): Payer: POS | Admitting: Ophthalmology

## 2019-10-21 ENCOUNTER — Ambulatory Visit (HOSPITAL_COMMUNITY): Payer: POS

## 2019-10-22 ENCOUNTER — Other Ambulatory Visit: Payer: Self-pay | Admitting: Family

## 2019-10-22 ENCOUNTER — Ambulatory Visit (HOSPITAL_COMMUNITY): Payer: 59

## 2019-10-22 DIAGNOSIS — I10 Essential (primary) hypertension: Secondary | ICD-10-CM

## 2019-10-28 ENCOUNTER — Ambulatory Visit (HOSPITAL_COMMUNITY): Payer: POS

## 2019-11-09 ENCOUNTER — Ambulatory Visit (HOSPITAL_COMMUNITY)
Admission: RE | Admit: 2019-11-09 | Discharge: 2019-11-09 | Disposition: A | Discharge status: Home / Self Care | Payer: POS | Source: Ambulatory Visit | Attending: Nephrology | Admitting: Nephrology

## 2019-11-09 ENCOUNTER — Other Ambulatory Visit: Payer: Self-pay

## 2019-11-09 DIAGNOSIS — N189 Chronic kidney disease, unspecified: Secondary | ICD-10-CM | POA: Diagnosis not present

## 2019-11-11 ENCOUNTER — Other Ambulatory Visit: Payer: Self-pay | Admitting: Family

## 2019-11-11 DIAGNOSIS — E785 Hyperlipidemia, unspecified: Secondary | ICD-10-CM

## 2019-11-12 ENCOUNTER — Telehealth: Payer: Self-pay | Admitting: Family

## 2019-11-12 NOTE — Telephone Encounter (Signed)
FYI for provider

## 2020-02-02 ENCOUNTER — Other Ambulatory Visit: Payer: Self-pay | Admitting: Family

## 2020-02-02 DIAGNOSIS — E785 Hyperlipidemia, unspecified: Secondary | ICD-10-CM

## 2020-02-08 ENCOUNTER — Other Ambulatory Visit: Payer: POS

## 2020-02-08 ENCOUNTER — Other Ambulatory Visit: Payer: Self-pay

## 2020-02-16 ENCOUNTER — Other Ambulatory Visit (HOSPITAL_COMMUNITY): Payer: Self-pay | Admitting: Nephrology

## 2020-02-16 ENCOUNTER — Other Ambulatory Visit: Payer: Self-pay

## 2020-02-16 ENCOUNTER — Ambulatory Visit (HOSPITAL_COMMUNITY)
Admission: RE | Admit: 2020-02-16 | Discharge: 2020-02-16 | Disposition: A | Discharge status: Home / Self Care | Payer: POS | Source: Ambulatory Visit | Attending: Nephrology | Admitting: Nephrology

## 2020-02-16 DIAGNOSIS — N1831 Chronic kidney disease, stage 3a: Secondary | ICD-10-CM | POA: Insufficient documentation

## 2020-02-16 DIAGNOSIS — N170 Acute kidney failure with tubular necrosis: Secondary | ICD-10-CM | POA: Diagnosis present

## 2020-02-21 ENCOUNTER — Other Ambulatory Visit: Payer: Self-pay

## 2020-02-21 ENCOUNTER — Other Ambulatory Visit: Payer: POS

## 2020-02-25 ENCOUNTER — Other Ambulatory Visit: Payer: Self-pay | Admitting: Family

## 2020-02-25 DIAGNOSIS — E785 Hyperlipidemia, unspecified: Secondary | ICD-10-CM

## 2020-02-28 NOTE — Telephone Encounter (Signed)
30 day supply given pt has appt with Evelina Dun 03/13/20

## 2020-03-03 ENCOUNTER — Other Ambulatory Visit: Payer: Self-pay | Admitting: Family

## 2020-03-03 NOTE — Telephone Encounter (Signed)
Spoke with patient, her last office visit was 09/14/2019 so I explained to her we could need fill her prescriptions for 90 days.  She stated she has an appointment on 03/13/2020 with [facility] and has enough medication until then.

## 2020-03-09 ENCOUNTER — Other Ambulatory Visit: Payer: Self-pay

## 2020-03-09 ENCOUNTER — Ambulatory Visit (HOSPITAL_COMMUNITY)
Admission: RE | Admit: 2020-03-09 | Discharge: 2020-03-09 | Disposition: A | Discharge status: Home / Self Care | Payer: POS | Source: Ambulatory Visit | Attending: Urology | Admitting: Urology

## 2020-03-09 ENCOUNTER — Ambulatory Visit (INDEPENDENT_AMBULATORY_CARE_PROVIDER_SITE_OTHER): Payer: POS | Admitting: Urology

## 2020-03-09 ENCOUNTER — Encounter: Payer: Self-pay | Admitting: Urology

## 2020-03-09 VITALS — BP 177/71 | HR 68 | Temp 97.5°F | Ht 64.5 in | Wt 158.0 lb

## 2020-03-09 DIAGNOSIS — N1330 Unspecified hydronephrosis: Secondary | ICD-10-CM | POA: Diagnosis present

## 2020-03-09 LAB — POCT URINALYSIS DIPSTICK
Bilirubin, UA: NEGATIVE
Blood, UA: NEGATIVE
Glucose, UA: NEGATIVE
Ketones, UA: NEGATIVE
Nitrite, UA: NEGATIVE
Protein, UA: NEGATIVE
Spec Grav, UA: 1.01 (ref 1.010–1.025)
Urobilinogen, UA: 0.2 E.U./dL
pH, UA: 6 (ref 5.0–8.0)

## 2020-03-09 NOTE — Progress Notes (Signed)
Urological Symptom Review  Patient is experiencing the following symptoms: Get up at night to urinate   Review of Systems  Gastrointestinal (upper)  : Negative for upper GI symptoms  Gastrointestinal (lower) : Constipation  Constitutional : Negative for symptoms  Skin: Negative for skin symptoms  Eyes: Negative for eye symptoms  Ear/Nose/Throat : Negative for Ear/Nose/Throat symptoms  Hematologic/Lymphatic: Negative for Hematologic/Lymphatic symptoms  Cardiovascular : Negative for cardiovascular symptoms  Respiratory : Negative for respiratory symptoms  Endocrine: Negative for endocrine symptoms  Musculoskeletal: Negative for musculoskeletal symptoms  Neurological: Negative for neurological symptoms  Psychologic: Negative for psychiatric symptoms

## 2020-03-09 NOTE — Progress Notes (Signed)
03/09/2020 10:49 AM   [name] 1945/12/08 [id number]  Referring provider: Sharion Balloon, [place] [place],  [place] [address]  Left hydronephrosis  HPI: Ms Vecchio is a 74yo here for evaluation of left hydronephrosis. SHe was diagnosed with left moderate hydronephrosis on 02/17/2020 for a workup of elevated creatinine. NO left flank pain. NO hx of nephrolithiasis. No hx of major abdominal surgery.    PMH: Past Medical History:  Diagnosis Date  . Glaucoma   . Heart murmur   . Hyperlipidemia   . Hypertension   . Kidney failure   . Stroke [facility]) [id number]    Surgical History: Past Surgical History:  Procedure Laterality Date  . 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 fibroid tumors  1992  . HIATAL HERNIA REPAIR  2004  . SLT LASER APPLICATION Right 5/73/2202   Procedure: SLT LASER APPLICATION;  Surgeon: Williams Che, MD;  Location: [facility];  Service: Ophthalmology;  Laterality: Right;  . UMBILICAL HERNIA REPAIR  1980    Home Medications:  Allergies as of 03/09/2020      Reactions   Pepperoni [pickled Meat] Swelling   Penicillins Rash      Medication List       Accurate as of March 09, 2020 10:49 AM. If you have any questions, ask your nurse or doctor.        amLODipine 10 MG tablet Commonly known as: NORVASC TAKE 1 TABLET (10 MG TOTAL) BY MOUTH DAILY. (NEEDS TO BE SEEN BEFORE NEXT REFILL)   aspirin 81 MG chewable tablet Chew 81 mg by mouth daily.   atorvastatin 40 MG tablet Commonly known as: LIPITOR TAKE 1 TABLET (40 MG TOTAL) BY MOUTH DAILY. NEEDS TO BE SEEN FOR FURTHER REFILLS.   brimonidine 0.2 % ophthalmic solution Commonly known as: ALPHAGAN INSTILL 1 DROP INTO BOTH EYES TWICE A DAY   carvedilol 3.125 MG tablet Commonly known as: COREG Take 6.25 mg by mouth 2 (two) times daily.   dorzolamide-timolol 22.3-6.8 MG/ML ophthalmic solution Commonly known as: COSOPT INSTILL 1 DROP INTO BOTH EYES TWICE A DAY    latanoprost 0.005 % ophthalmic solution Commonly known as: XALATAN INSTILL 1 DROP INTO BOTH EYES EVERY DAY AT NIGHT   losartan-hydrochlorothiazide 100-25 MG tablet Commonly known as: HYZAAR Take 1 tablet by mouth daily.   multivitamin-iron-minerals-folic acid chewable tablet Chew by mouth.   OVER THE COUNTER MEDICATION Centrum multi-vitamin   spironolactone 25 MG tablet Commonly known as: ALDACTONE Take by mouth. What changed: Another medication with the same name was removed. Continue taking this medication, and follow the directions you see here. Changed by: Nicolette Bang, MD       Allergies:  Allergies  Allergen Reactions  . Pepperoni [Pickled Meat] Swelling  . Penicillins Rash    Family History: Family History  Problem Relation Age of Onset  . Diabetes Mother   . Heart disease Mother   . Cancer Father     Social History:  reports that she quit smoking about 49 years ago. Her smoking use included cigarettes. She started smoking about 49 years ago. She has a 0.08 pack-year smoking history. She has never used smokeless tobacco. She reports that she does not drink alcohol and does not use drugs.  ROS: All other review of systems were reviewed and are negative except what is noted above in HPI  Physical Exam: BP (!) 177/71   Pulse 68   Temp (!) 97.5 F (  36.4 C)   Ht 5' 4.5" (1.638 m)   Wt 158 lb (71.7 kg)   BMI 26.70 kg/m   Constitutional:  Alert and oriented, No acute distress. HEENT: [place] AT, moist mucus membranes.  Trachea midline, no masses. Cardiovascular: No clubbing, cyanosis, or edema. Respiratory: Normal respiratory effort, no increased work of breathing. GI: Abdomen is soft, nontender, nondistended, no abdominal masses GU: No CVA tenderness.  Lymph: No cervical or inguinal lymphadenopathy. Skin: No rashes, bruises or suspicious lesions. Neurologic: Grossly intact, no focal deficits, moving all 4 extremities. Psychiatric: Normal mood and  affect.  Laboratory Data: Lab Results  Component Value Date   WBC 7.0 09/22/2019   HGB 12.1 09/22/2019   HCT 36.7 09/22/2019   MCV 86 09/22/2019   PLT 237 09/22/2019    Lab Results  Component Value Date   CREATININE 1.94 (H) 09/22/2019    No results found for: PSA  No results found for: TESTOSTERONE  No results found for: HGBA1C  Urinalysis    Component Value Date/Time   BILIRUBINUR neg 03/09/2020 0948   PROTEINUR Negative 03/09/2020 0948   UROBILINOGEN 0.2 03/09/2020 0948   NITRITE neg 03/09/2020 0948   LEUKOCYTESUR Moderate (2+) (A) 03/09/2020 0948    No results found for: LABMICR, [place], RBCUA, LABEPIT, MUCUS, BACTERIA  Pertinent Imaging: Renal US 02/17/2020: Images reviewed and discussed witht he patient No results found for this or any previous visit.  No results found for this or any previous visit.  No results found for this or any previous visit.  No results found for this or any previous visit.  Results for orders placed during the hospital encounter of 02/16/20  US RENAL  Narrative CLINICAL DATA:  ATN and CKD.  EXAM: RENAL / URINARY TRACT ULTRASOUND COMPLETE  COMPARISON:  Renal ultrasound dated November 09, 2018.  FINDINGS: Right Kidney:  Renal measurements: 7.9 x 3.1 x 3.9 cm = volume: 50 mL . Echogenicity within normal limits. No mass or hydronephrosis visualized.  Left Kidney:  Renal measurements: 9.8 x 4.3 x 4.5 cm = volume: 99 mL. Echogenicity within normal limits. Unchanged mild hydronephrosis involving the lower pole. No mass visualized.  Bladder:  Appears normal for degree of bladder distention.  Other:  None.  IMPRESSION: 1. Unchanged mild left hydronephrosis involving the lower pole. 2. Unchanged right renal atrophy.   Electronically Signed By: Titus Dubin M.D. On: 02/16/2020 11:56  No results found for this or any previous visit.  No results found for this or any previous visit.  No results found for this  or any previous visit.   Assessment & Plan:    1. Hydronephrosis, unspecified hydronephrosis type -I discussed the natural history of hydronephrosis and the various causes. Due to the patients CKD we will obtaina  CT stone study. CT stone study, will call with results - POCT urinalysis dipstick   No follow-ups on file.  Nicolette Bang, MD  [facility] [place]

## 2020-03-09 NOTE — Patient Instructions (Signed)
Hydronephrosis  Hydronephrosis is the swelling of one or both kidneys due to a blockage that stops urine from flowing out of the body. Kidneys filter waste from the blood and produce urine. This condition can lead to kidney failure and may become life threatening if not treated promptly. What are the causes? Common causes of this condition include:  Problems that occur when a baby is developing in the womb (congenital defect). These can include problems: ? In the kidneys. ? In the tubes that drain urine from the kidneys into the bladder (ureters).  Kidney stones.  Bladder infection.  An enlarged prostate gland.  Scar tissue from a previous surgery or injury.  A blood clot.  A tumor or cyst in the abdomen or pelvis.  Cancer of the prostate, bladder, uterus, ovary, or colon. What are the signs or symptoms? Symptoms of this condition include:  Pain or discomfort in your side (flank).  Pain and swelling in your abdomen.  Nausea and vomiting.  Fever.  Pain when passing urine.  Feelings of urgency when you need to urinate.  Urinating more often than normal. In some cases, you may not have any symptoms. How is this diagnosed? This condition may be diagnosed based on:  Your symptoms and medical history.  A physical exam.  Blood and urine tests.  Imaging tests, such as an ultrasound, CT scan, or MRI.  A procedure in which a scope is inserted into the urethra and used to view parts of the urinary tract and bladder (cystoscopy). How is this treated? Treatment for this condition depends on where the blockage is, how long it has been there, and what caused it. The goal of treatment is to remove the blockage. Treatment may include:  Antibiotic medicines to treat or prevent infection.  A procedure to place a small, thin tube (stent) into a blocked ureter. The stent will keep the ureter open so that urine can drain through it.  A nonsurgical procedure that crushes kidney  stones with shock waves (extracorporeal shock wave lithotripsy).  If kidney failure occurs, treatment may include dialysis or a kidney transplant. Follow these instructions at home:   Take over-the-counter and prescription medicines only as told by your health care provider.  Rest and return to your normal activities as told by your health care provider. Ask your health care provider what activities are safe for you.  Drink enough fluid to keep your urine pale yellow.  If you were prescribed an antibiotic medicine, take it exactly as told by your health care provider. Do not stop taking the antibiotic even if you start to feel better.  Keep all follow-up visits as told by your health care provider. This is important. Contact a health care provider if:  You continue to have symptoms after treatment.  You develop new symptoms.  Your urine becomes cloudy or bloody.  You have a fever. Get help right away if:  You have severe flank or abdominal pain.  You cannot drink fluids without vomiting. Summary  Hydronephrosis is the swelling of one or both kidneys due to a blockage that stops urine from flowing out of the body.  Hydronephrosis can lead to kidney failure and may become life threatening if not treated promptly.  The goal of treatment is to treat the cause of the blockage. It may include insertion of stent into a blocked ureter, a procedure to treat kidney stones, and antibiotic medicines.  Follow your health care provider's instructions for taking care of yourself at   home, including instructions about drinking fluids, taking medicines, and limiting activities. This information is not intended to replace advice given to you by your health care provider. Make sure you discuss any questions you have with your health care provider. Document Revised: 08/30/2017 Document Reviewed: 08/30/2017 Elsevier Patient Education  2020 Elsevier Inc.  

## 2020-03-12 ENCOUNTER — Other Ambulatory Visit: Payer: Self-pay | Admitting: Family

## 2020-03-12 DIAGNOSIS — I10 Essential (primary) hypertension: Secondary | ICD-10-CM

## 2020-03-13 ENCOUNTER — Other Ambulatory Visit: Payer: Self-pay

## 2020-03-13 ENCOUNTER — Encounter: Payer: Self-pay | Admitting: Family

## 2020-03-13 ENCOUNTER — Ambulatory Visit: Payer: POS | Admitting: Family

## 2020-03-13 VITALS — BP 153/67 | HR 66 | Temp 98.1°F | Ht 64.5 in | Wt 154.0 lb

## 2020-03-13 DIAGNOSIS — E559 Vitamin D deficiency, unspecified: Secondary | ICD-10-CM

## 2020-03-13 DIAGNOSIS — I10 Essential (primary) hypertension: Secondary | ICD-10-CM | POA: Diagnosis not present

## 2020-03-13 DIAGNOSIS — E663 Overweight: Secondary | ICD-10-CM

## 2020-03-13 DIAGNOSIS — E785 Hyperlipidemia, unspecified: Secondary | ICD-10-CM

## 2020-03-13 DIAGNOSIS — Z8673 Personal history of transient ischemic attack (TIA), and cerebral infarction without residual deficits: Secondary | ICD-10-CM | POA: Diagnosis not present

## 2020-03-13 DIAGNOSIS — R933 Abnormal findings on diagnostic imaging of other parts of digestive tract: Secondary | ICD-10-CM

## 2020-03-13 DIAGNOSIS — N184 Chronic kidney disease, stage 4 (severe): Secondary | ICD-10-CM

## 2020-03-13 MED ORDER — AMLODIPINE BESYLATE 10 MG PO TABS: 10.0000 mg | ORAL_TABLET | Freq: Every day | ORAL | 3 refills | Status: DC

## 2020-03-13 MED ORDER — ATORVASTATIN CALCIUM 40 MG PO TABS: 40.0000 mg | ORAL_TABLET | Freq: Every day | ORAL | 3 refills | Status: DC

## 2020-03-13 NOTE — Progress Notes (Signed)
Subjective:    Patient ID: Andrea Ross, female    DOB: April 22, 1946, 74 y.o.   MRN: [id number]  Chief Complaint  Patient presents with  . Medical Management of Chronic Issues  . Hypertension   Pt presents to the  Chronic follow up. She has hx of CVA. She is followed by Nephrologists every 2-3 weeks for follow up.   She had US renal that showed hydronephrosis on right and left. She had follow up CT renal that showed no ureteral calculi, nephrolithiasis and cortical scarring/atrophy. There was also an accidental finding of Nodular density in the lingula, elevated LEFT hemidiaphragm. This may represent an area of atelectasis or scarring. However given more focal appearance on axial imaging would suggest follow-up Non-contrast chest CT at 6-12 months is recommended." We will plan for follow up CT in 6 months.  Hypertension This is a chronic problem. The current episode started more than 1 year ago. The problem has been waxing and waning since onset. The problem is uncontrolled. Pertinent negatives include no malaise/fatigue, peripheral edema or shortness of breath. Risk factors for coronary artery disease include dyslipidemia, obesity and sedentary lifestyle. Past treatments include beta blockers, calcium channel blockers and diuretics. The current treatment provides mild improvement. Hypertensive end-organ damage includes CVA.  Hyperlipidemia This is a chronic problem. The current episode started more than 1 year ago. The problem is controlled. Recent lipid tests were reviewed and are normal. Pertinent negatives include no shortness of breath. The current treatment provides moderate improvement of lipids. Risk factors for coronary artery disease include hypertension, dyslipidemia and post-menopausal.      Review of Systems  Constitutional: Negative for malaise/fatigue.  Respiratory: Negative for shortness of breath.   All other systems reviewed and are negative.      Objective:   Physical  Exam Vitals reviewed.  Constitutional:      General: She is not in acute distress.    Appearance: She is well-developed.  HENT:     Head: Normocephalic and atraumatic.     Right Ear: Tympanic membrane normal.     Left Ear: Tympanic membrane normal.  Eyes:     Pupils: Pupils are equal, round, and reactive to light.  Neck:     Thyroid: No thyromegaly.  Cardiovascular:     Rate and Rhythm: Normal rate and regular rhythm.     Heart sounds: Normal heart sounds. No murmur heard.   Pulmonary:     Effort: Pulmonary effort is normal. No respiratory distress.     Breath sounds: Normal breath sounds. No wheezing.  Abdominal:     General: Bowel sounds are normal. There is no distension.     Palpations: Abdomen is soft.     Tenderness: There is no abdominal tenderness.  Musculoskeletal:        General: No tenderness. Normal range of motion.     Cervical back: Normal range of motion and neck supple.  Skin:    General: Skin is warm and dry.  Neurological:     Mental Status: She is alert and oriented to person, place, and time.     Cranial Nerves: No cranial nerve deficit.     Deep Tendon Reflexes: Reflexes are normal and symmetric.  Psychiatric:        Behavior: Behavior normal.        Thought Content: Thought content normal.        Judgment: Judgment normal.       BP (!) 153/67   Pulse 66  Temp 98.1 F (36.7 C) (Temporal)   Ht 5' 4.5" (1.638 m)   Wt 154 lb (69.9 kg)   BMI 26.03 kg/m      Assessment & Plan:  Andrea Ross comes in today with chief complaint of Medical Management of Chronic Issues and Hypertension   Diagnosis and orders addressed:  1. Essential hypertension, benign - amLODipine (NORVASC) 10 MG tablet; Take 1 tablet (10 mg total) by mouth daily. (Needs to be seen before next refill)  Dispense: 90 tablet; Refill: 3  2. Hyperlipidemia, unspecified hyperlipidemia type - atorvastatin (LIPITOR) 40 MG tablet; Take 1 tablet (40 mg total) by mouth daily. Needs  to be seen for further refills.  Dispense: 90 tablet; Refill: 3  3. Chronic kidney disease (CKD), stage IV (severe) ([place])  4. History of cardioembolic cerebrovascular accident (CVA)  5. Vitamin D deficiency  6. Overweight (BMI 25.0-29.9)  7. Abnormal findings on diagnostic imaging of other parts of digestive tract Will go ahead and order CT scan to have completed in 6 months.  Follow up in 6 months  - CT CHEST WO CONTRAST; Future   Labs reviewed and will hold off since she has follow up with Nephrologists  Health Maintenance reviewed Diet and exercise encouraged  Follow up plan: 6 months    Evelina Dun, FNP

## 2020-03-13 NOTE — Patient Instructions (Signed)
Chronic Kidney Disease, Adult Chronic kidney disease (CKD) occurs when the kidneys become damaged slowly over a long period of time. The kidneys are a pair of organs that do many important jobs in the body, including:  Removing waste and extra fluid from the blood to make urine.  Making hormones that maintain the amount of fluid in tissues and blood vessels.  Maintaining the right amount of fluids and chemicals in the body. A small amount of kidney damage may not cause problems, but a large amount of damage may make it hard or impossible for the kidneys to work the way they should. If steps are not taken to slow down kidney damage or to stop it from getting worse, the kidneys may stop working permanently (end-stage renal disease or ESRD). Most of the time, CKD does not go away, but it can often be controlled. People who have CKD are usually able to live normal lives. What are the causes? The most common causes of this condition are diabetes and high blood pressure (hypertension). Other causes include:  Heart and blood vessel (cardiovascular) disease.  Kidney diseases, such as: ? Glomerulonephritis. ? Interstitial nephritis. ? Polycystic kidney disease. ? Renal vascular disease.  Diseases that affect the immune system.  Genetic diseases.  Medicines that damage the kidneys, such as anti-inflammatory medicines.  Being around or being in contact with poisonous (toxic) substances.  A kidney or urinary infection that occurs again and again (recurs).  Vasculitis. This is swelling or inflammation of the blood vessels.  A problem with urine flow that may be caused by: ? Cancer. ? Having kidney stones more than one time. ? An enlarged prostate, in males. What increases the risk? You are more likely to develop this condition if you:  Are older than age 60.  Are female.  Are African-American, Hispanic, Asian, Pacific Islander, or American Indian.  Are a current or former smoker.   Are obese.  Have a family history of kidney disease or failure.  Often take medicines that are damaging to the kidneys. What are the signs or symptoms? Symptoms of this condition include:  Swelling (edema) of the face, legs, ankles, or feet.  Tiredness (lethargy) and having less energy.  Nausea or vomiting.  Confusion or trouble concentrating.  Problems with urination, such as: ? Painful or burning feeling during urination. ? Decreased urine production. ? Frequent urination, especially at night. ? Bloody urine.  Muscle twitches and cramps, especially in the legs.  Shortness of breath.  Weakness.  Loss of appetite.  Metallic taste in the mouth.  Trouble sleeping.  Dry, itchy skin.  A low blood count (anemia).  Pale lining of the eyelids and surface of the eye (conjunctiva). Symptoms develop slowly and may not be obvious until the kidney damage becomes severe. It is possible to have kidney disease for years without having any symptoms. How is this diagnosed? This condition may be diagnosed based on:  Blood tests.  Urine tests.  Imaging tests, such as an ultrasound or CT scan.  A test in which a sample of tissue is removed from the kidneys to be examined under a microscope (kidney biopsy). These test results will help your health care provider determine how serious the CKD is. How is this treated? There is no cure for most cases of this condition, but treatment usually relieves symptoms and prevents or slows the progression of the disease. Treatment may include:  Making diet changes, which may require you to avoid alcohol, salty foods (sodium),   and foods that are high in potassium, calcium, and protein.  Medicines: ? To lower blood pressure. ? To control blood glucose. ? To relieve anemia. ? To relieve swelling. ? To protect your bones. ? To improve the balance of electrolytes in your blood.  Removing toxic waste from the body through types of dialysis, if  the kidneys can no longer do their job (kidney failure).  Managing any other conditions that are causing your CKD or making it worse. Follow these instructions at home: Medicines  Take over-the-counter and prescription medicines only as told by your health care provider. The dose of some medicines that you take may need to be adjusted.  Do not take any new medicines unless approved by your health care provider. Many medicines can worsen your kidney damage.  Do not take any vitamin and mineral supplements unless approved by your health care provider. Many nutritional supplements can worsen your kidney damage. General instructions  Follow your prescribed diet as told by your health care provider.  Do not use any products that contain nicotine or tobacco, such as cigarettes and e-cigarettes. If you need help quitting, ask your health care provider.  Monitor and track your blood pressure at home. Report changes in your blood pressure as told by your health care provider.  If you are being treated for diabetes, monitor and track your blood sugar (blood glucose) levels as told by your health care provider.  Maintain a healthy weight. If you need help with this, ask your health care provider.  Start or continue an exercise plan. Exercise at least 30 minutes a day, 5 days a week.  Keep your immunizations up to date as told by your health care provider.  Keep all follow-up visits as told by your health care provider. This is important. Where to find more information  American Association of Kidney Patients: www.aakp.org  National Kidney Foundation: www.kidney.org  American Kidney Fund: www.akfinc.org  [facility]: www.lifeoptions.org and www.kidneyschool.org Contact a health care provider if:  Your symptoms get worse.  You develop new symptoms. Get help right away if:  You develop symptoms of ESRD, which include: ? Headaches. ? Numbness in the hands or  feet. ? Easy bruising. ? Frequent hiccups. ? Chest pain. ? Shortness of breath. ? Lack of menstruation, in women.  You have a fever.  You have decreased urine production.  You have pain or bleeding when you urinate. Summary  Chronic kidney disease (CKD) occurs when the kidneys become damaged slowly over a long period of time.  The most common causes of this condition are diabetes and high blood pressure (hypertension).  There is no cure for most cases of this condition, but treatment usually relieves symptoms and prevents or slows the progression of the disease. Treatment may include a combination of medicines and lifestyle changes. This information is not intended to replace advice given to you by your health care provider. Make sure you discuss any questions you have with your health care provider. Document Revised: 08/01/2017 Document Reviewed: 09/26/2016 Elsevier Patient Education  2020 Else[employer] Inc.  

## 2020-03-16 ENCOUNTER — Other Ambulatory Visit: Payer: Self-pay | Admitting: Family

## 2020-03-21 ENCOUNTER — Other Ambulatory Visit: Payer: Self-pay

## 2020-03-21 ENCOUNTER — Other Ambulatory Visit: Payer: POS

## 2020-04-12 ENCOUNTER — Ambulatory Visit: Payer: POS | Admitting: Urology

## 2020-04-17 ENCOUNTER — Telehealth: Payer: Self-pay | Admitting: Family

## 2020-04-17 NOTE — Telephone Encounter (Signed)
Patient aware that she has refills at the pharmacy.

## 2020-04-17 NOTE — Telephone Encounter (Signed)
  Prescription Request  04/17/2020  What is the name of the medication or equipment? amLODipine (NORVASC) 10 MG tablet Send in 90 day supply  Have you contacted your pharmacy to request a refill? (if applicable) no  Which pharmacy would you like this sent to? [facility]   Patient notified that their request is being sent to the clinical staff for review and that they should receive a response within 2 business days.

## 2020-05-23 ENCOUNTER — Other Ambulatory Visit: Payer: POS

## 2020-05-23 ENCOUNTER — Other Ambulatory Visit: Payer: Self-pay

## 2020-06-16 ENCOUNTER — Other Ambulatory Visit: Payer: Self-pay

## 2020-06-16 ENCOUNTER — Other Ambulatory Visit: Payer: POS

## 2020-07-03 ENCOUNTER — Other Ambulatory Visit: Payer: Self-pay

## 2020-07-03 ENCOUNTER — Other Ambulatory Visit: Payer: POS

## 2020-07-13 ENCOUNTER — Other Ambulatory Visit: Payer: POS

## 2020-07-13 ENCOUNTER — Other Ambulatory Visit: Payer: Self-pay

## 2020-08-02 ENCOUNTER — Other Ambulatory Visit: Payer: POS

## 2020-08-02 ENCOUNTER — Other Ambulatory Visit: Payer: Self-pay

## 2020-09-08 ENCOUNTER — Ambulatory Visit (HOSPITAL_COMMUNITY): Payer: POS

## 2020-09-13 ENCOUNTER — Ambulatory Visit (INDEPENDENT_AMBULATORY_CARE_PROVIDER_SITE_OTHER): Payer: POS | Admitting: Family

## 2020-09-13 ENCOUNTER — Encounter: Payer: Self-pay | Admitting: Family

## 2020-09-13 DIAGNOSIS — R911 Solitary pulmonary nodule: Secondary | ICD-10-CM

## 2020-09-13 DIAGNOSIS — N184 Chronic kidney disease, stage 4 (severe): Secondary | ICD-10-CM

## 2020-09-13 DIAGNOSIS — E785 Hyperlipidemia, unspecified: Secondary | ICD-10-CM

## 2020-09-13 DIAGNOSIS — E663 Overweight: Secondary | ICD-10-CM

## 2020-09-13 DIAGNOSIS — I10 Essential (primary) hypertension: Secondary | ICD-10-CM

## 2020-09-13 DIAGNOSIS — E559 Vitamin D deficiency, unspecified: Secondary | ICD-10-CM

## 2020-09-13 NOTE — Progress Notes (Signed)
Virtual Visit via telephone Note Due to COVID-19 pandemic this visit was conducted virtually. This visit type was conducted due to national recommendations for restrictions regarding the COVID-19 Pandemic (e.g. social distancing, sheltering in place) in an effort to limit this patient's exposure and mitigate transmission in our community. All issues noted in this document were discussed and addressed.  A physical exam was not performed with this format.  I connected with Andrea Ross on 09/13/20 at 8:10 AM  by telephone and verified that I am speaking with the correct person using two identifiers. Andrea Ross is currently located at home and no one is currently with her during visit. The provider, Evelina Dun, FNP is located in their office at time of visit.  I discussed the limitations, risks, security and privacy concerns of performing an evaluation and management service by telephone and the availability of in person appointments. I also discussed with the patient that there may be a patient responsible charge related to this service. The patient expressed understanding and agreed to proceed.   History and Present Illness:  Pt presents to the  Chronic follow up. She has hx of CVA. She is followed by Nephrologists every month for follow up.  She has a repeat CT chest pending to follow up on accidental finding of nodular density in the lingula. She states she canceled her CT because of COVID, but will reschedule. Hypertension This is a chronic problem. The current episode started more than 1 year ago. The problem has been resolved since onset. The problem is controlled. Pertinent negatives include no malaise/fatigue, peripheral edema or shortness of breath. Risk factors for coronary artery disease include dyslipidemia and sedentary lifestyle. The current treatment provides moderate improvement. Hypertensive end-organ damage includes kidney disease. There is no history of CVA or heart  failure.  Hyperlipidemia This is a chronic problem. The current episode started more than 1 year ago. The problem is uncontrolled. Recent lipid tests were reviewed and are high. Exacerbating diseases include obesity. Pertinent negatives include no shortness of breath. Current antihyperlipidemic treatment includes statins. The current treatment provides moderate improvement of lipids. Risk factors for coronary artery disease include dyslipidemia, hypertension, a sedentary lifestyle and post-menopausal.      Review of Systems  Constitutional: Negative for malaise/fatigue.  Respiratory: Negative for shortness of breath.   All other systems reviewed and are negative.    Observations/Objective: No SOB or distress noted, up beat and happy  Assessment and Plan: 1. Essential hypertension, benign  2. Chronic kidney disease (CKD), stage IV (severe) ([place])  3. Hyperlipidemia, unspecified hyperlipidemia type  4. Overweight (BMI 25.0-29.9)  5. Vitamin D deficiency  6. Nodule of left lung Discussed in length of importance of making sure she reschedules her CT scan. States once COVID slows down she will.    Follow Up Instructions: 6 months and keep follow up with Nephrologists     I discussed the assessment and treatment plan with the patient. The patient was provided an opportunity to ask questions and all were answered. The patient agreed with the plan and demonstrated an understanding of the instructions.   The patient was advised to call back or seek an in-person evaluation if the symptoms worsen or if the condition fails to improve as anticipated.  The above assessment and management plan was discussed with the patient. The patient verbalized understanding of and has agreed to the management plan. Patient is aware to call the clinic if symptoms persist or worsen. Patient is  aware when to return to the clinic for a follow-up visit. Patient educated on when it is appropriate to go to the  emergency department.   Time call ended:  8:32 AM   I provided 22 minutes of non-face-to-face time during this encounter.    Evelina Dun, FNP

## 2020-10-11 ENCOUNTER — Other Ambulatory Visit: Payer: POS

## 2020-10-11 ENCOUNTER — Other Ambulatory Visit: Payer: Self-pay

## 2020-12-22 ENCOUNTER — Other Ambulatory Visit: Payer: Self-pay

## 2020-12-22 ENCOUNTER — Other Ambulatory Visit (HOSPITAL_COMMUNITY)
Admission: RE | Admit: 2020-12-22 | Discharge: 2020-12-22 | Disposition: A | Discharge status: Home / Self Care | Payer: POS | Source: Ambulatory Visit | Attending: Nephrology | Admitting: Nephrology

## 2020-12-22 DIAGNOSIS — E872 Acidosis: Secondary | ICD-10-CM | POA: Insufficient documentation

## 2020-12-22 DIAGNOSIS — I5032 Chronic diastolic (congestive) heart failure: Secondary | ICD-10-CM | POA: Diagnosis present

## 2020-12-22 DIAGNOSIS — N1832 Chronic kidney disease, stage 3b: Secondary | ICD-10-CM | POA: Diagnosis present

## 2020-12-22 LAB — RENAL FUNCTION PANEL
Albumin: 4.1 g/dL (ref 3.5–5.0)
Anion gap: 11 (ref 5–15)
BUN: 43 mg/dL — ABNORMAL HIGH (ref 8–23)
CO2: 23 mmol/L (ref 22–32)
Calcium: 9.4 mg/dL (ref 8.9–10.3)
Chloride: 103 mmol/L (ref 98–111)
Creatinine, Ser: 1.92 mg/dL — ABNORMAL HIGH (ref 0.44–1.00)
GFR, Estimated: 27 mL/min — ABNORMAL LOW (ref >60)
Glucose, Bld: 92 mg/dL (ref 70–99)
Phosphorus: 4.3 mg/dL (ref 2.5–4.6)
Potassium: 4.5 mmol/L (ref 3.5–5.1)
Sodium: 137 mmol/L (ref 135–145)

## 2020-12-22 LAB — CBC
HCT: 41 % (ref 36.0–46.0)
Hemoglobin: 13.4 g/dL (ref 12.0–15.0)
MCH: 28.5 pg (ref 26.0–34.0)
MCHC: 32.7 g/dL (ref 30.0–36.0)
MCV: 87 fL (ref 80.0–100.0)
Platelets: 235 10*3/uL (ref 150–400)
RBC: 4.71 MIL/uL (ref 3.87–5.11)
RDW: 13.8 % (ref 11.5–15.5)
WBC: 7.7 10*3/uL (ref 4.0–10.5)
nRBC: 0 % (ref 0.0–0.2)

## 2021-02-21 ENCOUNTER — Other Ambulatory Visit: Payer: POS

## 2021-02-21 ENCOUNTER — Other Ambulatory Visit: Payer: Self-pay

## 2021-04-01 ENCOUNTER — Other Ambulatory Visit: Payer: Self-pay | Admitting: Family

## 2021-04-01 DIAGNOSIS — I10 Essential (primary) hypertension: Secondary | ICD-10-CM

## 2021-04-01 DIAGNOSIS — E785 Hyperlipidemia, unspecified: Secondary | ICD-10-CM

## 2021-04-24 ENCOUNTER — Other Ambulatory Visit: Payer: POS

## 2021-04-24 ENCOUNTER — Other Ambulatory Visit: Payer: Self-pay

## 2021-04-26 ENCOUNTER — Other Ambulatory Visit: Payer: Self-pay | Admitting: Family

## 2021-04-26 DIAGNOSIS — I10 Essential (primary) hypertension: Secondary | ICD-10-CM

## 2021-04-26 NOTE — Telephone Encounter (Signed)
[facility]. NTBS 30 days given 04/02/21

## 2021-05-16 ENCOUNTER — Other Ambulatory Visit: Payer: Self-pay | Admitting: Family

## 2021-05-16 DIAGNOSIS — E785 Hyperlipidemia, unspecified: Secondary | ICD-10-CM

## 2021-05-16 NOTE — Telephone Encounter (Signed)
[facility]. NTBS 30 days given 04/22/21

## 2021-05-17 ENCOUNTER — Encounter: Payer: Self-pay | Admitting: Family

## 2021-05-17 ENCOUNTER — Ambulatory Visit: Payer: POS | Admitting: Family

## 2021-05-17 ENCOUNTER — Other Ambulatory Visit: Payer: Self-pay

## 2021-05-17 VITALS — BP 154/80 | HR 67 | Temp 98.1°F | Ht 64.0 in | Wt 147.4 lb

## 2021-05-17 DIAGNOSIS — E785 Hyperlipidemia, unspecified: Secondary | ICD-10-CM | POA: Diagnosis not present

## 2021-05-17 DIAGNOSIS — R911 Solitary pulmonary nodule: Secondary | ICD-10-CM

## 2021-05-17 DIAGNOSIS — E663 Overweight: Secondary | ICD-10-CM

## 2021-05-17 DIAGNOSIS — Z8673 Personal history of transient ischemic attack (TIA), and cerebral infarction without residual deficits: Secondary | ICD-10-CM | POA: Diagnosis not present

## 2021-05-17 DIAGNOSIS — I10 Essential (primary) hypertension: Secondary | ICD-10-CM

## 2021-05-17 DIAGNOSIS — N184 Chronic kidney disease, stage 4 (severe): Secondary | ICD-10-CM | POA: Diagnosis not present

## 2021-05-17 DIAGNOSIS — E559 Vitamin D deficiency, unspecified: Secondary | ICD-10-CM

## 2021-05-17 MED ORDER — AMLODIPINE BESYLATE 10 MG PO TABS: 10.0000 mg | ORAL_TABLET | Freq: Every day | ORAL | 2 refills | Status: DC

## 2021-05-17 MED ORDER — ATORVASTATIN CALCIUM 40 MG PO TABS: 40.0000 mg | ORAL_TABLET | Freq: Every day | ORAL | 2 refills | Status: DC

## 2021-05-17 NOTE — Progress Notes (Signed)
Subjective:    Patient ID: Andrea Ross, female    DOB: March 22, 1946, 75 y.o.   MRN: [id number]  Chief Complaint  Patient presents with   Medical Management of Chronic Issues   Pt presents to the office today for chronic follow up. She is followed by Nephrologists every 2 month for CKD. Has hx of CVA, with no long term effects. She was recently started on Hydralazine 25 mg BID. Her BP is still not controlled.   She has a repeat CT chest pending to follow up on accidental finding of nodular density in the lingula. She states she canceled her CT because of COVID and bad weather,  but needs a new order.  Hypertension This is a chronic problem. The current episode started more than 1 year ago. The problem has been waxing and waning since onset. The problem is uncontrolled. Pertinent negatives include no malaise/fatigue, peripheral edema or shortness of breath. Risk factors for coronary artery disease include dyslipidemia and obesity. The current treatment provides moderate improvement. Hypertensive end-organ damage includes kidney disease.  Hyperlipidemia This is a chronic problem. The current episode started more than 1 year ago. The problem is controlled. Pertinent negatives include no shortness of breath. Current antihyperlipidemic treatment includes statins. The current treatment provides moderate improvement of lipids. Risk factors for coronary artery disease include dyslipidemia, hypertension and a sedentary lifestyle.     Review of Systems  Constitutional:  Negative for malaise/fatigue.  Respiratory:  Negative for shortness of breath.   All other systems reviewed and are negative.     Objective:   Physical Exam Vitals reviewed.  Constitutional:      General: She is not in acute distress.    Appearance: She is well-developed.  HENT:     Head: Normocephalic and atraumatic.     Right Ear: Tympanic membrane normal.     Left Ear: Tympanic membrane normal.  Eyes:     Pupils: Pupils  are equal, round, and reactive to light.  Neck:     Thyroid: No thyromegaly.  Cardiovascular:     Rate and Rhythm: Normal rate and regular rhythm.     Heart sounds: Normal heart sounds. No murmur heard. Pulmonary:     Effort: Pulmonary effort is normal. No respiratory distress.     Breath sounds: Normal breath sounds. No wheezing.  Abdominal:     General: Bowel sounds are normal. There is no distension.     Palpations: Abdomen is soft.     Tenderness: There is no abdominal tenderness.  Musculoskeletal:        General: No tenderness. Normal range of motion.     Cervical back: Normal range of motion and neck supple.  Skin:    General: Skin is warm and dry.  Neurological:     Mental Status: She is alert and oriented to person, place, and time.     Cranial Nerves: No cranial nerve deficit.     Deep Tendon Reflexes: Reflexes are normal and symmetric.  Psychiatric:        Behavior: Behavior normal.        Thought Content: Thought content normal.        Judgment: Judgment normal.      BP (!) 154/80   Pulse 67   Temp 98.1 F (36.7 C) (Temporal)   Ht 5' 4" (1.626 m)   Wt 147 lb 6.4 oz (66.9 kg)   BMI 25.30 kg/m      Assessment & Plan:  Andrea Curet  Ross comes in today with chief complaint of Medical Management of Chronic Issues   Diagnosis and orders addressed:  1. Essential hypertension, benign - amLODipine (NORVASC) 10 MG tablet; Take 1 tablet (10 mg total) by mouth daily. (Needs to be seen before next refill)  Dispense: 90 tablet; Refill: 2 - CMP14+EGFR - CBC with Differential/Platelet  2. Hyperlipidemia, unspecified hyperlipidemia type - atorvastatin (LIPITOR) 40 MG tablet; Take 1 tablet (40 mg total) by mouth daily. Needs to be seen for further refills.  Dispense: 90 tablet; Refill: 2 - CMP14+EGFR - CBC with Differential/Platelet - Lipid panel  3. Chronic kidney disease (CKD), stage IV (severe) ([facility]) - CMP14+EGFR - CBC with Differential/Platelet  4. History of  cardioembolic cerebrovascular accident (CVA) - CMP14+EGFR - CBC with Differential/Platelet  5. Nodule of left lung - CMP14+EGFR - CBC with Differential/Platelet  6. Vitamin D deficiency - CMP14+EGFR - CBC with Differential/Platelet  7. Overweight (BMI 25.0-29.9) - CMP14+EGFR - CBC with Differential/Platelet   Labs pending Health Maintenance reviewed Diet and exercise encouraged  Follow up plan: 6 months    Christy Hawks, FNP    

## 2021-05-17 NOTE — Patient Instructions (Signed)

## 2021-05-18 LAB — CMP14+EGFR
ALT: 18 IU/L (ref 0–32)
AST: 16 IU/L (ref 0–40)
Albumin/Globulin Ratio: 1.5 (ref 1.2–2.2)
Albumin: 4.3 g/dL (ref 3.7–4.7)
Alkaline Phosphatase: 101 IU/L (ref 44–121)
BUN/Creatinine Ratio: 17 (ref 12–28)
BUN: 32 mg/dL — ABNORMAL HIGH (ref 8–27)
Bilirubin Total: 0.4 mg/dL (ref 0.0–1.2)
CO2: 24 mmol/L (ref 20–29)
Calcium: 9.6 mg/dL (ref 8.7–10.3)
Chloride: 98 mmol/L (ref 96–106)
Creatinine, Ser: 1.84 mg/dL — ABNORMAL HIGH (ref 0.57–1.00)
Globulin, Total: 2.8 g/dL (ref 1.5–4.5)
Glucose: 93 mg/dL (ref 65–99)
Potassium: 4.5 mmol/L (ref 3.5–5.2)
Sodium: 139 mmol/L (ref 134–144)
Total Protein: 7.1 g/dL (ref 6.0–8.5)
eGFR: 28 mL/min/{1.73_m2} — ABNORMAL LOW (ref >59)

## 2021-05-18 LAB — LIPID PANEL
Chol/HDL Ratio: 4 ratio (ref 0.0–4.4)
Cholesterol, Total: 161 mg/dL (ref 100–199)
HDL: 40 mg/dL (ref >39)
LDL Chol Calc (NIH): 108 mg/dL — ABNORMAL HIGH (ref 0–99)
Triglycerides: 69 mg/dL (ref 0–149)
VLDL Cholesterol Cal: 13 mg/dL (ref 5–40)

## 2021-05-18 LAB — CBC WITH DIFFERENTIAL/PLATELET
Basophils Absolute: 0 10*3/uL (ref 0.0–0.2)
Basos: 0 %
EOS (ABSOLUTE): 0.1 10*3/uL (ref 0.0–0.4)
Eos: 1 %
Hematocrit: 38.8 % (ref 34.0–46.6)
Hemoglobin: 12.8 g/dL (ref 11.1–15.9)
Immature Grans (Abs): 0 10*3/uL (ref 0.0–0.1)
Immature Granulocytes: 0 %
Lymphocytes Absolute: 2.1 10*3/uL (ref 0.7–3.1)
Lymphs: 29 %
MCH: 27.8 pg (ref 26.6–33.0)
MCHC: 33 g/dL (ref 31.5–35.7)
MCV: 84 fL (ref 79–97)
Monocytes Absolute: 0.9 10*3/uL (ref 0.1–0.9)
Monocytes: 12 %
Neutrophils Absolute: 4.1 10*3/uL (ref 1.4–7.0)
Neutrophils: 58 %
Platelets: 217 10*3/uL (ref 150–450)
RBC: 4.6 x10E6/uL (ref 3.77–5.28)
RDW: 13.9 % (ref 11.7–15.4)
WBC: 7.2 10*3/uL (ref 3.4–10.8)

## 2021-07-05 ENCOUNTER — Other Ambulatory Visit: Payer: POS

## 2021-07-05 ENCOUNTER — Other Ambulatory Visit: Payer: Self-pay

## 2021-09-10 ENCOUNTER — Other Ambulatory Visit: Payer: POS

## 2021-11-13 ENCOUNTER — Encounter: Payer: Self-pay | Admitting: Family

## 2021-11-13 ENCOUNTER — Other Ambulatory Visit: Payer: POS

## 2021-11-13 ENCOUNTER — Ambulatory Visit: Payer: POS | Admitting: Family

## 2021-11-13 VITALS — BP 176/69 | HR 60 | Temp 98.0°F | Ht 64.0 in | Wt 142.4 lb

## 2021-11-13 DIAGNOSIS — E785 Hyperlipidemia, unspecified: Secondary | ICD-10-CM | POA: Diagnosis not present

## 2021-11-13 DIAGNOSIS — Z8673 Personal history of transient ischemic attack (TIA), and cerebral infarction without residual deficits: Secondary | ICD-10-CM

## 2021-11-13 DIAGNOSIS — R911 Solitary pulmonary nodule: Secondary | ICD-10-CM

## 2021-11-13 DIAGNOSIS — I10 Essential (primary) hypertension: Secondary | ICD-10-CM | POA: Diagnosis not present

## 2021-11-13 DIAGNOSIS — M8580 Other specified disorders of bone density and structure, unspecified site: Secondary | ICD-10-CM

## 2021-11-13 DIAGNOSIS — N184 Chronic kidney disease, stage 4 (severe): Secondary | ICD-10-CM

## 2021-11-13 DIAGNOSIS — E559 Vitamin D deficiency, unspecified: Secondary | ICD-10-CM

## 2021-11-13 DIAGNOSIS — E663 Overweight: Secondary | ICD-10-CM

## 2021-11-13 MED ORDER — CLONIDINE HCL 0.1 MG PO TABS: 0.1000 mg | ORAL_TABLET | Freq: Two times a day (BID) | ORAL | 11 refills | Status: DC

## 2021-11-13 NOTE — Progress Notes (Signed)
? ?Subjective:  ? ? Patient ID: Andrea Ross, female    DOB: 11-04-1945, 76 y.o.   MRN: [id number] ? ?Chief Complaint  ?Patient presents with  ? Medical Management of Chronic Issues  ? ?Pt presents to the office today for chronic follow up. She is followed by Nephrologists every 2 month for CKD. Has hx of CVA, with no long term effects. She was recently started on Hydralazine 25 mg TID. Her BP is still not controlled.  ?  ?She had a repeat CT chest pending to follow up on accidental finding of nodular density in the lingula. She states she canceled her CT because of COVID and bad weather,  but needs a new order. She refuses this now.  ? ?She has osteopenia and does not take calcium and vit D regularly. She does take a multivitamin that has small amounts of these in them.  ?Hypertension ?This is a chronic problem. The current episode started more than 1 year ago. The problem has been waxing and waning since onset. The problem is uncontrolled. Associated symptoms include malaise/fatigue. Pertinent negatives include no peripheral edema or shortness of breath. Risk factors for coronary artery disease include dyslipidemia and sedentary lifestyle. The current treatment provides moderate improvement. There is no history of heart failure.  ?Hyperlipidemia ?This is a chronic problem. The current episode started more than 1 year ago. The problem is controlled. Pertinent negatives include no shortness of breath. Current antihyperlipidemic treatment includes statins. The current treatment provides moderate improvement of lipids. Risk factors for coronary artery disease include dyslipidemia, hypertension, a sedentary lifestyle and post-menopausal.  ? ? ? ?Review of Systems  ?Constitutional:  Positive for malaise/fatigue.  ?Respiratory:  Negative for shortness of breath.   ?All other systems reviewed and are negative. ? ?   ?Objective:  ? Physical Exam ?Vitals reviewed.  ?Constitutional:   ?   General: She is not in acute  distress. ?   Appearance: She is well-developed.  ?HENT:  ?   Head: Normocephalic and atraumatic.  ?   Right Ear: Tympanic membrane normal.  ?   Left Ear: Tympanic membrane normal.  ?Eyes:  ?   Pupils: Pupils are equal, round, and reactive to light.  ?Neck:  ?   Thyroid: No thyromegaly.  ?Cardiovascular:  ?   Rate and Rhythm: Normal rate and regular rhythm.  ?   Heart sounds: Normal heart sounds. No murmur heard. ?Pulmonary:  ?   Effort: Pulmonary effort is normal. No respiratory distress.  ?   Breath sounds: Normal breath sounds. No wheezing.  ?Abdominal:  ?   General: Bowel sounds are normal. There is no distension.  ?   Palpations: Abdomen is soft.  ?   Tenderness: There is no abdominal tenderness.  ?Musculoskeletal:     ?   General: No tenderness. Normal range of motion.  ?   Cervical back: Normal range of motion and neck supple.  ?Skin: ?   General: Skin is warm and dry.  ?Neurological:  ?   Mental Status: She is alert and oriented to person, place, and time.  ?   Cranial Nerves: No cranial nerve deficit.  ?   Deep Tendon Reflexes: Reflexes are normal and symmetric.  ?Psychiatric:     ?   Behavior: Behavior normal.     ?   Thought Content: Thought content normal.     ?   Judgment: Judgment normal.  ? ? ? ? ?BP (!) 198/68   Pulse 60  Temp 98 ?F (36.7 ?C) (Temporal)   Ht '5\' 4"'  (1.626 m)   Wt 142 lb 6.4 oz (64.6 kg)   BMI 24.44 kg/m?  ? ?   ?Assessment & Plan:  ?Andrea Ross comes in today with chief complaint of Medical Management of Chronic Issues ? ? ?Diagnosis and orders addressed: ? ?1. Essential hypertension, benign ?Will add clonidine 0.1 mg BID ?Follow up in 2 weeks ?-Dash diet information given ?-Exercise encouraged ?- Stress Management  ?-Continue current meds ?-RTO in 2 weeks  ?- CMP14+EGFR ?- CBC with Differential/Platelet ?- cloNIDine (CATAPRES) 0.1 MG tablet; Take 1 tablet (0.1 mg total) by mouth 2 (two) times daily.  Dispense: 60 tablet; Refill: 11 ? ?2. Chronic kidney disease (CKD), stage  IV (severe) ([facility]) ?- CMP14+EGFR ?- CBC with Differential/Platelet ?- cloNIDine (CATAPRES) 0.1 MG tablet; Take 1 tablet (0.1 mg total) by mouth 2 (two) times daily.  Dispense: 60 tablet; Refill: 11 ? ?3. Hyperlipidemia, unspecified hyperlipidemia type ?- CMP14+EGFR ?- CBC with Differential/Platelet ? ?4. Nodule of left lung ?- CMP14+EGFR ?- CBC with Differential/Platelet ? ?5. History of cardioembolic cerebrovascular accident (CVA) ?- CMP14+EGFR ?- CBC with Differential/Platelet ? ?6. Overweight (BMI 25.0-29.9) ?- CMP14+EGFR ?- CBC with Differential/Platelet ? ?7. Vitamin D deficiency ?- CMP14+EGFR ?- CBC with Differential/Platelet ? ?8. Osteopenia, unspecified location ?- CMP14+EGFR ?- CBC with Differential/Platelet ? ? ?Labs pending ?Health Maintenance reviewed ?Diet and exercise encouraged ? ?Follow up plan: ?2 weeks  ? ? ?Evelina Dun, FNP ? ? ? ?

## 2021-11-13 NOTE — Patient Instructions (Signed)

## 2021-11-19 ENCOUNTER — Telehealth: Payer: Self-pay | Admitting: Family

## 2021-11-19 NOTE — Telephone Encounter (Signed)
Pt called - ?Clonidine started last week  ? ?She feels dizzy / light headed  ?She has not been checking her BP at home, but does have a machine. ? ?Tele appt made for tomorrow with [facility]  ? ? ?

## 2021-11-20 ENCOUNTER — Encounter: Payer: Self-pay | Admitting: Family

## 2021-11-20 ENCOUNTER — Ambulatory Visit: Payer: POS | Admitting: Family

## 2021-11-20 VITALS — BP 182/96

## 2021-11-20 DIAGNOSIS — E663 Overweight: Secondary | ICD-10-CM

## 2021-11-20 DIAGNOSIS — N184 Chronic kidney disease, stage 4 (severe): Secondary | ICD-10-CM | POA: Diagnosis not present

## 2021-11-20 DIAGNOSIS — I10 Essential (primary) hypertension: Secondary | ICD-10-CM | POA: Diagnosis not present

## 2021-11-20 DIAGNOSIS — R42 Dizziness and giddiness: Secondary | ICD-10-CM

## 2021-11-20 NOTE — Progress Notes (Signed)
? ?Virtual Visit  Note ?Due to COVID-19 pandemic this visit was conducted virtually. This visit type was conducted due to national recommendations for restrictions regarding the COVID-19 Pandemic (e.g. social distancing, sheltering in place) in an effort to limit this patient's exposure and mitigate transmission in our community. All issues noted in this document were discussed and addressed.  A physical exam was not performed with this format. ? ?I connected with Andrea Ross on 11/20/21 at 1:30 pm  by telephone and verified that I am speaking with the correct person using two identifiers. Andrea Ross is currently located at home and no one is currently with her during visit. The provider, Evelina Dun, FNP is located in their office at time of visit. ? ?I discussed the limitations, risks, security and privacy concerns of performing an evaluation and management service by telephone and the availability of in person appointments. I also discussed with the patient that there may be a patient responsible charge related to this service. The patient expressed understanding and agreed to proceed. ? ?Ms. Benyo,you are scheduled for a virtual visit with your provider today.   ? ?Just as we do with appointments in the office, we must obtain your consent to participate.  Your consent will be active for this visit and any virtual visit you may have with one of our providers in the next 365 days.   ? ?If you have a MyChart account, I can also send a copy of this consent to you electronically.  All virtual visits are billed to your insurance company just like a traditional visit in the office.  As this is a virtual visit, video technology does not allow for your provider to perform a traditional examination.  This may limit your provider's ability to fully assess your condition.  If your provider identifies any concerns that need to be evaluated in person or the need to arrange testing such as labs, EKG, etc, we will  make arrangements to do so.   ? ?Although advances in technology are sophisticated, we cannot ensure that it will always work on either your end or our end.  If the connection with a video visit is poor, we may have to switch to a telephone visit.  With either a video or telephone visit, we are not always able to ensure that we have a secure connection.   I need to obtain your verbal consent now.   Are you willing to proceed with your visit today?  ? ?Andrea Ross has provided verbal consent on 11/20/2021 for a virtual visit (video or telephone). ? ? ?Evelina Dun, FNP ?11/20/2021  1:33 PM ? ? ? ?History and Present Illness: ? ?PT calls the office today with complaints of dizziness. She reports her BP at home is 182/96. She is on multiple BP medications, but denies any hypotension. She reports since starting the clonidine 0.1 mg BID she has been having dizziness that resolves after a few hours.  ? ?She is followed by Nephrologists for CKD. She had a renal US on 11/09/19 that showed right renal atophy.  ?Hypertension ?This is a chronic problem. The current episode started more than 1 year ago. The problem is unchanged. Pertinent negatives include no malaise/fatigue, peripheral edema or shortness of breath. Risk factors for coronary artery disease include dyslipidemia, obesity and sedentary lifestyle. Past treatments include beta blockers, angiotensin blockers, calcium channel blockers, diuretics and lifestyle changes. The current treatment provides no improvement.  ? ? ?Review of Systems  ?  Constitutional:  Negative for malaise/fatigue.  ?Respiratory:  Negative for shortness of breath.   ? ? ?Observations/Objective: ?No SOB or distress noted  ? ?Assessment and Plan: ?1. Essential hypertension, benign ?- [facility] referral [facility] [place] ? ?2. Chronic kidney disease (CKD), stage IV (severe) (HCC) ?- Ambulatory referral to [facility] [place] ? ?3. Overweight (BMI 25.0-29.9) ?- Ambulatory referral to [facility] [place] ? ?4. Dizziness ? ?Given new onset of dizziness, will tamper off clonindine. Decrease to once daily for 5 days then stop. ?Will place referral to HTN Clinic as her BP is not controlled. ?Keep Nephrologists follow up ?Low salt diet  ?Keep chronic follow up with me ? ?  ?I discussed the assessment and treatment plan with the patient. The patient was provided an opportunity to ask questions and all were answered. The patient agreed with the plan and demonstrated an understanding of the instructions. ?  ?The patient was advised to call back or seek an in-person evaluation if the symptoms worsen or if the condition fails to improve as anticipated. ? ?The above assessment and management plan was discussed with the patient. The patient verbalized understanding of and has agreed to the management plan. Patient is aware to call the clinic if symptoms persist or worsen. Patient is aware when to return to the clinic for a follow-up visit. Patient educated on when it is appropriate to go to the emergency department.  ? ?Time call ended:  1:46 pm  ? ?I provided 16 minutes of  non face-to-face time during this encounter. ? ? ? ?Evelina Dun, FNP ? ? ?

## 2021-11-29 ENCOUNTER — Encounter: Payer: Self-pay | Admitting: Family

## 2021-11-29 ENCOUNTER — Ambulatory Visit: Payer: POS | Admitting: Family

## 2021-11-29 ENCOUNTER — Ambulatory Visit (INDEPENDENT_AMBULATORY_CARE_PROVIDER_SITE_OTHER): Payer: POS

## 2021-11-29 VITALS — BP 146/77 | HR 85 | Temp 97.9°F | Ht 64.0 in | Wt 145.4 lb

## 2021-11-29 DIAGNOSIS — M8580 Other specified disorders of bone density and structure, unspecified site: Secondary | ICD-10-CM

## 2021-11-29 DIAGNOSIS — E663 Overweight: Secondary | ICD-10-CM

## 2021-11-29 DIAGNOSIS — N184 Chronic kidney disease, stage 4 (severe): Secondary | ICD-10-CM

## 2021-11-29 DIAGNOSIS — E785 Hyperlipidemia, unspecified: Secondary | ICD-10-CM

## 2021-11-29 DIAGNOSIS — I10 Essential (primary) hypertension: Secondary | ICD-10-CM | POA: Diagnosis not present

## 2021-11-29 DIAGNOSIS — Z8673 Personal history of transient ischemic attack (TIA), and cerebral infarction without residual deficits: Secondary | ICD-10-CM | POA: Diagnosis not present

## 2021-11-29 DIAGNOSIS — M8588 Other specified disorders of bone density and structure, other site: Secondary | ICD-10-CM

## 2021-11-29 DIAGNOSIS — E559 Vitamin D deficiency, unspecified: Secondary | ICD-10-CM

## 2021-11-29 DIAGNOSIS — Z780 Asymptomatic menopausal state: Secondary | ICD-10-CM

## 2021-11-29 NOTE — Patient Instructions (Signed)
Osteopenia ?Osteopenia is a loss of thickness (density) inside the bones. Another name for osteopenia is low bone mass. Mild osteopenia is a normal part of aging. It is not a disease, and it does not cause symptoms. ?However, if you have osteopenia and continue to lose bone mass, you could develop a condition that causes the bones to become thin and break more easily (osteoporosis). Osteoporosis can cause you to lose some height, have back pain, and have a stooped posture. Although osteopenia is not a disease, making changes to your lifestyle and diet can help to prevent osteopenia from developing into osteoporosis. ?What are the causes? ?Osteopenia is caused by loss of calcium in the bones. Bones are constantly changing. Old bone cells are continually being replaced with new bone cells. This process builds new bone. ?The mineral calcium is needed to build new bone and maintain bone density. Bone density is usually highest around age 61. After that, most people's bodies cannot replace all the bone they have lost with new bone. ?What increases the risk? ?You are more likely to develop this condition if: ?You are older than age 1. ?You are a woman who went through menopause early. ?You have a long illness that keeps you in bed. ?You do not get enough exercise. ?You lack certain nutrients (malnutrition). ?You have an overactive thyroid gland (hyperthyroidism). ?You use products that contain nicotine or tobacco, such as cigarettes, e-cigarettes and chewing tobacco, or you drink a lot of alcohol. ?You are taking medicines that weaken the bones, such as steroids. ?What are the signs or symptoms? ?This condition does not cause any symptoms. You may have a slightly higher risk for bone breaks (fractures), so getting fractures more easily than normal may be an indication of osteopenia. ?How is this diagnosed? ?This condition may be diagnosed based on an X-ray exam that measures bone density (dual-energy X-ray  absorptiometry, or DEXA). This test can measure bone density in your hips, spine, and wrists. ?Osteopenia has no symptoms, so this condition is usually diagnosed after a routine bone density screening test is done for osteoporosis. This routine screening is usually done for: ?Women who are age 66 or older. ?Men who are age 12 or older. ?If you have risk factors for osteopenia, you may have the screening test at an earlier age. ?How is this treated? ?Making dietary and lifestyle changes can lower your risk for osteoporosis. ?If you have severe osteopenia that is close to becoming osteoporosis, this condition can be treated with medicines and dietary supplements such as calcium and vitamin D. These supplements help to rebuild bone density. ?Follow these instructions at home: ?Eating and drinking ?Eat a diet that is high in calcium and vitamin D. ?Calcium is found in dairy products, beans, salmon, and leafy green vegetables like spinach and broccoli. ?Look for foods that have vitamin D and calcium added to them (fortified foods), such as orange juice, cereal, and bread. ? ?Lifestyle ?Do 30 minutes or more of a weight-bearing exercise every day, such as walking, jogging, or playing a sport. These types of exercises strengthen the bones. ?Do not use any products that contain nicotine or tobacco, such as cigarettes, e-cigarettes, and chewing tobacco. If you need help quitting, ask your health care provider. ?Do not drink alcohol if: ?Your health care provider tells you not to drink. ?You are pregnant, may be pregnant, or are planning to become pregnant. ?If you drink alcohol: ?Limit how much you use to: ?0-1 drink a day for women. ?0-2 drinks  a day for men. ?Be aware of how much alcohol is in your drink. In the [place] one drink equals one 12 oz bottle of beer (355 mL), one 5 oz glass of wine (148 mL), or one 1? oz glass of hard liquor (44 mL). ?General instructions ?Take over-the-counter and prescription medicines only as  told by your health care provider. These include vitamins and supplements. ?Take precautions at home to lower your risk of falling, such as: ?Keeping rooms well-lit and free of clutter, such as cords. ?Installing safety rails on stairs. ?Using rubber mats in the bathroom or other areas that are often wet or slippery. ?Keep all follow-up visits. This is important. ?Contact a health care provider if: ?You have not had a bone density screening for osteoporosis and you are: ?A woman who is age 98 or older. ?A man who is age 32 or older. ?You are a postmenopausal woman who has not had a bone density screening for osteoporosis. ?You are older than age 27 and you want to know if you should have bone density screening for osteoporosis. ?Summary ?Osteopenia is a loss of thickness (density) inside the bones. Another name for osteopenia is low bone mass. ?Osteopenia is not a disease, but it may increase your risk for a condition that causes the bones to become thin and break more easily (osteoporosis). ?You may be at risk for osteopenia if you are older than age 23 or if you are a woman who went through early menopause. ?Osteopenia does not cause any symptoms, but it can be diagnosed with a bone density screening test. ?Dietary and lifestyle changes are the first treatment for osteopenia. These may lower your risk for osteoporosis. ?This information is not intended to replace advice given to you by your health care provider. Make sure you discuss any questions you have with your health care provider. ?Document Revised: 02/03/2020 Document Reviewed: 02/03/2020 ?Elsevier Patient Education ? [place]. ? ?

## 2021-11-29 NOTE — Progress Notes (Signed)
? ?Subjective:  ? ? Patient ID: Andrea Ross, female    DOB: Jun 28, 1946, 76 y.o.   MRN: [id number] ? ?Chief Complaint  ?Patient presents with  ? Follow-up  ? Hypertension  ? Dizziness  ? ?Pt presents to the office today for chronic follow up and follow up on HTN. Her BP has been uncontrolled and we added clonidine 0.1 mg BID. She was having a lot of dizziness and we stopped the medications. Her BP is close to goal today, but continues to have intermittent dizziness.  ? ?She has hx of CVA.  ? ?We placed a referral to HTN clinic  and states her appointment is not until 04/01/22 and will also have a  stress test the same day. ? ?She has CKD and is followed by nephrologists. Her next appt is 12/13/21. ? ?She has osteopenia and her last dexa scan was 09/22/19. ?Hypertension ?This is a chronic problem. The current episode started more than 1 year ago. The problem has been resolved since onset. The problem is controlled. Pertinent negatives include no malaise/fatigue, peripheral edema or shortness of breath. Risk factors for coronary artery disease include dyslipidemia, obesity and sedentary lifestyle. The current treatment provides moderate improvement.  ?Dizziness ?This is a new problem. The current episode started more than 1 month ago. She has tried relaxation for the symptoms. The treatment provided mild relief.  ?Hyperlipidemia ?This is a chronic problem. The current episode started more than 1 year ago. The problem is controlled. Pertinent negatives include no shortness of breath. Current antihyperlipidemic treatment includes statins. The current treatment provides moderate improvement of lipids. Risk factors for coronary artery disease include dyslipidemia, hypertension, a sedentary lifestyle and post-menopausal.  ? ? ? ?Review of Systems  ?Constitutional:  Negative for malaise/fatigue.  ?Respiratory:  Negative for shortness of breath.   ?Neurological:  Positive for dizziness.  ?All other systems reviewed and are  negative. ? ?   ?Objective:  ? Physical Exam ?Vitals reviewed.  ?Constitutional:   ?   General: She is not in acute distress. ?   Appearance: She is well-developed.  ?HENT:  ?   Head: Normocephalic and atraumatic.  ?   Right Ear: Tympanic membrane normal.  ?   Left Ear: Tympanic membrane normal.  ?Eyes:  ?   Pupils: Pupils are equal, round, and reactive to light.  ?Neck:  ?   Thyroid: No thyromegaly.  ?Cardiovascular:  ?   Rate and Rhythm: Normal rate. Rhythm irregular.  ?   Heart sounds: Normal heart sounds. No murmur heard. ?Pulmonary:  ?   Effort: Pulmonary effort is normal. No respiratory distress.  ?   Breath sounds: Normal breath sounds. No wheezing.  ?Abdominal:  ?   General: Bowel sounds are normal. There is no distension.  ?   Palpations: Abdomen is soft.  ?   Tenderness: There is no abdominal tenderness.  ?Musculoskeletal:     ?   General: No tenderness. Normal range of motion.  ?   Cervical back: Normal range of motion and neck supple.  ?Skin: ?   General: Skin is warm and dry.  ?Neurological:  ?   Mental Status: She is alert and oriented to person, place, and time.  ?   Cranial Nerves: No cranial nerve deficit.  ?   Deep Tendon Reflexes: Reflexes are normal and symmetric.  ?Psychiatric:     ?   Behavior: Behavior normal.     ?   Thought Content: Thought content normal.     ?  Judgment: Judgment normal.  ? ? ? ?  BP (!) 146/77   Pulse 85   Temp 97.9 ?F (36.6 ?C) (Temporal)   Ht '5\' 4"'  (1.626 m)   Wt 145 lb 6.4 oz (66 kg)   BMI 24.96 kg/m?  ? ?Assessment & Plan:  ?Andrea Ross comes in today with chief complaint of Follow-up, Hypertension, and Dizziness ? ? ?Diagnosis and orders addressed: ? ?1. Essential hypertension, benign ?Keep Cardiologists appt ?- CMP14+EGFR ?- CBC with Differential/Platelet ? ?2. Chronic kidney disease (CKD), stage IV (severe) (HCC) ?Keep nephrologists appt ?Avoid NSAIDs ?- CMP14+EGFR ?- CBC with Differential/Platelet ? ?3. History of cardioembolic cerebrovascular accident  (CVA) ?- CMP14+EGFR ?- CBC with Differential/Platelet ? ?4. Hyperlipidemia, unspecified hyperlipidemia type ? ?- CMP14+EGFR ?- CBC with Differential/Platelet ? ?5. Overweight (BMI 25.0-29.9) ?- CMP14+EGFR ?- CBC with Differential/Platelet ? ?6. Vitamin D deficiency ?- CMP14+EGFR ?- CBC with Differential/Platelet ? ?7. Osteopenia, unspecified location ?- DG [facility] DEXA ?- CMP14+EGFR ?- CBC with Differential/Platelet ? ?8. Post-menopausal ?- DG [facility] DEXA ?- CMP14+EGFR ?- CBC with Differential/Platelet ? ? ?Labs pending ?Health Maintenance reviewed ?Diet and exercise encouraged ? ?Follow up plan: ?3 months  ? ? ?Evelina Dun, FNP ? ? ?

## 2021-11-30 LAB — CMP14+EGFR
ALT: 14 IU/L (ref 0–32)
AST: 16 IU/L (ref 0–40)
Albumin/Globulin Ratio: 1.8 (ref 1.2–2.2)
Albumin: 4.5 g/dL (ref 3.7–4.7)
Alkaline Phosphatase: 102 IU/L (ref 44–121)
BUN/Creatinine Ratio: 14 (ref 12–28)
BUN: 24 mg/dL (ref 8–27)
Bilirubin Total: 0.3 mg/dL (ref 0.0–1.2)
CO2: 20 mmol/L (ref 20–29)
Calcium: 9.4 mg/dL (ref 8.7–10.3)
Chloride: 104 mmol/L (ref 96–106)
Creatinine, Ser: 1.7 mg/dL — ABNORMAL HIGH (ref 0.57–1.00)
Globulin, Total: 2.5 g/dL (ref 1.5–4.5)
Glucose: 89 mg/dL (ref 70–99)
Potassium: 4.9 mmol/L (ref 3.5–5.2)
Sodium: 142 mmol/L (ref 134–144)
Total Protein: 7 g/dL (ref 6.0–8.5)
eGFR: 31 mL/min/{1.73_m2} — ABNORMAL LOW (ref >59)

## 2021-11-30 LAB — CBC WITH DIFFERENTIAL/PLATELET
Basophils Absolute: 0 10*3/uL (ref 0.0–0.2)
Basos: 1 %
EOS (ABSOLUTE): 0.1 10*3/uL (ref 0.0–0.4)
Eos: 2 %
Hematocrit: 38.6 % (ref 34.0–46.6)
Hemoglobin: 12.7 g/dL (ref 11.1–15.9)
Immature Grans (Abs): 0 10*3/uL (ref 0.0–0.1)
Immature Granulocytes: 0 %
Lymphocytes Absolute: 1.7 10*3/uL (ref 0.7–3.1)
Lymphs: 27 %
MCH: 28.5 pg (ref 26.6–33.0)
MCHC: 32.9 g/dL (ref 31.5–35.7)
MCV: 87 fL (ref 79–97)
Monocytes Absolute: 0.6 10*3/uL (ref 0.1–0.9)
Monocytes: 10 %
Neutrophils Absolute: 3.8 10*3/uL (ref 1.4–7.0)
Neutrophils: 60 %
Platelets: 201 10*3/uL (ref 150–450)
RBC: 4.45 x10E6/uL (ref 3.77–5.28)
RDW: 13 % (ref 11.7–15.4)
WBC: 6.3 10*3/uL (ref 3.4–10.8)

## 2021-12-06 ENCOUNTER — Ambulatory Visit (INDEPENDENT_AMBULATORY_CARE_PROVIDER_SITE_OTHER): Payer: POS | Admitting: *Deleted

## 2021-12-06 ENCOUNTER — Ambulatory Visit: Payer: POS

## 2021-12-06 VITALS — BP 204/84

## 2021-12-06 DIAGNOSIS — I10 Essential (primary) hypertension: Secondary | ICD-10-CM

## 2022-01-24 IMAGING — US US RENAL
1 series · 14 of 25 positions shown · non-contrast
Comparison: Renal ultrasound dated November 09, 2018.

CLINICAL DATA: ATN and CKD.

EXAM:
RENAL / URINARY TRACT ULTRASOUND COMPLETE

[Series 1: us renal · 14 of 50 slices shown]
[im 1/50]
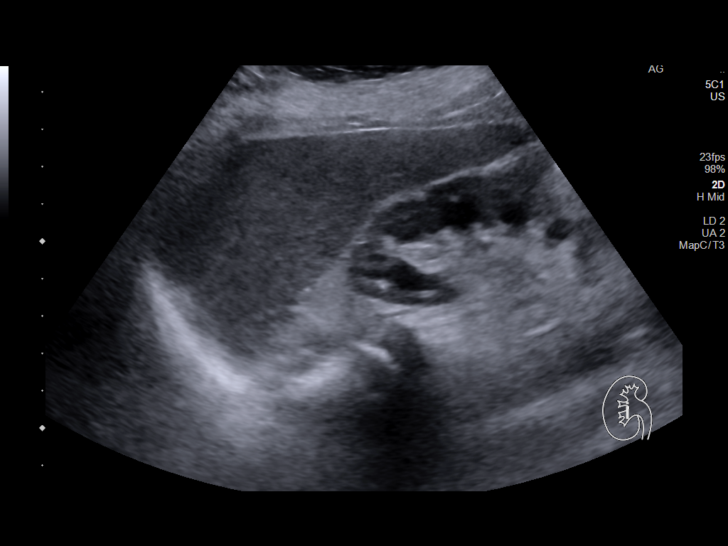
[im 5/50]
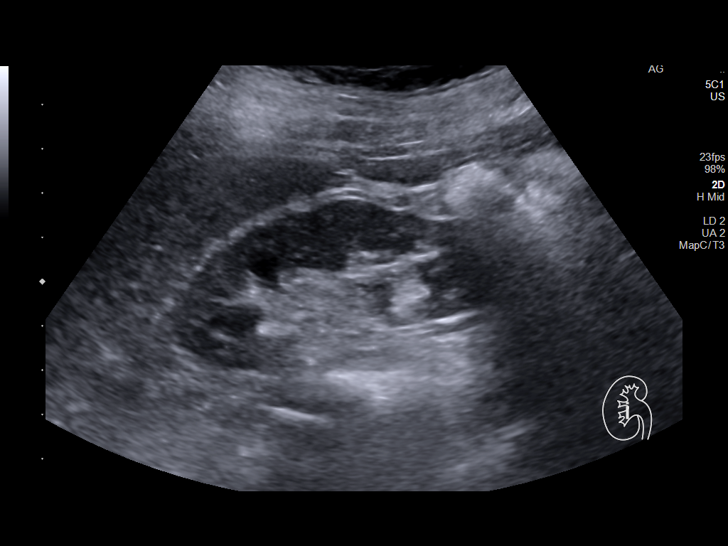
[im 9/50]
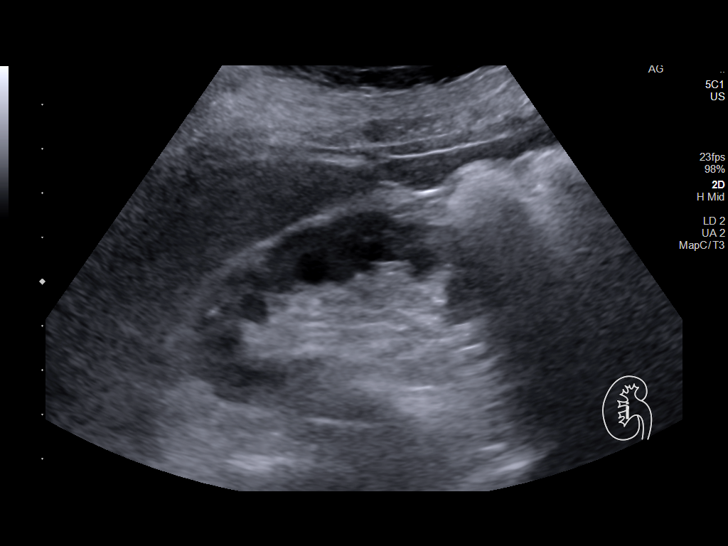
[im 13/50]
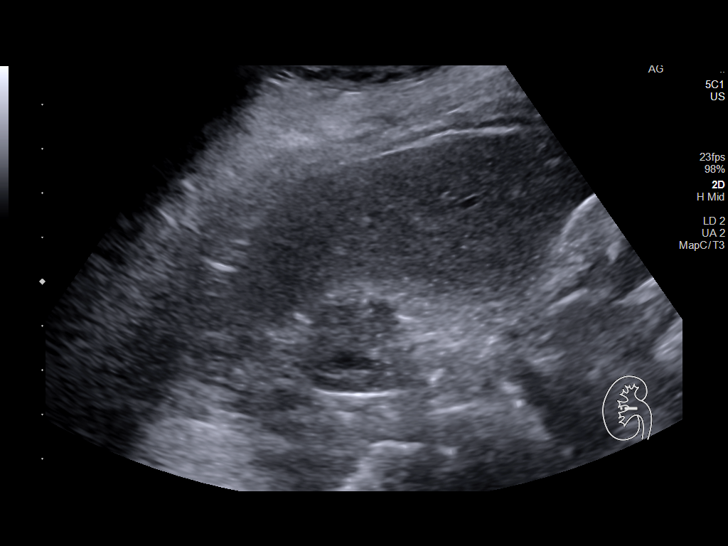
[im 17/50]
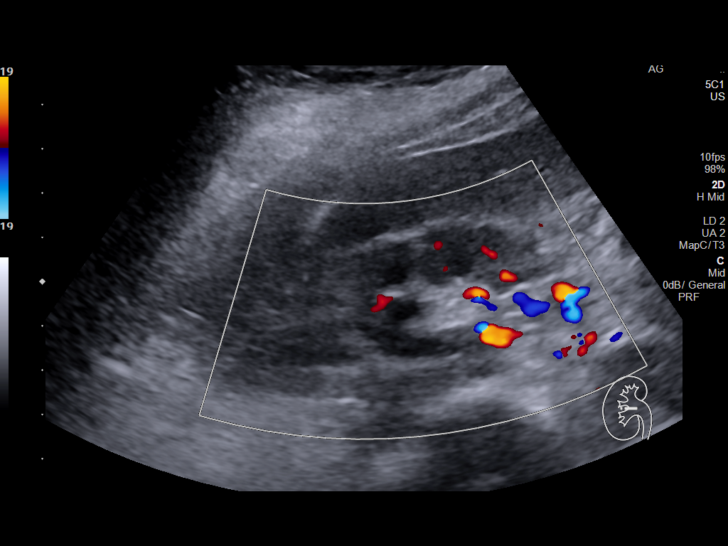
[im 19/50]
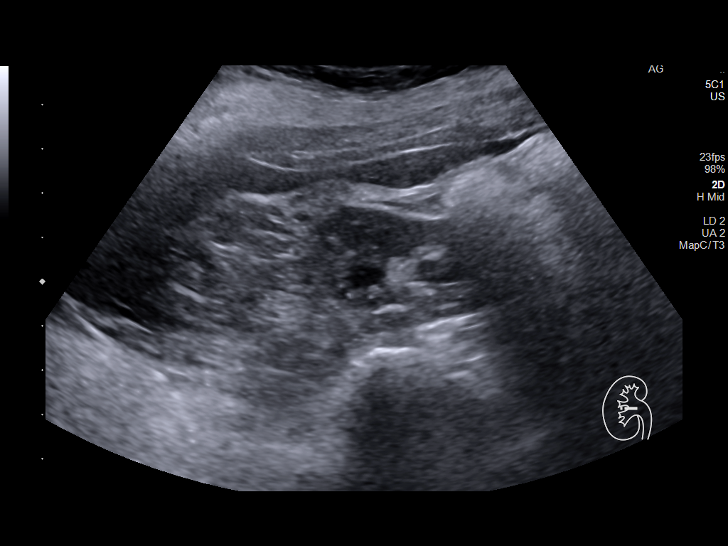
[im 23/50]
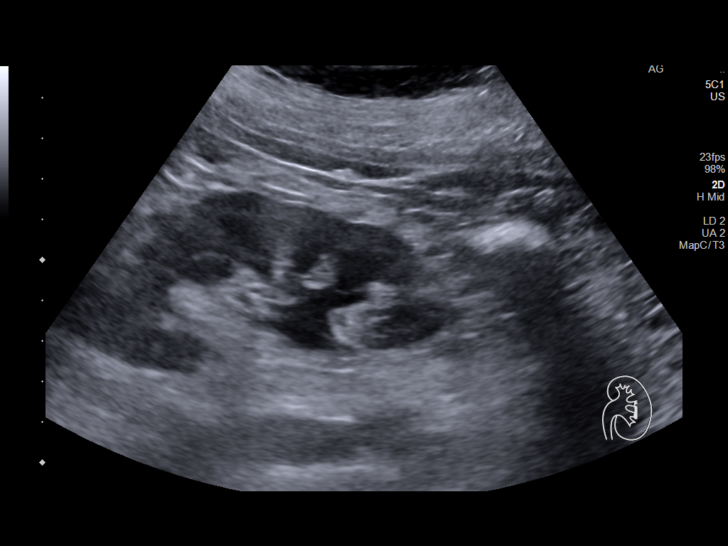
[im 27/50]
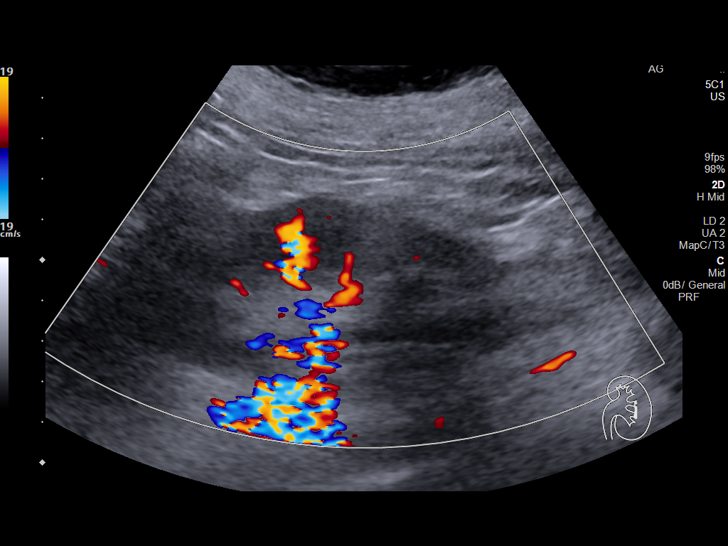
[im 31/50]
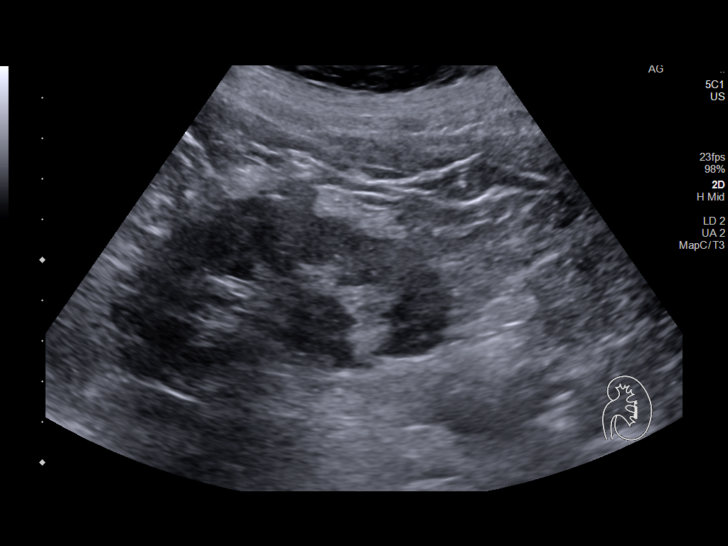
[im 33/50]
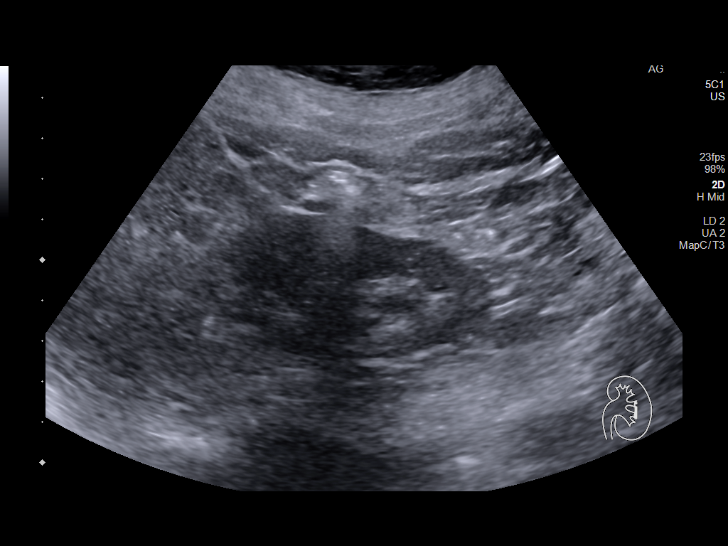
[im 37/50]
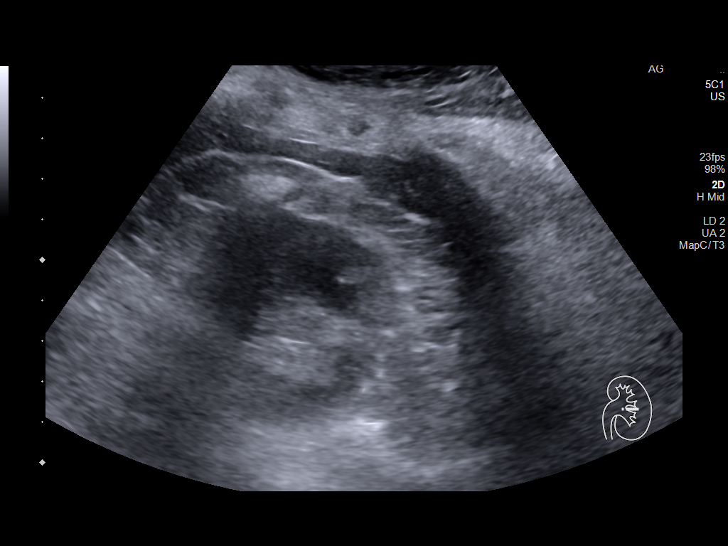
[im 41/50]
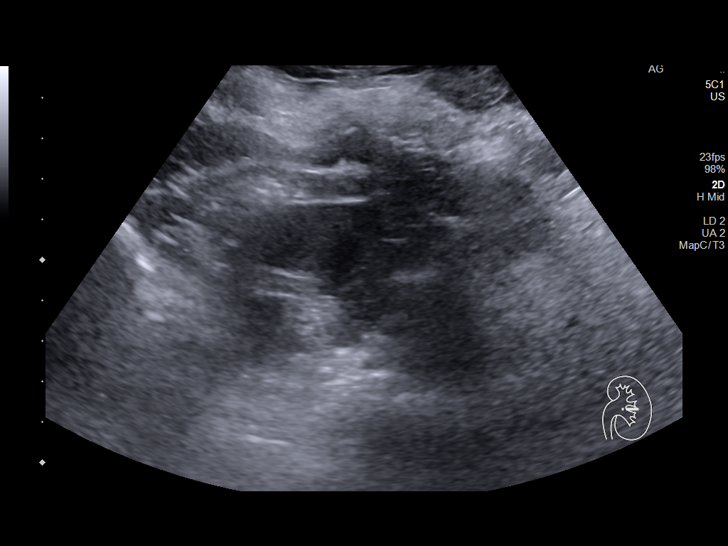
[im 45/50]
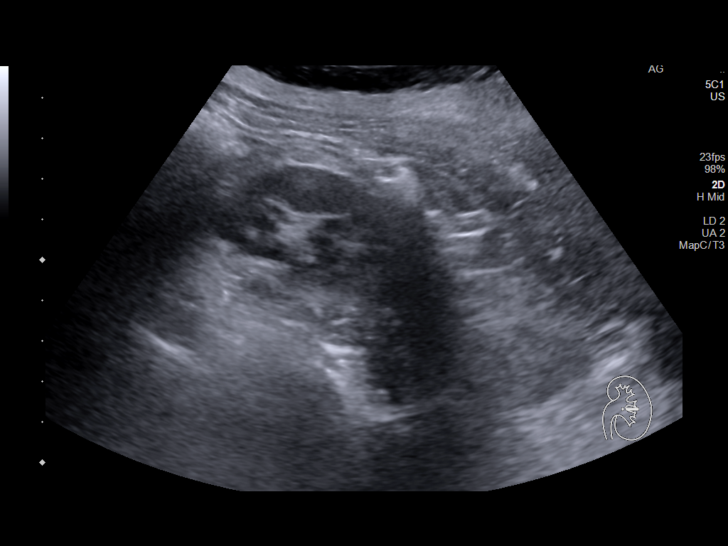
[im 50/50]
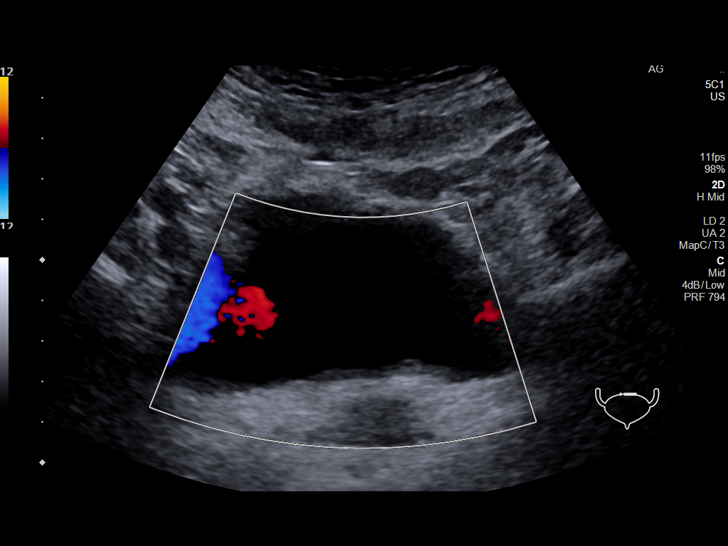

[14 of 25 positions shown; findings below may reference images not displayed]

FINDINGS: Right Kidney:

Renal measurements: 7.9 x 3.1 x 3.9 cm = volume: 50 mL .
Echogenicity within normal limits. No mass or hydronephrosis
visualized.

Left Kidney:

Renal measurements: 9.8 x 4.3 x 4.5 cm = volume: 99 mL. Echogenicity
within normal limits. Unchanged mild hydronephrosis involving the
lower pole. No mass visualized.

Bladder:

Appears normal for degree of bladder distention.

Other:

None.
IMPRESSION: 1. Unchanged mild left hydronephrosis involving the lower pole.
2. Unchanged right renal atrophy.

## 2022-02-13 ENCOUNTER — Other Ambulatory Visit: Payer: Self-pay | Admitting: Family

## 2022-02-13 DIAGNOSIS — E785 Hyperlipidemia, unspecified: Secondary | ICD-10-CM

## 2022-02-15 IMAGING — CT CT RENAL STONE PROTOCOL
2 of 4 series · 16 of 46 positions shown, 18 images · non-contrast
Comparison: Renal sonogram February 16, 2020

CLINICAL DATA: Decreased urine output.  No pain or hematuria.

EXAM:
CT ABDOMEN AND PELVIS WITHOUT CONTRAST
TECHNIQUE: Multidetector CT imaging of the abdomen and pelvis was performed
following the standard protocol without IV contrast.

[Series 2: axial st · axial · 0.79mm/px · z∈[+687,+1062]mm · 13 of 87 slices shown, 15 images]
[im 6/87  soft-tissue]
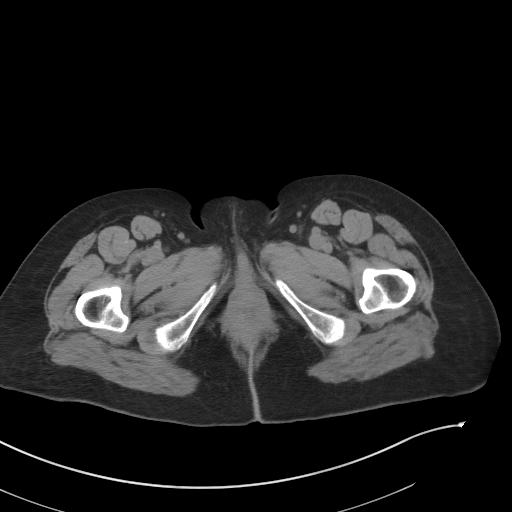
[im 6/87  bone]
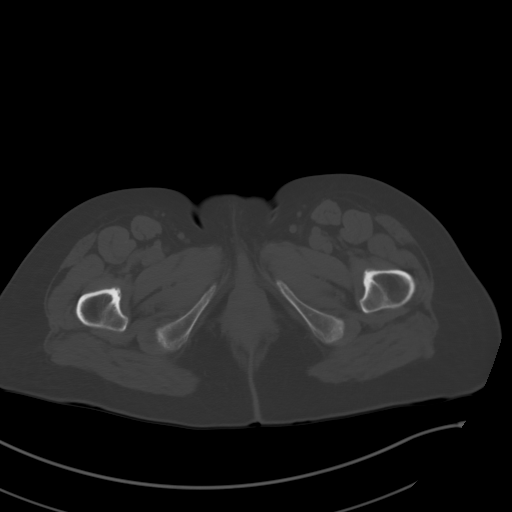
[im 11/87  soft-tissue]
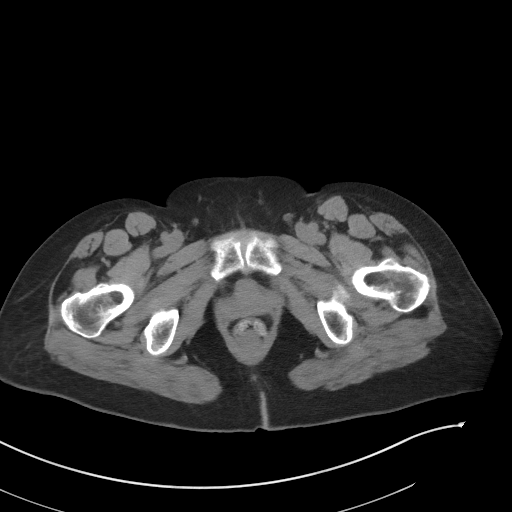
[im 21/87  soft-tissue]
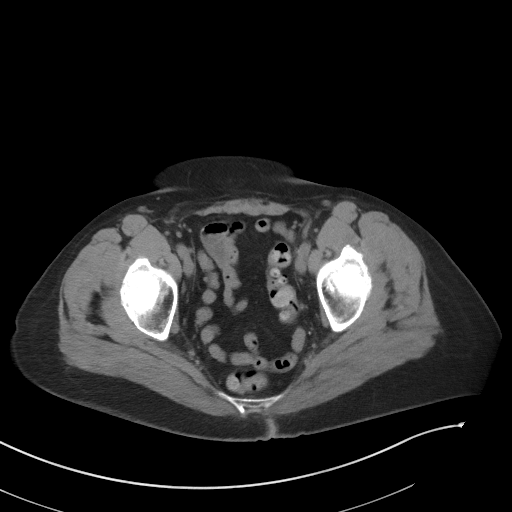
[im 26/87  soft-tissue]
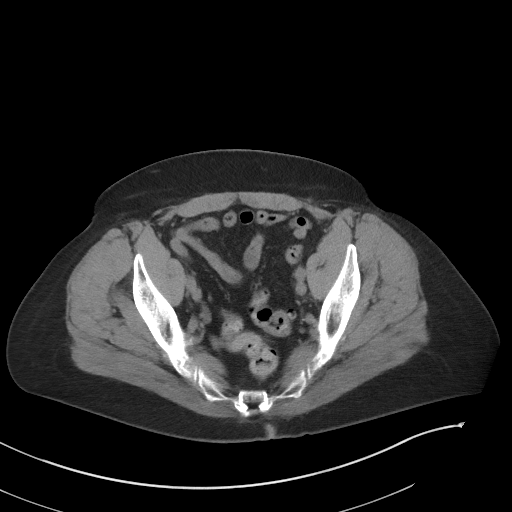
[im 31/87  soft-tissue]
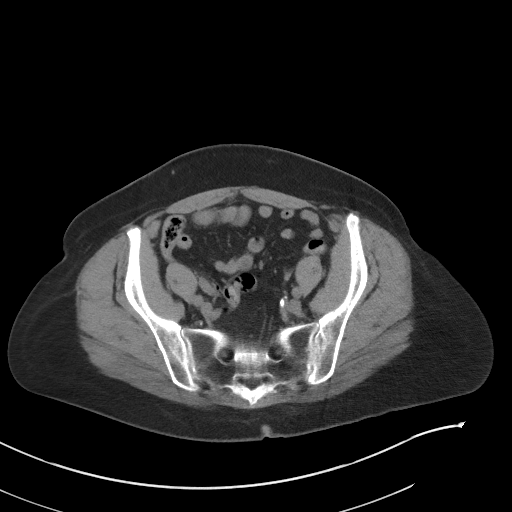
[im 36/87  soft-tissue]
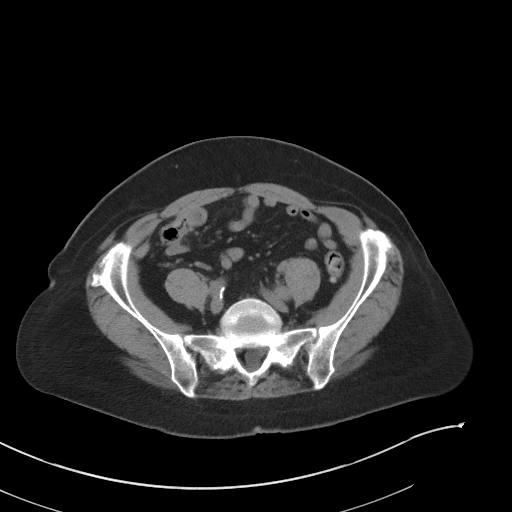
[im 46/87  soft-tissue]
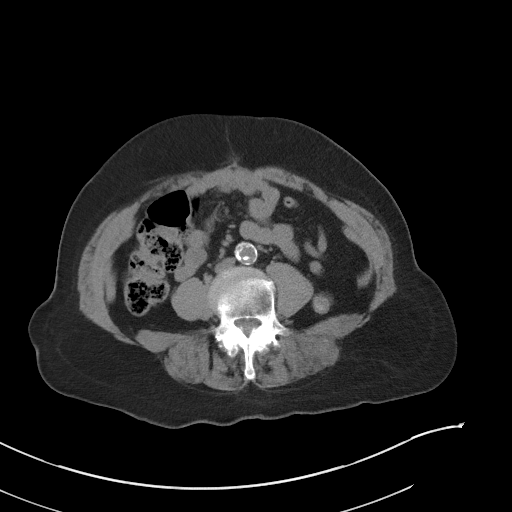
[im 51/87  soft-tissue]
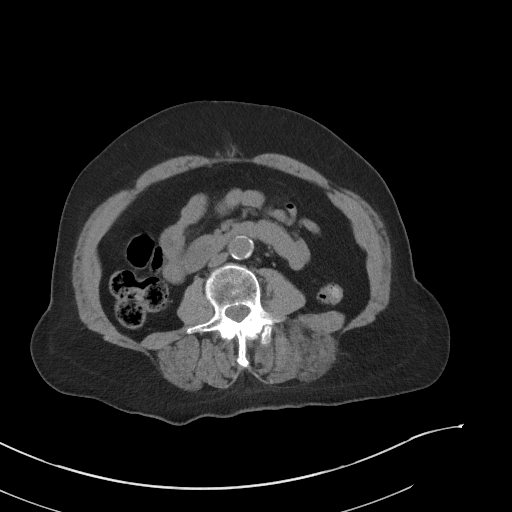
[im 56/87  soft-tissue]
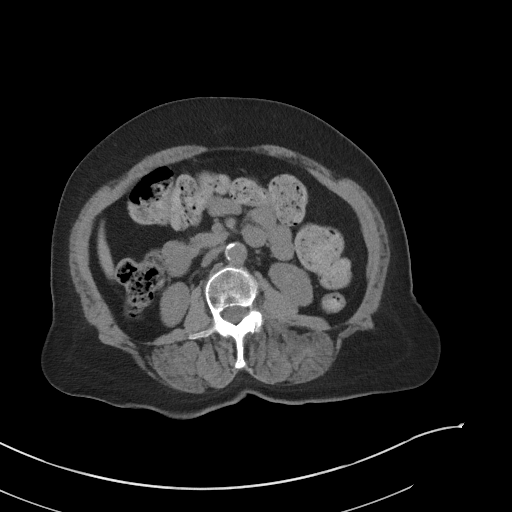
[im 56/87  bone]
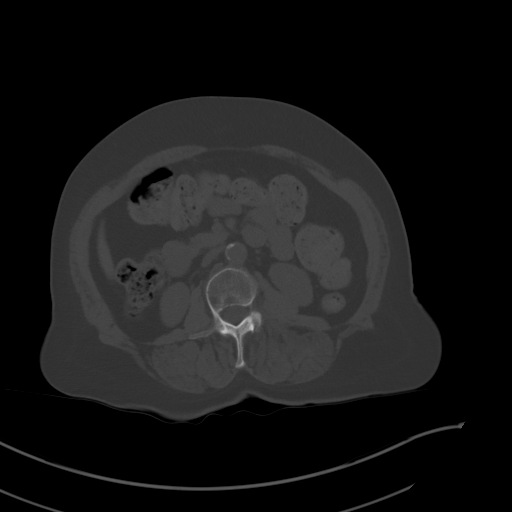
[im 61/87  soft-tissue]
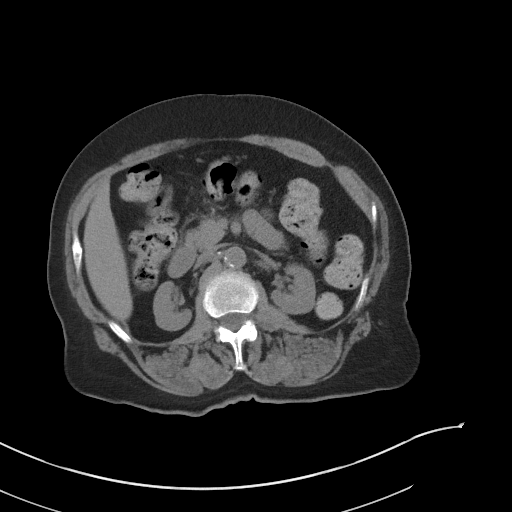
[im 66/87  soft-tissue]
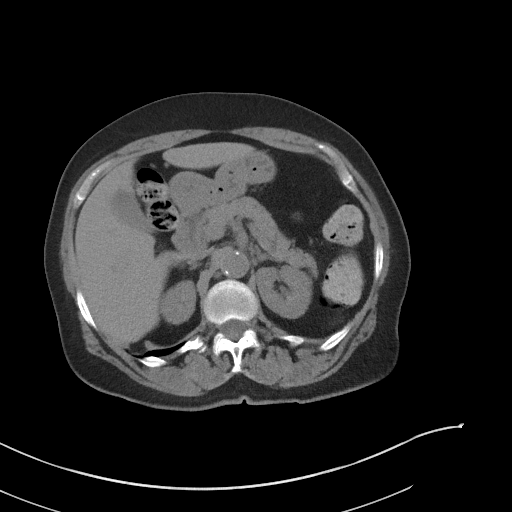
[im 76/87  soft-tissue]
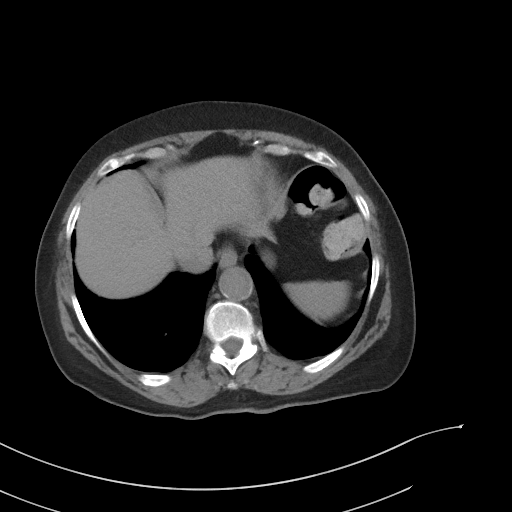
[im 81/87  soft-tissue]
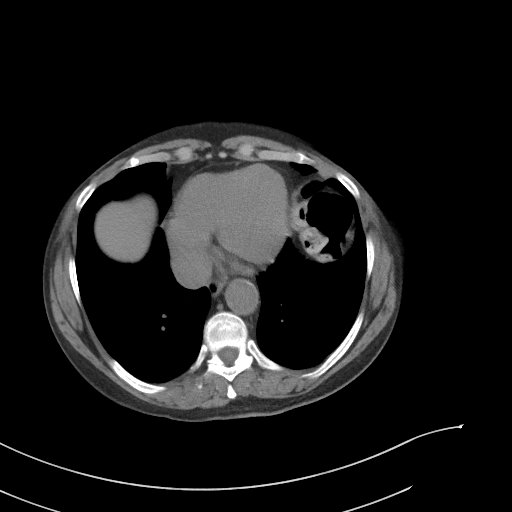

[Series 5: coronal st · coronal · 0.72mm/px · 3 of 101 slices shown]
[im 34/101  soft-tissue]
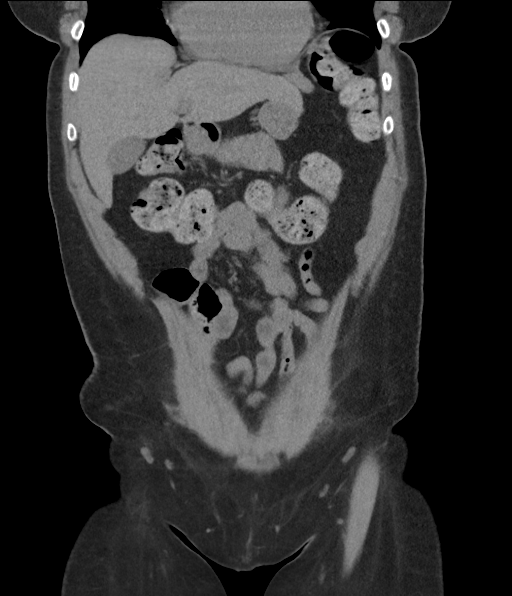
[im 45/101  soft-tissue]
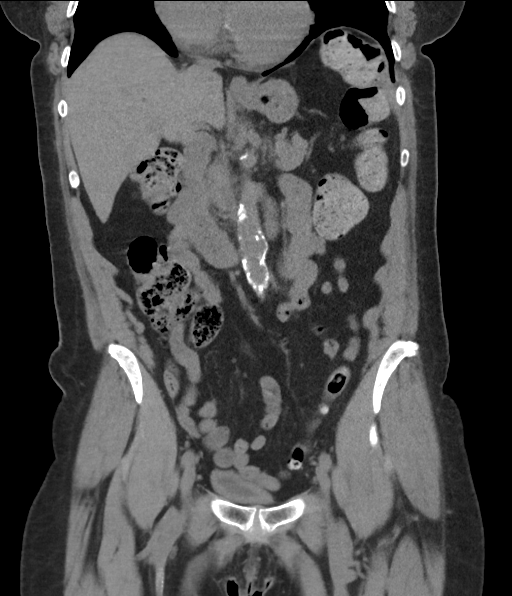
[im 56/101  soft-tissue]
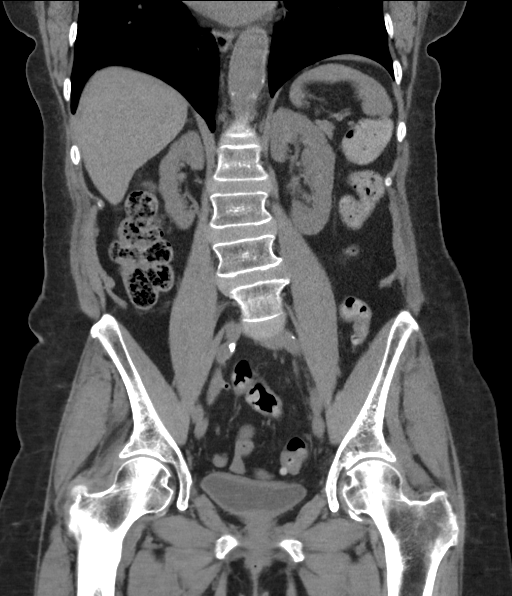

[16 of 46 positions shown; findings below may reference images not displayed]

FINDINGS: Lower chest: Nodular density in the lingula, elevated LEFT
hemidiaphragm. No effusion. Area measuring 7 x 8 mm.

Hepatobiliary: Liver is normal in size and contour. No
pericholecystic stranding. No biliary duct dilation grossly, not
well assessed due to lack of contrast.

Pancreas: Pancreas is normal in terms of contour and without signs
of inflammation or ductal distension

Spleen: Spleen normal in size and contour.

Adrenals/Urinary Tract: Adrenal glands are normal.

Mild cortical scarring on the LEFT and moderate atrophy of the RIGHT
kidney.

Nephrolithiasis interpolar LEFT kidney approximately 2 mm calculus
to 3 mm. No signs of ureteral calculi. No hydronephrosis. No
perinephric

Urinary bladder under distended limiting assessment.

Stomach/Bowel: Stomach under distended limiting assessment. Small
bowel normal caliber. Appendix is normal extending in the RIGHT
hemipelvis. Colonic diverticulosis. No pericolonic stranding.

Vascular/Lymphatic: Extensive atheromatous plaque of the abdominal
aorta. No aneurysmal dilation. No adenopathy in the retroperitoneum
or in the upper abdomen.

No pelvic lymphadenopathy.

Reproductive: Post hysterectomy.  No adnexal mass.

Other: Small ventral hernia contains small amount of fat with
minimal stranding in the subxiphoid region.

No ascites.

Musculoskeletal: No acute musculoskeletal process. Spinal
degenerative changes. Osteopenia. Grade 1 anterolisthesis of L3 on
L4
IMPRESSION: 1. No signs of ureteral calculi or hydronephrosis.
2. Nephrolithiasis and cortical scarring/atrophy greatest on the
RIGHT.
3. Nodular density in the lingula, elevated LEFT hemidiaphragm. This
may represent an area of atelectasis or scarring. However given more
focal appearance on axial imaging would suggest follow-up
Non-contrast chest CT at 6-12 months is recommended. If the nodule
is stable at time of repeat CT, then future CT at 18-24 months (from
today's scan) is considered optional for low-risk patients, but is
recommended for high-risk patients. This recommendation follows the
consensus statement: Guidelines for Management of Incidental
Pulmonary Nodules Detected on CT Images: From the [HOSPITAL]
4. Aortic atherosclerosis.

Aortic Atherosclerosis (EX6NI-50T.T).

## 2022-02-20 ENCOUNTER — Other Ambulatory Visit: Payer: POS

## 2022-03-02 ENCOUNTER — Other Ambulatory Visit: Payer: Self-pay | Admitting: Family

## 2022-03-02 DIAGNOSIS — I10 Essential (primary) hypertension: Secondary | ICD-10-CM

## 2022-03-04 ENCOUNTER — Ambulatory Visit: Payer: POS | Admitting: Family

## 2022-03-07 ENCOUNTER — Ambulatory Visit: Payer: POS | Admitting: Family

## 2022-03-07 ENCOUNTER — Encounter: Payer: Self-pay | Admitting: Family

## 2022-03-07 VITALS — BP 147/81 | HR 59 | Temp 98.2°F | Ht 64.0 in | Wt 141.0 lb

## 2022-03-07 DIAGNOSIS — Z8673 Personal history of transient ischemic attack (TIA), and cerebral infarction without residual deficits: Secondary | ICD-10-CM | POA: Diagnosis not present

## 2022-03-07 DIAGNOSIS — I10 Essential (primary) hypertension: Secondary | ICD-10-CM | POA: Diagnosis not present

## 2022-03-07 DIAGNOSIS — N184 Chronic kidney disease, stage 4 (severe): Secondary | ICD-10-CM

## 2022-03-07 DIAGNOSIS — E559 Vitamin D deficiency, unspecified: Secondary | ICD-10-CM

## 2022-03-07 DIAGNOSIS — M8580 Other specified disorders of bone density and structure, unspecified site: Secondary | ICD-10-CM

## 2022-03-07 DIAGNOSIS — E785 Hyperlipidemia, unspecified: Secondary | ICD-10-CM

## 2022-03-07 DIAGNOSIS — E663 Overweight: Secondary | ICD-10-CM

## 2022-03-07 MED ORDER — ATORVASTATIN CALCIUM 40 MG PO TABS: 40.0000 mg | ORAL_TABLET | Freq: Every day | ORAL | 1 refills | Status: DC

## 2022-03-07 NOTE — Progress Notes (Signed)
Subjective:    Patient ID: Andrea Ross, female    DOB: 31-Mar-1946, 76 y.o.   MRN: [id number]  Chief Complaint  Patient presents with   Medical Management of Chronic Issues   Hypertension   Chronic Kidney Disease   Dizziness   Pt presents to the office today for chronic follow up and follow up on HTN. Her BP is elevated today. Her nephrologists increased her hydralazine to 100 mg TID from 50 mg TID. States after she takes this increased dose she feels blurred vision and a "tightness around her head" that lasts for 20-30 mins.   She has hx of CVA.    We placed a referral to HTN clinic and states her appointment is not until 04/01/22 and will also have a  stress test the same day.   She has CKD and is followed by nephrologists. Her next appt is next month.    She has osteopenia and her last dexa scan was 11/29/21. Hypertension This is a chronic problem. The current episode started more than 1 year ago. The problem has been waxing and waning since onset. The problem is uncontrolled. Associated symptoms include malaise/fatigue. Pertinent negatives include no peripheral edema or shortness of breath. Risk factors for coronary artery disease include dyslipidemia and sedentary lifestyle. The current treatment provides no improvement. Hypertensive end-organ damage includes kidney disease and CVA.  Dizziness  Hyperlipidemia This is a chronic problem. The current episode started more than 1 year ago. The problem is uncontrolled. Pertinent negatives include no shortness of breath. Current antihyperlipidemic treatment includes statins. The current treatment provides moderate improvement of lipids. Risk factors for coronary artery disease include dyslipidemia, hypertension, a sedentary lifestyle and post-menopausal.      Review of Systems  Constitutional:  Positive for malaise/fatigue.  Respiratory:  Negative for shortness of breath.   Neurological:  Positive for dizziness.  All other systems  reviewed and are negative.      Objective:   Physical Exam Vitals reviewed.  Constitutional:      General: She is not in acute distress.    Appearance: She is well-developed.  HENT:     Head: Normocephalic and atraumatic.     Right Ear: Tympanic membrane normal.     Left Ear: Tympanic membrane normal.  Eyes:     Pupils: Pupils are equal, round, and reactive to light.  Neck:     Thyroid: No thyromegaly.  Cardiovascular:     Rate and Rhythm: Normal rate and regular rhythm.     Heart sounds: Normal heart sounds. No murmur heard. Pulmonary:     Effort: Pulmonary effort is normal. No respiratory distress.     Breath sounds: Normal breath sounds. No wheezing.  Abdominal:     General: Bowel sounds are normal. There is no distension.     Palpations: Abdomen is soft.     Tenderness: There is no abdominal tenderness.  Musculoskeletal:        General: No tenderness. Normal range of motion.     Cervical back: Normal range of motion and neck supple.  Skin:    General: Skin is warm and dry.  Neurological:     Mental Status: She is alert and oriented to person, place, and time.     Cranial Nerves: No cranial nerve deficit.     Deep Tendon Reflexes: Reflexes are normal and symmetric.  Psychiatric:        Behavior: Behavior normal.        Thought Content: Thought  content normal.        Judgment: Judgment normal.       BP (!) 192/72   Pulse 63   Temp 98.2 F (36.8 C)   Ht 5\' 4"  (1.626 m)   Wt 141 lb (64 kg)   SpO2 98%   BMI 24.20 kg/m      Assessment & Plan:  Andrea Ross comes in today with chief complaint of Medical Management of Chronic Issues, Hypertension, Chronic Kidney Disease, and Dizziness   Diagnosis and orders addressed:  1. Hyperlipidemia, unspecified hyperlipidemia typ - atorvastatin (LIPITOR) 40 MG tablet; Take 1 tablet (40 mg total) by mouth daily.  Dispense: 90 tablet; Refill: 1  2. Essential hypertension, benign  3. Chronic kidney disease (CKD),  stage IV (severe) ([place])  4. History of cardioembolic cerebrovascular accident (CVA)  5. Overweight (BMI 25.0-29.9)  6. Vitamin D deficiency  7. Osteopenia, unspecified location   Labs pending Health Maintenance reviewed Diet and exercise encouraged  Follow up plan: 6 months, keep follow up with Nephrologists and HTN clinic   Evelina Dun, FNP

## 2022-03-07 NOTE — Patient Instructions (Signed)
Hypertension, Adult High blood pressure (hypertension) is when the force of blood pumping through the arteries is too strong. The arteries are the blood vessels that carry blood from the heart throughout the body. Hypertension forces the heart to work harder to pump blood and may cause arteries to become narrow or stiff. Untreated or uncontrolled hypertension can lead to a heart attack, heart failure, a stroke, kidney disease, and other problems. A blood pressure reading consists of a higher number over a lower number. Ideally, your blood pressure should be below 120/80. The first ("top") number is called the systolic pressure. It is a measure of the pressure in your arteries as your heart beats. The second ("bottom") number is called the diastolic pressure. It is a measure of the pressure in your arteries as the heart relaxes. What are the causes? The exact cause of this condition is not known. There are some conditions that result in high blood pressure. What increases the risk? Certain factors may make you more likely to develop high blood pressure. Some of these risk factors are under your control, including: Smoking. Not getting enough exercise or physical activity. Being overweight. Having too much fat, sugar, calories, or salt (sodium) in your diet. Drinking too much alcohol. Other risk factors include: Having a personal history of heart disease, diabetes, high cholesterol, or kidney disease. Stress. Having a family history of high blood pressure and high cholesterol. Having obstructive sleep apnea. Age. The risk increases with age. What are the signs or symptoms? High blood pressure may not cause symptoms. Very high blood pressure (hypertensive crisis) may cause: Headache. Fast or irregular heartbeats (palpitations). Shortness of breath. Nosebleed. Nausea and vomiting. Vision changes. Severe chest pain, dizziness, and seizures. How is this diagnosed? This condition is diagnosed by  measuring your blood pressure while you are seated, with your arm resting on a flat surface, your legs uncrossed, and your feet flat on the floor. The cuff of the blood pressure monitor will be placed directly against the skin of your upper arm at the level of your heart. Blood pressure should be measured at least twice using the same arm. Certain conditions can cause a difference in blood pressure between your right and left arms. If you have a high blood pressure reading during one visit or you have normal blood pressure with other risk factors, you may be asked to: Return on a different day to have your blood pressure checked again. Monitor your blood pressure at home for 1 week or longer. If you are diagnosed with hypertension, you may have other blood or imaging tests to help your health care provider understand your overall risk for other conditions. How is this treated? This condition is treated by making healthy lifestyle changes, such as eating healthy foods, exercising more, and reducing your alcohol intake. You may be referred for counseling on a healthy diet and physical activity. Your health care provider may prescribe medicine if lifestyle changes are not enough to get your blood pressure under control and if: Your systolic blood pressure is above 130. Your diastolic blood pressure is above 80. Your personal target blood pressure may vary depending on your medical conditions, your age, and other factors. Follow these instructions at home: Eating and drinking  Eat a diet that is high in fiber and potassium, and low in sodium, added sugar, and fat. An example of this eating plan is called the DASH diet. DASH stands for Dietary Approaches to Stop Hypertension. To eat this way: Eat   plenty of fresh fruits and vegetables. Try to fill one half of your plate at each meal with fruits and vegetables. Eat whole grains, such as whole-wheat pasta, brown rice, or whole-grain bread. Fill about one  fourth of your plate with whole grains. Eat or drink low-fat dairy products, such as skim milk or low-fat yogurt. Avoid fatty cuts of meat, processed or cured meats, and poultry with skin. Fill about one fourth of your plate with lean proteins, such as fish, chicken without skin, beans, eggs, or tofu. Avoid pre-made and processed foods. These tend to be higher in sodium, added sugar, and fat. Reduce your daily sodium intake. Many people with hypertension should eat less than 1,500 mg of sodium a day. Do not drink alcohol if: Your health care provider tells you not to drink. You are pregnant, may be pregnant, or are planning to become pregnant. If you drink alcohol: Limit how much you have to: 0-1 drink a day for women. 0-2 drinks a day for men. Know how much alcohol is in your drink. In the [place] one drink equals one 12 oz bottle of beer (355 mL), one 5 oz glass of wine (148 mL), or one 1 oz glass of hard liquor (44 mL). Lifestyle  Work with your health care provider to maintain a healthy body weight or to lose weight. Ask what an ideal weight is for you. Get at least 30 minutes of exercise that causes your heart to beat faster (aerobic exercise) most days of the week. Activities may include walking, swimming, or biking. Include exercise to strengthen your muscles (resistance exercise), such as Pilates or lifting weights, as part of your weekly exercise routine. Try to do these types of exercises for 30 minutes at least 3 days a week. Do not use any products that contain nicotine or tobacco. These products include cigarettes, chewing tobacco, and vaping devices, such as e-cigarettes. If you need help quitting, ask your health care provider. Monitor your blood pressure at home as told by your health care provider. Keep all follow-up visits. This is important. Medicines Take over-the-counter and prescription medicines only as told by your health care provider. Follow directions carefully. Blood  pressure medicines must be taken as prescribed. Do not skip doses of blood pressure medicine. Doing this puts you at risk for problems and can make the medicine less effective. Ask your health care provider about side effects or reactions to medicines that you should watch for. Contact a health care provider if you: Think you are having a reaction to a medicine you are taking. Have headaches that keep coming back (recurring). Feel dizzy. Have swelling in your ankles. Have trouble with your vision. Get help right away if you: Develop a severe headache or confusion. Have unusual weakness or numbness. Feel faint. Have severe pain in your chest or abdomen. Vomit repeatedly. Have trouble breathing. These symptoms may be an emergency. Get help right away. Call 911. Do not wait to see if the symptoms will go away. Do not drive yourself to the hospital. Summary Hypertension is when the force of blood pumping through your arteries is too strong. If this condition is not controlled, it may put you at risk for serious complications. Your personal target blood pressure may vary depending on your medical conditions, your age, and other factors. For most people, a normal blood pressure is less than 120/80. Hypertension is treated with lifestyle changes, medicines, or a combination of both. Lifestyle changes include losing weight, eating a healthy,   low-sodium diet, exercising more, and limiting alcohol. This information is not intended to replace advice given to you by your health care provider. Make sure you discuss any questions you have with your health care provider. Document Revised: 06/26/2021 Document Reviewed: 06/26/2021 Elsevier Patient Education  2023 Elsevier Inc.  

## 2022-04-01 ENCOUNTER — Encounter (HOSPITAL_BASED_OUTPATIENT_CLINIC_OR_DEPARTMENT_OTHER): Payer: Self-pay | Admitting: Cardiovascular Disease

## 2022-04-01 ENCOUNTER — Ambulatory Visit (INDEPENDENT_AMBULATORY_CARE_PROVIDER_SITE_OTHER): Payer: POS | Admitting: Cardiovascular Disease

## 2022-04-01 VITALS — BP 190/82 | HR 66 | Ht 64.0 in | Wt 135.9 lb

## 2022-04-01 DIAGNOSIS — I10 Essential (primary) hypertension: Secondary | ICD-10-CM

## 2022-04-01 DIAGNOSIS — E785 Hyperlipidemia, unspecified: Secondary | ICD-10-CM

## 2022-04-01 DIAGNOSIS — N184 Chronic kidney disease, stage 4 (severe): Secondary | ICD-10-CM

## 2022-04-01 DIAGNOSIS — I771 Stricture of artery: Secondary | ICD-10-CM

## 2022-04-01 DIAGNOSIS — I739 Peripheral vascular disease, unspecified: Secondary | ICD-10-CM | POA: Diagnosis not present

## 2022-04-01 HISTORY — DX: Stricture of artery: I77.1

## 2022-04-01 MED ORDER — DOXAZOSIN MESYLATE 2 MG PO TABS: 2.0000 mg | ORAL_TABLET | Freq: Every day | ORAL | 3 refills | Status: DC

## 2022-04-01 MED ORDER — AMLODIPINE BESYLATE 10 MG PO TABS: 10.0000 mg | ORAL_TABLET | Freq: Every evening | ORAL | 3 refills | Status: DC

## 2022-04-01 NOTE — Assessment & Plan Note (Signed)
She follows with a nephrologist in [place], and they have done a very good job of stabilizing her renal function.  We discussed the importance of avoiding NSAID use and working on blood pressure control as above.

## 2022-04-01 NOTE — Assessment & Plan Note (Signed)
Lipids are poorly controlled.  She has muscle pains with walking.  I am unsure if this is claudication or myalgias from her statin.  For now we will get ABIs and arterial Dopplers.  If this is negative, will need to consider changing her statin.  Her LDL needs to be less than 70.  We will need to be more aggressive with her lipid management once we have the above data.

## 2022-04-01 NOTE — Addendum Note (Signed)
Addended by: Skeet Latch C on: 04/01/2022 11:18 AM   Modules accepted: Level of Service

## 2022-04-01 NOTE — Progress Notes (Signed)
[facility] Initial Assessment:    Date:  04/01/2022   ID:  Andrea Ross, DOB Apr 21, 1946, MRN [id number]  PCP:  Sharion Balloon, FNP  Cardiologist:  Carlyle Dolly, MD  Nephrologist: Dr. Marja Kays  Referring MD: Sharion Balloon, FNP   CC: Hypertension  History of Present Illness:    Andrea Ross is a 76 y.o. female with a hx of hypertension, hyperlipidemia, CVA, CKD IV, here to establish care in the [facility]. When she last saw her PCP earlier this month her BP was 192/72. She has been working with her nephrologist who most recently increased her hydralazine. However, she had symptoms with the higher dose. At that visit she was on amlodipine, carvedilol, chlorthalidone, hydralazine, and spironolactone. In 2016 she had renin and aldosterone levels checked which were normal. She had an Echo 06/2015 which revealed LVEF 60-65% and grade 1 diastolic dysfunction. Carotid Doppler showed 1-39% bilateral ICA stenosis and occluded left vertebral artery, and elevated velocities in the left mid subclavian artery. They recommended 2 year follow-up, and she hasn't had any additional imaging. A CT of the abdomen and pelvis 03/2020 showed extensive abdominal atherosclerosis but no adenomas. It also showed a pulmonary nodule and recommended follow-up.  Today, she reports having hypertension for 15-20 years, more recently difficult to control. At home, her blood pressure was 134/82 yesterday, and 134/84 this morning.  Generally, she takes all of her medicines in two sets, 7-8 AM and 10 AM-12 PM. She takes her nighttime dose around 8-9 PM. At times she notices varying readings between her upper extremities. She denies any numbness. She confirms that her hydralazine was increased recently. However she started to develop blurry vision and dizziness. She is now taking 50 mg of hydralazine twice daily which she seems to be tolerating well. Additionally she complains of  occasional dizziness after standing up suddenly from a seated position. This does not occur if she takes her time while standing. There have been no falls in the past year. However, she does note that she needs to be careful while walking as she may stumble. Remotely she had a mechanical fall due to missing a step. Once in a while she may develop a muscle cramp while stretching. She states she is not an exercise person. She may exercise occasionally. She denies any breathing issues at rest or during exercise/climbing stairs. After a time she suffers pain in her bilateral legs mostly in her thighs, causing her to want to stop moving. As long as she moves slowly, she is usually fine. If she is walking quickly, after about 5 minutes the leg pain occurs. She is able to resume walking after a few minutes of rest. Regarding her diet, she cooks her meals at home. Once in a while she may order out. She has learned to cut back on adding salt. Currently down to 1 cup of coffee a day, from a peak of 10-15 cups of coffee a day (a while ago). Also successfully cut back on sodas. She usually enjoys the occasional hot chocolate or tea. A long time ago she stopped drinking alcohol. She quit smoking over 30 years ago. She states that she wasn't smoking long enough for it to become a difficult habit to overcome. Her sister has told her that she sometimes snores. Usually she feels well rested when waking up. While watching TV, she may take intermittent naps up to 30 minutes. She denies any palpitations, chest pain, shortness of breath,  or peripheral edema. No headaches, syncope, orthopnea, or PND.  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   atorvastatin (LIPITOR) 40 MG tablet Take 1 tablet (40 mg total) by mouth daily.   brimonidine (ALPHAGAN) 0.2 % ophthalmic solution INSTILL 1 DROP INTO BOTH EYES TWICE A DAY   Calcium Carbonate-Vit D-Min (CALCIUM 600+D3 PLUS MINERALS) 600-800 MG-UNIT TABS Take 1 tablet by mouth daily.   carvedilol (COREG) 12.5 MG tablet Take 12.5 mg by mouth 2 (two) times daily.   chlorthalidone (HYGROTON) 25 MG tablet Take 25 mg by mouth daily.   Cholecalciferol 50 MCG (2000 UT) TABS Take 1 tablet by mouth daily.   dorzolamide-timolol (COSOPT) 22.3-6.8 MG/ML ophthalmic solution INSTILL 1 DROP INTO BOTH EYES TWICE A DAY   doxazosin (CARDURA) 2 MG tablet Take 1 tablet (2 mg total) by mouth daily.   hydrALAZINE (APRESOLINE) 50 MG tablet Take 50 mg by mouth 2 (two) times daily.   latanoprost (XALATAN) 0.005 % ophthalmic solution INSTILL 1 DROP INTO BOTH EYES EVERY DAY AT NIGHT   multivitamin-iron-minerals-folic acid (CENTRUM) chewable tablet Chew by mouth.   sodium bicarbonate 650 MG tablet Take 650 mg by mouth 2 (two) times daily.   spironolactone (ALDACTONE) 25 MG tablet Take by mouth.   [DISCONTINUED] amLODipine (NORVASC) 10 MG tablet TAKE 1 TABLET (10 MG TOTAL) BY MOUTH DAILY. (NEEDS TO BE SEEN BEFORE NEXT REFILL)   [DISCONTINUED] hydrALAZINE (APRESOLINE) 100 MG tablet Take 100 mg by mouth 2 (two) times daily.     Allergies:   Pepperoni [pickled meat] and Penicillins   Social History   Socioeconomic History   Marital status: Widowed    Spouse name: Not on file   Number of children: 3    Years of education: Not on file   Highest education level: Not on file  Occupational History   Not on file  Tobacco Use   Smoking status: Former    Packs/day: 0.25    Years: 0.30    Total pack years: 0.08    Types: Cigarettes    Start date: 07/03/1970    Quit date: 09/02/1970    Years since quitting: 51.6   Smokeless tobacco: Never  Vaping Use   Vaping Use: Never used  Substance and Sexual Activity   Alcohol use: No    Alcohol/week: 0.0 standard drinks of alcohol   Drug use: No   Sexual activity: Not on file  Other Topics Concern   Not on file  Social History Narrative   Not on file   Social Determinants of Health   Financial Resource Strain: Low Risk  (04/01/2022)   Overall Financial Resource Strain (CARDIA)    Difficulty of Paying Living Expenses: Not hard  at all  Food Insecurity: No Food Insecurity (04/01/2022)   Hunger Vital Sign    Worried About Running Out of Food in the Last Year: Never true    Ran Out of Food in the Last Year: Never true  Transportation Needs: No Transportation Needs (04/01/2022)   PRAPARE - Hydrologist (Medical): No    Lack of Transportation (Non-Medical): No  Physical Activity: Inactive (04/01/2022)   Exercise Vital Sign    Days of Exercise per Week: 0 days    Minutes of Exercise per Session: 0 min  Stress: Not on file  Social Connections: Not on file     Family History: The patient's family history includes Cancer in her father; Diabetes in her mother; Heart disease in her mother; Heart failure in her mother; Hypertension in her brother, father, and mother; Kidney disease in her brother; Stroke in her brother.  ROS:   Please see the history of present illness.    (+) Dizziness (+) Bilateral leg pain (+) Snoring All other systems reviewed and are negative.  EKGs/Labs/Other Studies Reviewed:    Bilateral Carotid Dopplers 06/20/2015: Notes Recorded by Arnoldo Lenis, MD on 06/21/2015 at 9:33  AM Ultrasound suggests she does have some decreased circulation in the artery going into the right arm, this is the cause of her differences in blood pressure. In the absence of any symptoms this is something that we just monitor at this time.  Echocardiogram  06/20/2015: Study Conclusions   - Left ventricle: The cavity size was normal. Wall thickness was    increased in a pattern of mild LVH. Systolic function was normal.    The estimated ejection fraction was in the range of 60% to 65%.    Wall motion was normal; there were no regional wall motion    abnormalities. Doppler parameters are consistent with abnormal    left ventricular relaxation (grade 1 diastolic dysfunction).  - Aortic valve: Mildly calcified annulus. Trileaflet; mildly    calcified leaflets. There was trivial regurgitation.  - Mitral valve: Calcified annulus. There was trivial regurgitation.  - Right atrium: Central venous pressure (est): 3 mm Hg.  - Atrial septum: No defect or patent foramen ovale was identified.  - Tricuspid valve: There was trivial regurgitation.  - Pulmonary arteries: Systolic pressure could not be accurately    estimated.  - Pericardium, extracardiac: There was no pericardial effusion.   Impressions:   - Mild LVH with LVEF 60-65% and grade 1 diastolic dysfunction. MAC    with trivial mitral regurgitation. Sclerotic aortic valve without    stenosis, trivial aortic regurgitation noted.    EKG:  EKG is personally reviewed. 04/01/2022: Sinus rhythm. Rate 66 bpm. Non-specific T wave abnormalities.  Recent Labs: 11/29/2021: ALT 14; BUN 24; Creatinine, Ser 1.70; Hemoglobin 12.7; Platelets 201; Potassium 4.9; Sodium 142   Recent Lipid Panel    Component Value Date/Time   CHOL 161 05/17/2021 1530   TRIG 69 05/17/2021 1530   HDL 40 05/17/2021 1530   CHOLHDL 4.0 05/17/2021 1530   LDLCALC 108 (H) 05/17/2021 1530    Physical Exam:    VS:  BP (!) 190/82 (BP Location: Left Arm, Patient Position:  Sitting, Cuff Size: Normal)   Pulse 66   Ht _0  (1.626 m)   Wt 135 lb 14.4 oz (61.6 kg)   BMI 23.33 kg/m  , BMI Body mass index is 23.33 kg/m. GENERAL:  Well appearing HEENT: Pupils equal round and reactive, fundi not  visualized, oral mucosa unremarkable NECK:  No jugular venous distention, waveform within normal limits, carotid upstroke brisk and symmetric,+ Lcarotid bruit.  Bounding R carotid pulse, no thyromegaly LUNGS:  Clear to auscultation bilaterally HEART:  RRR.  PMI not displaced or sustained,S1 and S2 within normal limits, no S3, no S4, no clicks, no rubs, no murmurs ABD:  Flat, positive bowel sounds normal in frequency in pitch, no bruits, no rebound, no guarding, no midline pulsatile mass, no hepatomegaly, no splenomegaly EXT:  2+ radial pulses.  Unable to palpate DP/TP bilaterally, no edema, no cyanosis no clubbing SKIN:  No rashes no nodules NEURO:  Cranial nerves II through XII grossly intact, motor grossly intact throughout PSYCH:  Cognitively intact, oriented to person place and time   ASSESSMENT/PLAN:    Resistant hypertension Blood pressure is very poorly controlled.  She had renal and and aldosterone levels checked many years ago and they were normal.  She has known vascular disease.  We will get renal artery Dopplers to assess for renal artery stenosis.  We will also repeat renal and aldosterone levels.  She will start taking her amlodipine in the evenings.  We will add doxazosin 2 mg twice daily.  Could also consider clonidine in the future.  For now, continue carvedilol, chlorthalidone, hydralazine, and spironolactone.  She is going to track her blood pressures twice daily and bring to follow-up in a month.  She struggles with orthostatic symptoms.  We will check orthostatic vital signs today.  She struggles with transportation and is unable to participate in the PREP program.   Hyperlipidemia Lipids are poorly controlled.  She has muscle pains with walking.  I am  unsure if this is claudication or myalgias from her statin.  For now we will get ABIs and arterial Dopplers.  If this is negative, will need to consider changing her statin.  Her LDL needs to be less than 70.  We will need to be more aggressive with her lipid management once we have the above data.  Chronic kidney disease (CKD), stage IV (severe) ([place]) She follows with a nephrologist in [place], and they have done a very good job of stabilizing her renal function.  We discussed the importance of avoiding NSAID use and working on blood pressure control as above.  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  She has differential blood pressures consistent with this but no other symptoms.  Repeating carotid Dopplers and working on lipid control as above.   Screening for Secondary Hypertension:     04/01/2022   10:03 AM  Causes  Drugs/Herbals Screened     - Comments Quit smoking 30 years ago.  Occasional coffee.  Limits sodium intake.  No NSAIDs.  No EtOH  Renovascular HTN Screened     - Comments Check renal artery Dopplers  Sleep Apnea Screened     - Comments snores but no other symptoms  Thyroid Disease Screened  Hyperaldosteronism Screened     - Comments check renin and aldosterone  Pheochromocytoma Screened  Cushing's Syndrome Screened  Hyperparathyroidism Screened  Coarctation of the Aorta Screened     - Comments BP asymmetric 2/2 subclavian stenosis  Compliance Screened    Relevant Labs/Studies:    Latest Ref Rng & Units 11/29/2021   12:26 PM 05/17/2021    3:30 PM 12/22/2020   11:04 AM  Basic Labs  Sodium 134 - 144 mmol/L 142  139  137  Potassium 3.5 - 5.2 mmol/L 4.9  4.5  4.5   Creatinine 0.57 - 1.00 mg/dL 1.70  1.84  1.92        Latest Ref Rng & Units 07/24/2015    8:19 AM  Thyroid   TSH 0.450 - 4.500 uIU/mL 2.310        Latest Ref Rng & Units 07/24/2015    8:19 AM  Renin/Aldosterone    Aldosterone 0.0 - 30.0 ng/dL 12.4   Renin ng/mL/hr 10.72   Aldos/Renin Ratio 0.0 - 30.0 1.2              04/01/2022   11:07 AM  Renovascular   Renal Artery [place] Completed Yes      Disposition:    FU with APP/PharmD in 1 month for the next 3 months.   FU with Ronnald Shedden C. Oval Linsey, MD, [facility] in 4 months.  Medication Adjustments/Labs and Tests Ordered: Current medicines are reviewed at length with the patient today.  Concerns regarding medicines are outlined above.   Orders Placed This Encounter  Procedures   TSH   Aldosterone + renin activity w/ ratio   EKG 12-Lead   VAS US CAROTID   VAS [place] ABI WITH/WO TBI   VAS [place] LOWER EXTREMITY ARTERIAL DUPLEX   VAS US RENAL ARTERY DUPLEX   Meds ordered this encounter  Medications   doxazosin (CARDURA) 2 MG tablet    Sig: Take 1 tablet (2 mg total) by mouth daily.    Dispense:  90 tablet    Refill:  3   amLODipine (NORVASC) 10 MG tablet    Sig: Take 1 tablet (10 mg total) by mouth every evening.    Dispense:  90 tablet    Refill:  3   I,Mathew Stumpf,acting as a scribe for Skeet Latch, MD.,have documented all relevant documentation on the behalf of Skeet Latch, MD,as directed by  Skeet Latch, MD while in the presence of Skeet Latch, MD.  I, [place] Oval Linsey, MD have reviewed all documentation for this visit.  The documentation of the exam, diagnosis, procedures, and orders on 04/01/2022 are all accurate and complete.   Signed, Skeet Latch, MD  04/01/2022 11:18 AM    [place]

## 2022-04-01 NOTE — Assessment & Plan Note (Addendum)
Blood pressure is very poorly controlled.  She had renal and and aldosterone levels checked many years ago and they were normal.  She has known vascular disease.  We will get renal artery Dopplers to assess for renal artery stenosis.  We will also repeat renal and aldosterone levels.  She will start taking her amlodipine in the evenings.  We will add doxazosin 2 mg twice daily.  Could also consider clonidine in the future.  For now, continue carvedilol, chlorthalidone, hydralazine, and spironolactone.  She is going to track her blood pressures twice daily and bring to follow-up in a month.  She struggles with orthostatic symptoms.  We will check orthostatic vital signs today.  She struggles with transportation and is unable to participate in the PREP program.

## 2022-04-01 NOTE — Patient Instructions (Signed)
Medication Instructions:  START DOXAZOSIN 2 MG DAILY   START TAKING YOUR AMLODIPINE IN THE EVENING    Labwork: TSH/RENIN/ALDOSTERONE TODAY    Testing/Procedures: Your physician has requested that you have a renal artery duplex. During this test, an ultrasound is used to evaluate blood flow to the kidneys. Allow one hour for this exam. Do not eat after midnight the day before and avoid carbonated beverages. Take your medications as you usually do.  Your physician has requested that you have a carotid duplex. This test is an ultrasound of the carotid arteries in your neck. It looks at blood flow through these arteries that supply the brain with blood. Allow one hour for this exam. There are no restrictions or special instructions.  Your physician has requested that you have an ankle brachial index (ABI). During this test an ultrasound and blood pressure cuff are used to evaluate the arteries that supply the arms and legs with blood. Allow thirty minutes for this exam. There are no restrictions or special instructions.  Your physician has requested that you have a lower extremity arterial duplex. This test is an ultrasound of the arteries in the legs or arms. It looks at arterial blood flow in the legs and arms. Allow one hour for Lower and Upper Arterial scans. There are no restrictions or special instructions  Follow-Up: 05/01/2022 10:30 am with Pharm D at [facility] OFFICE    Special Instructions:   MONITOR YOUR BLOOD PRESSURE TWICE A DAY, LOG IN THE BOOK PROVIDED. BRING THE BOOK AND YOUR BLOOD PRESSURE MACHINE TO YOUR FOLLOW UP IN 1 MONTH   DASH Eating Plan DASH stands for "Dietary Approaches to Stop Hypertension." The DASH eating plan is a healthy eating plan that has been shown to reduce high blood pressure (hypertension). It may also reduce your risk for type 2 diabetes, heart disease, and stroke. The DASH eating plan may also help with weight loss. What are tips for following this  plan?  General guidelines Avoid eating more than 2,300 mg (milligrams) of salt (sodium) a day. If you have hypertension, you may need to reduce your sodium intake to 1,500 mg a day. Limit alcohol intake to no more than 1 drink a day for nonpregnant women and 2 drinks a day for men. One drink equals 12 oz of beer, 5 oz of wine, or 1 oz of hard liquor. Work with your health care provider to maintain a healthy body weight or to lose weight. Ask what an ideal weight is for you. Get at least 30 minutes of exercise that causes your heart to beat faster (aerobic exercise) most days of the week. Activities may include walking, swimming, or biking. Work with your health care provider or diet and nutrition specialist (dietitian) to adjust your eating plan to your individual calorie needs. Reading food labels  Check food labels for the amount of sodium per serving. Choose foods with less than 5 percent of the Daily Value of sodium. Generally, foods with less than 300 mg of sodium per serving fit into this eating plan. To find whole grains, look for the word "whole" as the first word in the ingredient list. Shopping Buy products labeled as "low-sodium" or "no salt added." Buy fresh foods. Avoid canned foods and premade or frozen meals. Cooking Avoid adding salt when cooking. Use salt-free seasonings or herbs instead of table salt or sea salt. Check with your health care provider or pharmacist before using salt substitutes. Do not fry foods. Cook foods using  healthy methods such as baking, boiling, grilling, and broiling instead. Cook with heart-healthy oils, such as olive, canola, soybean, or sunflower oil. Meal planning Eat a balanced diet that includes: 5 or more servings of fruits and vegetables each day. At each meal, try to fill half of your plate with fruits and vegetables. Up to 6-8 servings of whole grains each day. Less than 6 oz of lean meat, poultry, or fish each day. A 3-oz serving of meat is  about the same size as a deck of cards. One egg equals 1 oz. 2 servings of low-fat dairy each day. A serving of nuts, seeds, or beans 5 times each week. Heart-healthy fats. Healthy fats called Omega-3 fatty acids are found in foods such as flaxseeds and coldwater fish, like sardines, salmon, and mackerel. Limit how much you eat of the following: Canned or prepackaged foods. Food that is high in trans fat, such as fried foods. Food that is high in saturated fat, such as fatty meat. Sweets, desserts, sugary drinks, and other foods with added sugar. Full-fat dairy products. Do not salt foods before eating. Try to eat at least 2 vegetarian meals each week. Eat more home-cooked food and less restaurant, buffet, and fast food. When eating at a restaurant, ask that your food be prepared with less salt or no salt, if possible. What foods are recommended? The items listed may not be a complete list. Talk with your dietitian about what dietary choices are best for you. Grains Whole-grain or whole-wheat bread. Whole-grain or whole-wheat pasta. Brown rice. [place]. Bulgur. Whole-grain and low-sodium cereals. Pita bread. Low-fat, low-sodium crackers. Whole-wheat flour tortillas. Vegetables Fresh or frozen vegetables (raw, steamed, roasted, or grilled). Low-sodium or reduced-sodium tomato and vegetable juice. Low-sodium or reduced-sodium tomato sauce and tomato paste. Low-sodium or reduced-sodium canned vegetables. Fruits All fresh, dried, or frozen fruit. Canned fruit in natural juice (without added sugar). Meat and other protein foods Skinless chicken or [place]. Ground chicken or [place]. Pork with fat trimmed off. Fish and seafood. Egg whites. Dried beans, peas, or lentils. Unsalted nuts, nut butters, and seeds. Unsalted canned beans. Lean cuts of beef with fat trimmed off. Low-sodium, lean deli meat. Dairy Low-fat (1%) or fat-free (skim) milk. Fat-free, low-fat, or reduced-fat cheeses. Nonfat,  low-sodium ricotta or cottage cheese. Low-fat or nonfat yogurt. Low-fat, low-sodium cheese. Fats and oils Soft margarine without trans fats. Vegetable oil. Low-fat, reduced-fat, or light mayonnaise and salad dressings (reduced-sodium). Canola, safflower, olive, soybean, and sunflower oils. Avocado. Seasoning and other foods Herbs. Spices. Seasoning mixes without salt. Unsalted popcorn and pretzels. Fat-free sweets. What foods are not recommended? The items listed may not be a complete list. Talk with your dietitian about what dietary choices are best for you. Grains Baked goods made with fat, such as croissants, muffins, or some breads. Dry pasta or rice meal packs. Vegetables Creamed or fried vegetables. Vegetables in a cheese sauce. Regular canned vegetables (not low-sodium or reduced-sodium). Regular canned tomato sauce and paste (not low-sodium or reduced-sodium). Regular tomato and vegetable juice (not low-sodium or reduced-sodium). Angie Fava. Olives. Fruits Canned fruit in a light or heavy syrup. Fried fruit. Fruit in cream or butter sauce. Meat and other protein foods Fatty cuts of meat. Ribs. Fried meat. Berniece Salines. Sausage. Bologna and other processed lunch meats. Salami. Fatback. Hotdogs. Bratwurst. Salted nuts and seeds. Canned beans with added salt. Canned or smoked fish. Whole eggs or egg yolks. Chicken or [place] with skin. Dairy Whole or 2% milk, cream, and half-and-half. Whole  or full-fat cream cheese. Whole-fat or sweetened yogurt. Full-fat cheese. Nondairy creamers. Whipped toppings. Processed cheese and cheese spreads. Fats and oils Butter. Stick margarine. Lard. Shortening. Ghee. Bacon fat. Tropical oils, such as coconut, palm kernel, or palm oil. Seasoning and other foods Salted popcorn and pretzels. Onion salt, garlic salt, seasoned salt, table salt, and sea salt. Worcestershire sauce. Tartar sauce. Barbecue sauce. Teriyaki sauce. Soy sauce, including reduced-sodium. Steak sauce.  Canned and packaged gravies. Fish sauce. Oyster sauce. Cocktail sauce. Horseradish that you find on the shelf. Ketchup. Mustard. Meat flavorings and tenderizers. Bouillon cubes. Hot sauce and Tabasco sauce. Premade or packaged marinades. Premade or packaged taco seasonings. Relishes. Regular salad dressings. Where to find more information: National Heart, Lung, and [place]: https://wilson-eaton.com/ American Heart Association: www.heart.org Summary The DASH eating plan is a healthy eating plan that has been shown to reduce high blood pressure (hypertension). It may also reduce your risk for type 2 diabetes, heart disease, and stroke. With the DASH eating plan, you should limit salt (sodium) intake to 2,300 mg a day. If you have hypertension, you may need to reduce your sodium intake to 1,500 mg a day. When on the DASH eating plan, aim to eat more fresh fruits and vegetables, whole grains, lean proteins, low-fat dairy, and heart-healthy fats. Work with your health care provider or diet and nutrition specialist (dietitian) to adjust your eating plan to your individual calorie needs. This information is not intended to replace advice given to you by your health care provider. Make sure you discuss any questions you have with your health care provider. Document Released: 08/08/2011 Document Revised: 08/01/2017 Document Reviewed: 08/12/2016 Elsevier Patient Education  2020 [employer].

## 2022-04-01 NOTE — Assessment & Plan Note (Signed)
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  She has differential blood pressures consistent with this but no other symptoms.  Repeating carotid Dopplers and working on lipid control as above.

## 2022-04-02 NOTE — Addendum Note (Signed)
Addended by: Alvina Filbert B on: 04/02/2022 03:39 PM   Modules accepted: Orders

## 2022-04-04 LAB — ALDOSTERONE + RENIN ACTIVITY W/ RATIO
ALDOS/RENIN RATIO: 0.9 (ref 0.0–30.0)
ALDOSTERONE: 8.9 ng/dL (ref 0.0–30.0)
Renin: 9.982 ng/mL/hr — ABNORMAL HIGH (ref 0.167–5.380)

## 2022-04-04 LAB — TSH: TSH: 1.87 u[IU]/mL (ref 0.450–4.500)

## 2022-04-24 ENCOUNTER — Other Ambulatory Visit: Payer: POS

## 2022-05-01 ENCOUNTER — Ambulatory Visit: Payer: POS | Attending: Internal Medicine | Admitting: Pharmacist Clinician (PhC)/ Clinical Pharmacy Specialist

## 2022-05-01 DIAGNOSIS — I10 Essential (primary) hypertension: Secondary | ICD-10-CM | POA: Diagnosis not present

## 2022-05-01 DIAGNOSIS — I1A0 Resistant hypertension: Secondary | ICD-10-CM

## 2022-05-01 NOTE — Progress Notes (Unsigned)
05/13/2022 Andrea Ross 12/07/1945 [id number]   HPI:  Andrea Ross is a 76 y.o. female patient of Dr Oval Linsey, with a PMH below who presents today for advanced hypertension clinic follow up.  She was referred to the [facility] by Dr. Carlyle Dolly, and when seen by Dr. Oval Linsey last month, had a BP of 190/82.  Dr. Oval Linsey added doxazosin 2 mg bid, as we are somewhat limited by her CKD diagnosis.  Today she is in the office for follow up.  Patient tells me that she has an appointment tomorrow with her nephrologist.  They have also been making some changes to her medications to get BP controlled.    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 in [place]     Blood Pressure Goal:  130/80  Current Medications:   amlodipine 10 mg qd- pm, carvedilol 12.5 mg bid, chlorthalidone 25 mg qd - am, doxazosin 2 mg bid, hydralazine 50 mg bid, spironolactone 25 mg qd - am  Social Hx: no tobacco, no alcohol, coffee and soda regularly, doen to 1-2 cups coffee at most (decaf); Mt Dew  (1-2 per week) or occasional Coke  Diet:    mostly home cooked meals, vegetables canned, but does rinse, uses very little salt at home, no heat and eat; not regularly snacking (does get lightly salted chips when does)  Exercise: none  Home BP readings:   [facility] home cuff - arm  77-5 years old; systolic reads within 10 points, diastolic off by 15.    AM 28 readings average 172/80 HR 61  (range 123-197/66-101)  PM 29 readings average 153/81 HR 61  (range 115-175/64-109)  Intolerances:  no cardiac medication intolerances  Labs: 3/23 Na 142, K 4.9, Glu 89, BUN 24, SCr 1.70, GFR 31   Wt Readings from Last 3 Encounters:  04/01/22 135 lb 14.4 oz (61.6 kg)  03/07/22 141 lb (64 kg)  11/29/21 145 lb 6.4 oz (66 kg)   BP Readings from Last 3 Encounters:  05/01/22 (!) 171/89  04/01/22 (!) 190/82  03/07/22  (!) 147/81   Pulse Readings from Last 3 Encounters:  05/01/22 67  04/01/22 66  03/07/22 (!) 59    Current Outpatient Medications  Medication Sig Dispense Refill   amLODipine (NORVASC) 10 MG tablet Take 1 tablet (10 mg total) by mouth every evening. 90 tablet 3   aspirin 81 MG chewable tablet Chew 81 mg by mouth daily.     atorvastatin (LIPITOR) 40 MG tablet Take 1 tablet (40 mg total) by mouth daily. 90 tablet 1   brimonidine (ALPHAGAN) 0.2 % ophthalmic solution INSTILL 1 DROP INTO BOTH EYES TWICE A DAY  3   Calcium Carbonate-Vit D-Min (CALCIUM 600+D3 PLUS MINERALS) 600-800 MG-UNIT TABS Take 1 tablet by mouth daily.     carvedilol (COREG) 12.5 MG tablet Take 12.5 mg by mouth 2 (two) times daily.     chlorthalidone (HYGROTON) 25 MG tablet Take 25 mg by mouth daily.     Cholecalciferol 50 MCG (2000 UT) TABS Take 1 tablet by mouth daily.     dorzolamide-timolol (COSOPT) 22.3-6.8 MG/ML ophthalmic solution INSTILL 1 DROP INTO BOTH EYES TWICE A DAY  12   doxazosin (CARDURA) 2 MG tablet Take 1 tablet (2 mg total) by mouth daily. 90 tablet 3   hydrALAZINE (APRESOLINE) 50 MG tablet Take 50 mg  by mouth 2 (two) times daily.     latanoprost (XALATAN) 0.005 % ophthalmic solution INSTILL 1 DROP INTO BOTH EYES EVERY DAY AT NIGHT  12   multivitamin-iron-minerals-folic acid (CENTRUM) chewable tablet Chew by mouth.     sodium bicarbonate 650 MG tablet Take 650 mg by mouth 2 (two) times daily.     spironolactone (ALDACTONE) 25 MG tablet Take by mouth.     No current facility-administered medications for this visit.    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  Resistant  hypertension Patient with resistant hypertension and CKD, with BP readings.  Would really like to have her on an ARB for renal protection as well as BP control.  Since she is seeing nephrology tomorrow will ask for their preference in ARB use.  She is scheduled to have renal doppler next week to look for possible RAS.   I will see her in one month for follow up.   Addendum:  Nephrology started her on losartan 12.5 mg daily and switched amlodipine 10 mg to nifedipine xl 60 mg daily.     Tommy Medal PharmD CPP [place] [address] [place], [place] [address] [phone]

## 2022-05-01 NOTE — Patient Instructions (Signed)
Return for a a follow up appointment October 24 at 10:30 am  Check your blood pressure at home daily and keep record of the readings.  Take your BP meds as follows:  Let's talk to Dr. Theador Hawthorne and see if we can start olmesartan or valsartan to help with your blood pressure.    Continue your current medications for now.   Bring all of your meds, your BP cuff and your record of home blood pressures to your next appointment.  Exercise as you're able, try to walk approximately 30 minutes per day.  Keep salt intake to a minimum, especially watch canned and prepared boxed foods.  Eat more fresh fruits and vegetables and fewer canned items.  Avoid eating in fast food restaurants.    HOW TO TAKE YOUR BLOOD PRESSURE: Rest 5 minutes before taking your blood pressure.  Don't smoke or drink caffeinated beverages for at least 30 minutes before. Take your blood pressure before (not after) you eat. Sit comfortably with your back supported and both feet on the floor (don't cross your legs). Elevate your arm to heart level on a table or a desk. Use the proper sized cuff. It should fit smoothly and snugly around your bare upper arm. There should be enough room to slip a fingertip under the cuff. The bottom edge of the cuff should be 1 inch above the crease of the elbow. Ideally, take 3 measurements at one sitting and record the average.  Tues

## 2022-05-10 ENCOUNTER — Other Ambulatory Visit (HOSPITAL_BASED_OUTPATIENT_CLINIC_OR_DEPARTMENT_OTHER): Payer: Self-pay | Admitting: Cardiovascular Disease

## 2022-05-10 ENCOUNTER — Ambulatory Visit (INDEPENDENT_AMBULATORY_CARE_PROVIDER_SITE_OTHER): Payer: POS

## 2022-05-10 DIAGNOSIS — I739 Peripheral vascular disease, unspecified: Secondary | ICD-10-CM | POA: Diagnosis not present

## 2022-05-10 DIAGNOSIS — I10 Essential (primary) hypertension: Secondary | ICD-10-CM | POA: Diagnosis not present

## 2022-05-10 DIAGNOSIS — I771 Stricture of artery: Secondary | ICD-10-CM

## 2022-05-13 ENCOUNTER — Telehealth: Payer: Self-pay | Admitting: Cardiovascular Disease

## 2022-05-13 ENCOUNTER — Encounter: Payer: Self-pay | Admitting: Pharmacist Clinician (PhC)/ Clinical Pharmacy Specialist

## 2022-05-13 NOTE — Telephone Encounter (Signed)
Pt is requesting call back in regards to results.  

## 2022-05-13 NOTE — Assessment & Plan Note (Signed)
Patient with resistant hypertension and CKD, with BP readings.  Would really like to have her on an ARB for renal protection as well as BP control.  Since she is seeing nephrology tomorrow will ask for their preference in ARB use.  She is scheduled to have renal doppler next week to look for possible RAS.   I will see her in one month for follow up.   Addendum:  Nephrology started her on losartan 12.5 mg daily and switched amlodipine 10 mg to nifedipine xl 60 mg daily.

## 2022-05-13 NOTE — Telephone Encounter (Signed)
Returned call to patient to let her know that results are not yet finalized but we will call her when they are ready.

## 2022-05-20 ENCOUNTER — Telehealth (HOSPITAL_BASED_OUTPATIENT_CLINIC_OR_DEPARTMENT_OTHER): Payer: Self-pay | Admitting: Cardiovascular Disease

## 2022-05-20 DIAGNOSIS — I701 Atherosclerosis of renal artery: Secondary | ICD-10-CM

## 2022-05-20 DIAGNOSIS — I771 Stricture of artery: Secondary | ICD-10-CM

## 2022-05-20 NOTE — Telephone Encounter (Signed)
Pt c/o medication issue:  1. Name of Medication: Nifedipine 60 mg  2. How are you currently taking this medication (dosage and times per day)? 1 tablet two times a day   3. Are you having a reaction (difficulty breathing--STAT)? Yes   4. What is your medication issue? Patient is calling stating her kidney doctor prescribed this medication and it causes her to have lightheadedness/dizziness, lack of focus, and blurred vision. She reports this occurs for about an hour even when only 1/2 a tablet if taken. She reports her BP is still running 180-215, but had not checked BP at the time of call. She is wanting to know if this could be due to a reaction from one of her other current medications. Please advise.

## 2022-05-20 NOTE — Telephone Encounter (Signed)
Any chance this is an interaction with her other meds?

## 2022-05-20 NOTE — Telephone Encounter (Signed)
Patient following up on results

## 2022-05-20 NOTE — Telephone Encounter (Signed)
Follow Up:    Patient is calling  to see if her test results are ready from 05-10-22 please?

## 2022-05-21 NOTE — Telephone Encounter (Signed)
Urgent referral placed

## 2022-05-21 NOTE — Telephone Encounter (Signed)
-----   Message from Skeet Latch, MD sent at 05/21/2022  1:29 PM EDT ----- Discussed with patient.  Please refer to vascular surgery asap

## 2022-05-21 NOTE — Telephone Encounter (Signed)
Reviewed information with patient.  She has been having dizziness since adding in losartan 12.5 mg and nifedipine xl 60 mg.  She has not been cutting nifedipine in half.      Advised her to reach out to renal MD, as they were the ones to prescribe these medications.  Did suggest she hold nifedipine dose today, as she thinks they are out of office until tomorrow.

## 2022-05-22 ENCOUNTER — Encounter: Payer: Self-pay | Admitting: Vascular Surgery

## 2022-05-22 ENCOUNTER — Ambulatory Visit (INDEPENDENT_AMBULATORY_CARE_PROVIDER_SITE_OTHER): Payer: POS | Admitting: Vascular Surgery

## 2022-05-22 VITALS — BP 186/134 | HR 70 | Temp 98.3°F | Resp 20 | Ht 64.0 in | Wt 135.0 lb

## 2022-05-22 DIAGNOSIS — I701 Atherosclerosis of renal artery: Secondary | ICD-10-CM

## 2022-05-22 NOTE — H&P (View-Only) (Signed)
Patient ID: Andrea Ross, female   DOB: 09/23/1945, 76 y.o.   MRN: [id number]  Reason for Consult: New Patient (Initial Visit)   Referred by Skeet Latch, MD  Subjective:     HPI:  Andrea Ross is a 76 y.o. female has a history of severe medication resistant hypertension.  She does not remember any previous interventions.  She does have risk factors of hyperlipidemia and hypertension.  She eats without limitation although she does have a small appetite she does not have any food fear or pain and her bowels do work with the help of laxative.  She walks without limitation other than if she attempts to walk fast she occasionally does have claudication.  She denies any tissue loss or ulceration.  No previous history of stroke, TIA or amaurosis.  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   HIATAL HERNIA REPAIR  9326   SLT LASER APPLICATION Right 03/14/4579   Procedure: SLT LASER APPLICATION;  Surgeon: Williams Che, MD;  Location: [facility];  Service: Ophthalmology;  Laterality: Right;   UMBILICAL HERNIA REPAIR  1980    Short Social History:  Social History   Tobacco Use   Smoking status: Former    Packs/day: 0.25    Years: 0.30    Total pack years: 0.08    Types: Cigarettes    Start date: 07/03/1970    Quit date: 09/02/1970    Years since quitting: 51.7   Smokeless tobacco: Never  Substance Use Topics    Alcohol use: No    Alcohol/week: 0.0 standard drinks of alcohol    Allergies  Allergen Reactions   Pepperoni [Pickled Meat] Swelling   Penicillins Rash    Current Outpatient Medications  Medication Sig Dispense Refill   amLODipine (NORVASC) 10 MG tablet Take 1 tablet (10 mg total) by mouth every evening. 90 tablet 3   aspirin 81 MG chewable tablet Chew 81 mg by mouth daily.     atorvastatin (LIPITOR) 40 MG tablet Take 1 tablet (40 mg total) by mouth daily. 90 tablet 1   brimonidine (ALPHAGAN) 0.2 % ophthalmic solution INSTILL 1 DROP INTO BOTH EYES TWICE A DAY  3   Calcium Carbonate-Vit D-Min (CALCIUM 600+D3 PLUS MINERALS) 600-800 MG-UNIT TABS Take 1 tablet by mouth daily.     carvedilol (COREG) 12.5 MG tablet Take 12.5 mg by mouth 2 (two) times daily.     chlorthalidone (HYGROTON) 25 MG tablet Take 25 mg by mouth daily.     Cholecalciferol 50 MCG (2000 UT) TABS Take 1 tablet by mouth daily.     cloNIDine (CATAPRES) 0.1 MG tablet Take 0.1 mg by mouth 2 (two) times daily.     dorzolamide-timolol (COSOPT) 22.3-6.8 MG/ML ophthalmic solution INSTILL 1 DROP INTO BOTH EYES TWICE  A DAY  12   doxazosin (CARDURA) 2 MG tablet Take 1 tablet (2 mg total) by mouth daily. 90 tablet 3   hydrALAZINE (APRESOLINE) 50 MG tablet Take 50 mg by mouth 2 (two) times daily.     latanoprost (XALATAN) 0.005 % ophthalmic solution INSTILL 1 DROP INTO BOTH EYES EVERY DAY AT NIGHT  12   losartan (COZAAR) 25 MG tablet Take 12.5 mg by mouth daily.     multivitamin-iron-minerals-folic acid (CENTRUM) chewable tablet Chew by mouth.     NIFEdipine (PROCARDIA XL/NIFEDICAL XL) 60 MG 24 hr tablet Take 60 mg by mouth daily.     sodium bicarbonate 650 MG tablet Take 650 mg by mouth 2 (two) times daily.     spironolactone (ALDACTONE) 25 MG tablet Take by mouth.     No current facility-administered medications for this visit.    Review of Systems  Constitutional:  Constitutional negative. HENT: HENT negative.  Eyes: Eyes  negative.  Cardiovascular: Positive for claudication.  GI: Gastrointestinal negative.  Musculoskeletal: Musculoskeletal negative.  Skin: Skin negative.  Neurological: Neurological negative. Hematologic: Hematologic/lymphatic negative.  Psychiatric: Psychiatric negative.        Objective:  Objective   Vitals:   05/22/22 1057 05/22/22 1059  BP: (!) 247/95 (!) 186/134  Pulse: 70   Resp: 20   Temp: 98.3 F (36.8 C)   SpO2: 97%   Weight: 135 lb (61.2 kg)   Height: 5\' 4"  (1.626 m)    Body mass index is 23.17 kg/m.  Physical Exam HENT:     Head: Normocephalic.     Nose: Nose normal.     Mouth/Throat:     Mouth: Mucous membranes are moist.  Eyes:     Pupils: Pupils are equal, round, and reactive to light.  Neck:     Vascular: No carotid bruit.  Cardiovascular:     Rate and Rhythm: Normal rate.  Pulmonary:     Effort: Pulmonary effort is normal.  Abdominal:     General: Abdomen is flat.     Palpations: Abdomen is soft. There is no mass.     Comments: Positive bruit  Skin:    General: Skin is warm and dry.     Capillary Refill: Capillary refill takes less than 2 seconds.  Neurological:     Mental Status: She is alert.  Psychiatric:        Mood and Affect: Mood normal.     Data: Right Renal ArteryPSV cm/sEDV cm/sComment  +------------------+--------+--------+-------+  Origin              771     238            +------------------+--------+--------+-------+  Proximal            275      43            +------------------+--------+--------+-------+  Mid                  86      24            +------------------+--------+--------+-------+  Distal               42      14            +------------------+--------+--------+-------+   +-----------------+--------+--------+-------+  Left Renal ArteryPSV cm/sEDV cm/sComment  +-----------------+--------+--------+-------+  Origin             929     292             +-----------------+--------+--------+-------+  Proximal           229      24            +-----------------+--------+--------+-------+  Mid                172      44            +-----------------+--------+--------+-------+  Distal              81      26            +-----------------+--------+--------+-------+      Technologist observations: Bilateral renal pelvis filling noted, no  evidence of hydronephrosis.   +------------+--------+--------+----+-----------+--------+--------+----+  Right KidneyPSV cm/sEDV cm/sRI  Left KidneyPSV cm/sEDV cm/sRI    +------------+--------+--------+----+-----------+--------+--------+----+  Upper Pole  12      4       0.69Upper Pole 14      4       0.69  +------------+--------+--------+----+-----------+--------+--------+----+  Mid         16      5       0.66Mid        16      5       0.66  +------------+--------+--------+----+-----------+--------+--------+----+  Lower Pole  10      3       0.65Lower Pole 15      5       0.66  +------------+--------+--------+----+-----------+--------+--------+----+  Hilar       13      4       0.69Hilar      27      10      0.63  +------------+--------+--------+----+-----------+--------+--------+----+   +------------------+-------+------------------+--------+  Right Kidney             Left Kidney                 +------------------+-------+------------------+--------+  RAR                      RAR                         +------------------+-------+------------------+--------+  RAR (manual)      5.7    RAR (manual)      6.8       +------------------+-------+------------------+--------+  Cortex                   Cortex                      +------------------+-------+------------------+--------+  Cortex thickness  9.00 mmCorex thickness   10.00 mm  +------------------+-------+------------------+--------+  Kidney length (cm)8.4     Kidney length (cm)8.5       +------------------+-------+------------------+--------+      Summary:  Largest Aortic Diameter: 2.0 cm     Renal:     Right: Abnormal size for the right kidney. Normal right Resisitive         Index. Normal cortical thickness of right kidney. Evidence of         a > 60% stenosis of the right renal artery. RRV flow present.  Left:  Abnormal size for the left kidney. Normal left Resistive         Index. Normal cortical thickness of the left kidney. Evidence         of a > 60% stenosis in the left renal artery. LRV flow  present.  Mesenteric:  Normal Superior Mesenteric artery findings. 70 to 99% stenosis in the  celiac  artery.     No evidence of right iliac stenosis seen, moderate atherosclerosis noted  throughout.  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      Assessment/Plan:    76 year old female with bilateral high-grade renal artery stenosis with normal resistive indices although her kidneys are somewhat on the small side.  I discussed possibly obtaining a CT scan although she does have a previous CT scan from 2021 which did not demonstrate any extreme calcification and also the CAT scan will need to be done without contrast given her elevated creatinine.  Rather we will proceed with angiography from the right common femoral approach to evaluate stenting of the bilateral renal arteries given that the resistive indices were low even though the kidneys are quite small.  Depending on her renal function and the amount of contrast used at that time we can consider limited evaluation of her bilateral lower extremities at that time as well.  We discussed the risk benefits alternatives she demonstrates good understanding we will get her scheduled on Monday in the near future.     Waynetta Sandy MD [facility]

## 2022-05-22 NOTE — Progress Notes (Signed)
Patient ID: Andrea Ross, female   DOB: 26-Dec-1945, 76 y.o.   MRN: [id number]  Reason for Consult: New Patient (Initial Visit)   Referred by Skeet Latch, MD  Subjective:     HPI:  Andrea Ross is a 76 y.o. female has a history of severe medication resistant hypertension.  She does not remember any previous interventions.  She does have risk factors of hyperlipidemia and hypertension.  She eats without limitation although she does have a small appetite she does not have any food fear or pain and her bowels do work with the help of laxative.  She walks without limitation other than if she attempts to walk fast she occasionally does have claudication.  She denies any tissue loss or ulceration.  No previous history of stroke, TIA or amaurosis.  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   HIATAL HERNIA REPAIR  7035   SLT LASER APPLICATION Right 0/05/3817   Procedure: SLT LASER APPLICATION;  Surgeon: Williams Che, MD;  Location: [facility];  Service: Ophthalmology;  Laterality: Right;   UMBILICAL HERNIA REPAIR  1980    Short Social History:  Social History   Tobacco Use   Smoking status: Former    Packs/day: 0.25    Years: 0.30    Total pack years: 0.08    Types: Cigarettes    Start date: 07/03/1970    Quit date: 09/02/1970    Years since quitting: 51.7   Smokeless tobacco: Never  Substance Use Topics    Alcohol use: No    Alcohol/week: 0.0 standard drinks of alcohol    Allergies  Allergen Reactions   Pepperoni [Pickled Meat] Swelling   Penicillins Rash    Current Outpatient Medications  Medication Sig Dispense Refill   amLODipine (NORVASC) 10 MG tablet Take 1 tablet (10 mg total) by mouth every evening. 90 tablet 3   aspirin 81 MG chewable tablet Chew 81 mg by mouth daily.     atorvastatin (LIPITOR) 40 MG tablet Take 1 tablet (40 mg total) by mouth daily. 90 tablet 1   brimonidine (ALPHAGAN) 0.2 % ophthalmic solution INSTILL 1 DROP INTO BOTH EYES TWICE A DAY  3   Calcium Carbonate-Vit D-Min (CALCIUM 600+D3 PLUS MINERALS) 600-800 MG-UNIT TABS Take 1 tablet by mouth daily.     carvedilol (COREG) 12.5 MG tablet Take 12.5 mg by mouth 2 (two) times daily.     chlorthalidone (HYGROTON) 25 MG tablet Take 25 mg by mouth daily.     Cholecalciferol 50 MCG (2000 UT) TABS Take 1 tablet by mouth daily.     cloNIDine (CATAPRES) 0.1 MG tablet Take 0.1 mg by mouth 2 (two) times daily.     dorzolamide-timolol (COSOPT) 22.3-6.8 MG/ML ophthalmic solution INSTILL 1 DROP INTO BOTH EYES TWICE  A DAY  12   doxazosin (CARDURA) 2 MG tablet Take 1 tablet (2 mg total) by mouth daily. 90 tablet 3   hydrALAZINE (APRESOLINE) 50 MG tablet Take 50 mg by mouth 2 (two) times daily.     latanoprost (XALATAN) 0.005 % ophthalmic solution INSTILL 1 DROP INTO BOTH EYES EVERY DAY AT NIGHT  12   losartan (COZAAR) 25 MG tablet Take 12.5 mg by mouth daily.     multivitamin-iron-minerals-folic acid (CENTRUM) chewable tablet Chew by mouth.     NIFEdipine (PROCARDIA XL/NIFEDICAL XL) 60 MG 24 hr tablet Take 60 mg by mouth daily.     sodium bicarbonate 650 MG tablet Take 650 mg by mouth 2 (two) times daily.     spironolactone (ALDACTONE) 25 MG tablet Take by mouth.     No current facility-administered medications for this visit.    Review of Systems  Constitutional:  Constitutional negative. HENT: HENT negative.  Eyes: Eyes  negative.  Cardiovascular: Positive for claudication.  GI: Gastrointestinal negative.  Musculoskeletal: Musculoskeletal negative.  Skin: Skin negative.  Neurological: Neurological negative. Hematologic: Hematologic/lymphatic negative.  Psychiatric: Psychiatric negative.        Objective:  Objective   Vitals:   05/22/22 1057 05/22/22 1059  BP: (!) 247/95 (!) 186/134  Pulse: 70   Resp: 20   Temp: 98.3 F (36.8 C)   SpO2: 97%   Weight: 135 lb (61.2 kg)   Height: 5\' 4"  (1.626 m)    Body mass index is 23.17 kg/m.  Physical Exam HENT:     Head: Normocephalic.     Nose: Nose normal.     Mouth/Throat:     Mouth: Mucous membranes are moist.  Eyes:     Pupils: Pupils are equal, round, and reactive to light.  Neck:     Vascular: No carotid bruit.  Cardiovascular:     Rate and Rhythm: Normal rate.  Pulmonary:     Effort: Pulmonary effort is normal.  Abdominal:     General: Abdomen is flat.     Palpations: Abdomen is soft. There is no mass.     Comments: Positive bruit  Skin:    General: Skin is warm and dry.     Capillary Refill: Capillary refill takes less than 2 seconds.  Neurological:     Mental Status: She is alert.  Psychiatric:        Mood and Affect: Mood normal.     Data: Right Renal ArteryPSV cm/sEDV cm/sComment  +------------------+--------+--------+-------+  Origin              771     238            +------------------+--------+--------+-------+  Proximal            275      43            +------------------+--------+--------+-------+  Mid                  86      24            +------------------+--------+--------+-------+  Distal               42      14            +------------------+--------+--------+-------+   +-----------------+--------+--------+-------+  Left Renal ArteryPSV cm/sEDV cm/sComment  +-----------------+--------+--------+-------+  Origin             929     292             +-----------------+--------+--------+-------+  Proximal           229      24            +-----------------+--------+--------+-------+  Mid                172      44            +-----------------+--------+--------+-------+  Distal              81      26            +-----------------+--------+--------+-------+      Technologist observations: Bilateral renal pelvis filling noted, no  evidence of hydronephrosis.   +------------+--------+--------+----+-----------+--------+--------+----+  Right KidneyPSV cm/sEDV cm/sRI  Left KidneyPSV cm/sEDV cm/sRI    +------------+--------+--------+----+-----------+--------+--------+----+  Upper Pole  12      4       0.69Upper Pole 14      4       0.69  +------------+--------+--------+----+-----------+--------+--------+----+  Mid         16      5       0.66Mid        16      5       0.66  +------------+--------+--------+----+-----------+--------+--------+----+  Lower Pole  10      3       0.65Lower Pole 15      5       0.66  +------------+--------+--------+----+-----------+--------+--------+----+  Hilar       13      4       0.69Hilar      27      10      0.63  +------------+--------+--------+----+-----------+--------+--------+----+   +------------------+-------+------------------+--------+  Right Kidney             Left Kidney                 +------------------+-------+------------------+--------+  RAR                      RAR                         +------------------+-------+------------------+--------+  RAR (manual)      5.7    RAR (manual)      6.8       +------------------+-------+------------------+--------+  Cortex                   Cortex                      +------------------+-------+------------------+--------+  Cortex thickness  9.00 mmCorex thickness   10.00 mm  +------------------+-------+------------------+--------+  Kidney length (cm)8.4     Kidney length (cm)8.5       +------------------+-------+------------------+--------+      Summary:  Largest Aortic Diameter: 2.0 cm     Renal:     Right: Abnormal size for the right kidney. Normal right Resisitive         Index. Normal cortical thickness of right kidney. Evidence of         a > 60% stenosis of the right renal artery. RRV flow present.  Left:  Abnormal size for the left kidney. Normal left Resistive         Index. Normal cortical thickness of the left kidney. Evidence         of a > 60% stenosis in the left renal artery. LRV flow  present.  Mesenteric:  Normal Superior Mesenteric artery findings. 70 to 99% stenosis in the  celiac  artery.     No evidence of right iliac stenosis seen, moderate atherosclerosis noted  throughout.  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      Assessment/Plan:    76 year old female with bilateral high-grade renal artery stenosis with normal resistive indices although her kidneys are somewhat on the small side.  I discussed possibly obtaining a CT scan although she does have a previous CT scan from 2021 which did not demonstrate any extreme calcification and also the CAT scan will need to be done without contrast given her elevated creatinine.  Rather we will proceed with angiography from the right common femoral approach to evaluate stenting of the bilateral renal arteries given that the resistive indices were low even though the kidneys are quite small.  Depending on her renal function and the amount of contrast used at that time we can consider limited evaluation of her bilateral lower extremities at that time as well.  We discussed the risk benefits alternatives she demonstrates good understanding we will get her scheduled on Monday in the near future.     Waynetta Sandy MD [facility]

## 2022-05-24 ENCOUNTER — Other Ambulatory Visit: Payer: Self-pay

## 2022-05-24 DIAGNOSIS — I701 Atherosclerosis of renal artery: Secondary | ICD-10-CM

## 2022-06-10 ENCOUNTER — Encounter (HOSPITAL_COMMUNITY)
Admission: RE | Disposition: A | Discharge status: Home / Self Care | Payer: Self-pay | Source: Ambulatory Visit | Attending: Vascular Surgery

## 2022-06-10 ENCOUNTER — Other Ambulatory Visit: Payer: Self-pay

## 2022-06-10 ENCOUNTER — Ambulatory Visit (HOSPITAL_COMMUNITY)
Admission: RE | Admit: 2022-06-10 | Discharge: 2022-06-10 | Disposition: A | Discharge status: Home / Self Care | Payer: POS | Source: Ambulatory Visit | Attending: Vascular Surgery | Admitting: Vascular Surgery

## 2022-06-10 DIAGNOSIS — Z87891 Personal history of nicotine dependence: Secondary | ICD-10-CM | POA: Diagnosis not present

## 2022-06-10 DIAGNOSIS — I10 Essential (primary) hypertension: Secondary | ICD-10-CM | POA: Diagnosis not present

## 2022-06-10 DIAGNOSIS — I701 Atherosclerosis of renal artery: Secondary | ICD-10-CM | POA: Insufficient documentation

## 2022-06-10 DIAGNOSIS — E785 Hyperlipidemia, unspecified: Secondary | ICD-10-CM | POA: Insufficient documentation

## 2022-06-10 DIAGNOSIS — I70211 Atherosclerosis of native arteries of extremities with intermittent claudication, right leg: Secondary | ICD-10-CM | POA: Diagnosis not present

## 2022-06-10 DIAGNOSIS — I739 Peripheral vascular disease, unspecified: Secondary | ICD-10-CM | POA: Insufficient documentation

## 2022-06-10 HISTORY — PX: RENAL ANGIOGRAPHY: CATH118260

## 2022-06-10 HISTORY — PX: PERIPHERAL VASCULAR INTERVENTION: CATH118257

## 2022-06-10 LAB — POCT I-STAT, CHEM 8
BUN: 39 mg/dL — ABNORMAL HIGH (ref 8–23)
Calcium, Ion: 1.2 mmol/L (ref 1.15–1.40)
Chloride: 108 mmol/L (ref 98–111)
Creatinine, Ser: 1.9 mg/dL — ABNORMAL HIGH (ref 0.44–1.00)
Glucose, Bld: 88 mg/dL (ref 70–99)
HCT: 33 % — ABNORMAL LOW (ref 36.0–46.0)
Hemoglobin: 11.2 g/dL — ABNORMAL LOW (ref 12.0–15.0)
Potassium: 4.2 mmol/L (ref 3.5–5.1)
Sodium: 139 mmol/L (ref 135–145)
TCO2: 22 mmol/L (ref 22–32)

## 2022-06-10 SURGERY — RENAL ANGIOGRAPHY
Anesthesia: LOCAL | Laterality: Left

## 2022-06-10 MED ORDER — CLOPIDOGREL BISULFATE 300 MG PO TABS
ORAL_TABLET | ORAL | Status: DC | PRN
  Administered 2022-06-10: 300 mg via ORAL

## 2022-06-10 MED ORDER — SODIUM CHLORIDE 0.9 % IV SOLN: 250.0000 mL | INTRAVENOUS | Status: DC | PRN

## 2022-06-10 MED ORDER — ACETAMINOPHEN 325 MG PO TABS: 650.0000 mg | ORAL_TABLET | ORAL | Status: DC | PRN

## 2022-06-10 MED ORDER — LABETALOL HCL 5 MG/ML IV SOLN: 10.0000 mg | INTRAVENOUS | Status: DC | PRN

## 2022-06-10 MED ORDER — MIDAZOLAM HCL 2 MG/2ML IJ SOLN
INTRAMUSCULAR | Status: AC
  Filled 2022-06-10: qty 2

## 2022-06-10 MED ORDER — MIDAZOLAM HCL 2 MG/2ML IJ SOLN
INTRAMUSCULAR | Status: DC | PRN
  Administered 2022-06-10: 1 mg via INTRAVENOUS

## 2022-06-10 MED ORDER — HEPARIN (PORCINE) IN NACL 1000-0.9 UT/500ML-% IV SOLN
INTRAVENOUS | Status: DC | PRN
  Administered 2022-06-10 (×2): 500 mL

## 2022-06-10 MED ORDER — CLOPIDOGREL BISULFATE 75 MG PO TABS: 75.0000 mg | ORAL_TABLET | Freq: Every day | ORAL | Status: DC

## 2022-06-10 MED ORDER — LIDOCAINE HCL (PF) 1 % IJ SOLN
INTRAMUSCULAR | Status: AC
  Filled 2022-06-10: qty 30

## 2022-06-10 MED ORDER — HYDRALAZINE HCL 20 MG/ML IJ SOLN: 5.0000 mg | INTRAMUSCULAR | Status: DC | PRN

## 2022-06-10 MED ORDER — CLOPIDOGREL BISULFATE 75 MG PO TABS: 75.0000 mg | ORAL_TABLET | Freq: Every day | ORAL | 11 refills | Status: DC

## 2022-06-10 MED ORDER — SODIUM CHLORIDE 0.9% FLUSH: 3.0000 mL | INTRAVENOUS | Status: DC | PRN

## 2022-06-10 MED ORDER — SODIUM CHLORIDE 0.9 % IV SOLN: INTRAVENOUS | Status: DC

## 2022-06-10 MED ORDER — CLOPIDOGREL BISULFATE 75 MG PO TABS: 300.0000 mg | ORAL_TABLET | Freq: Once | ORAL | Status: DC

## 2022-06-10 MED ORDER — SODIUM CHLORIDE 0.9 % WEIGHT BASED INFUSION: 1.0000 mL/kg/h | INTRAVENOUS | Status: DC

## 2022-06-10 MED ORDER — CLOPIDOGREL BISULFATE 300 MG PO TABS
ORAL_TABLET | ORAL | Status: AC
  Filled 2022-06-10: qty 1

## 2022-06-10 MED ORDER — HEPARIN SODIUM (PORCINE) 1000 UNIT/ML IJ SOLN
INTRAMUSCULAR | Status: DC | PRN
  Administered 2022-06-10: 5000 [IU] via INTRAVENOUS

## 2022-06-10 MED ORDER — SODIUM CHLORIDE 0.9% FLUSH: 3.0000 mL | Freq: Two times a day (BID) | INTRAVENOUS | Status: DC

## 2022-06-10 MED ORDER — HEPARIN SODIUM (PORCINE) 1000 UNIT/ML IJ SOLN
INTRAMUSCULAR | Status: AC
  Filled 2022-06-10: qty 10

## 2022-06-10 MED ORDER — ONDANSETRON HCL 4 MG/2ML IJ SOLN: 4.0000 mg | Freq: Four times a day (QID) | INTRAMUSCULAR | Status: DC | PRN

## 2022-06-10 MED ORDER — HEPARIN (PORCINE) IN NACL 1000-0.9 UT/500ML-% IV SOLN
INTRAVENOUS | Status: AC
  Filled 2022-06-10: qty 1000

## 2022-06-10 MED ORDER — FENTANYL CITRATE (PF) 100 MCG/2ML IJ SOLN
INTRAMUSCULAR | Status: AC
  Filled 2022-06-10: qty 2

## 2022-06-10 MED ORDER — MORPHINE SULFATE (PF) 2 MG/ML IV SOLN: 2.0000 mg | INTRAVENOUS | Status: DC | PRN

## 2022-06-10 MED ORDER — OXYCODONE HCL 5 MG PO TABS: 5.0000 mg | ORAL_TABLET | ORAL | Status: DC | PRN

## 2022-06-10 MED ORDER — LIDOCAINE HCL (PF) 1 % IJ SOLN
INTRAMUSCULAR | Status: DC | PRN
  Administered 2022-06-10: 20 mL via INTRADERMAL

## 2022-06-10 MED ORDER — FENTANYL CITRATE (PF) 100 MCG/2ML IJ SOLN
INTRAMUSCULAR | Status: DC | PRN
  Administered 2022-06-10: 50 ug via INTRAVENOUS

## 2022-06-10 MED ORDER — IODIXANOL 320 MG/ML IV SOLN
INTRAVENOUS | Status: DC | PRN
  Administered 2022-06-10: 80 mL via INTRA_ARTERIAL

## 2022-06-10 SURGICAL SUPPLY — 19 items
BALLN VIATRAC 5.5X20X135 (BALLOONS) ×3
BALLN ~~LOC~~ EMERGE MR 5.5X12 (BALLOONS) ×3
BALLOON VIATRAC 5.5X20X135 (BALLOONS) ×1 IMPLANT
BALLOON ~~LOC~~ EMERGE MR 5.5X12 (BALLOONS) ×1 IMPLANT
CATH OMNI FLUSH 5F 65CM (CATHETERS) ×1 IMPLANT
CLOSURE MYNX CONTROL 6F/7F (Vascular Products) ×1 IMPLANT
GUIDE CATH VISTA IMA 6F (CATHETERS) ×1 IMPLANT
GUIDE CATH VISTA JR4 6F (CATHETERS) ×1 IMPLANT
KIT MICROPUNCTURE NIT STIFF (SHEATH) ×1 IMPLANT
KIT PV (KITS) ×3 IMPLANT
SHEATH PINNACLE 6F 10CM (SHEATH) ×1 IMPLANT
SHEATH PROBE COVER 6X72 (BAG) ×1 IMPLANT
STENT HERCULINK RX 5.0X12X135 (Permanent Stent) ×1 IMPLANT
STOPCOCK MORSE 400PSI 3WAY (MISCELLANEOUS) ×1 IMPLANT
TRANSDUCER W/STOPCOCK (MISCELLANEOUS) ×3 IMPLANT
TRAY PV CATH (CUSTOM PROCEDURE TRAY) ×3 IMPLANT
WIRE BENTSON .035X145CM (WIRE) ×3 IMPLANT
WIRE SPARTACORE .014X190CM (WIRE) ×1 IMPLANT
WIRE STABILIZER XS .014X180CM (WIRE) ×1 IMPLANT

## 2022-06-10 NOTE — Discharge Instructions (Signed)
Femoral Site Care This sheet gives you information about how to care for yourself after your procedure. Your health care provider may also give you more specific instructions. If you have problems or questions, contact your health care provider. What can I expect after the procedure?  After the procedure, it is common to have: Bruising that usually fades within 1-2 weeks. Tenderness at the site. Follow these instructions at home: Wound care Follow instructions from your health care provider about how to take care of your insertion site. Make sure you: Wash your hands with soap and water before you change your bandage (dressing). If soap and water are not available, use hand sanitizer. Remove your dressing as told by your health care provider. In 24 hours Do not take baths, swim, or use a hot tub until your health care provider approves. You may shower 24-48 hours after the procedure or as told by your health care provider. Gently wash the site with plain soap and water. Pat the area dry with a clean towel. Do not rub the site. This may cause bleeding. Do not apply powder or lotion to the site. Keep the site clean and dry. Check your femoral site every day for signs of infection. Check for: Redness, swelling, or pain. Fluid or blood. Warmth. Pus or a bad smell. Activity For the first 2-3 days after your procedure, or as long as directed: Avoid climbing stairs as much as possible. Do not squat. Do not lift anything that is heavier than 10 lb (4.5 kg), or the limit that you are told, until your health care provider says that it is safe. For 5 days Rest as directed. Avoid sitting for a long time without moving. Get up to take short walks every 1-2 hours. Do not drive for 24 hours if you were given a medicine to help you relax (sedative). General instructions Take over-the-counter and prescription medicines only as told by your health care provider. Keep all follow-up visits as told by  your health care provider. This is important. Contact a health care provider if you have: A fever or chills. You have redness, swelling, or pain around your insertion site. Get help right away if: The catheter insertion area swells very fast. You pass out. You suddenly start to sweat or your skin gets clammy. The catheter insertion area is bleeding, and the bleeding does not stop when you hold steady pressure on the area. The area near or just beyond the catheter insertion site becomes pale, cool, tingly, or numb. These symptoms may represent a serious problem that is an emergency. Do not wait to see if the symptoms will go away. Get medical help right away. Call your local emergency services (911 in the [place] Do not drive yourself to the hospital. Summary After the procedure, it is common to have bruising that usually fades within 1-2 weeks. Check your femoral site every day for signs of infection. Do not lift anything that is heavier than 10 lb (4.5 kg), or the limit that you are told, until your health care provider says that it is safe. This information is not intended to replace advice given to you by your health care provider. Make sure you discuss any questions you have with your health care provider. Document Revised: 09/01/2017 Document Reviewed: 09/01/2017 [employer]vier Patient Education  2020 [employer] 

## 2022-06-10 NOTE — Op Note (Signed)
    Patient name: Andrea Ross MRN: [id number] DOB: [birth year] Sex: female  06/10/2022 Pre-operative Diagnosis: Bilateral renal artery stenosis, claudication Post-operative diagnosis:  Same Surgeon:  Eda Paschal. Donzetta Matters, MD Procedure Performed: 1.  Ultrasound-guided cannulation right common femoral artery 2.  Aortogram 3.  Stent of left renal artery with 5 x 58mm Herculink 4.  Moderate sedation with fentanyl and Versed for 71 minutes  Indications: 76 year old female with history of bilateral renal artery stenosis she also has claudication right greater than left after walking significant distance.  Renal artery stenosis symptomatic with hypertension and progressive renal failure.  She is indicated for aortogram with possible intervention.  Findings: Bilateral renal arteries were very tightly stenosed over 90%.  We were able to stent the left renal artery and this was reduced to 0% stenosis and immediately her blood pressure did drop approximately 80 points from systolic 654 down to systolic 650 mmHg.  I was able to cannulate the right renal artery but unable to pass a wire.  We will see how patient's blood pressure and creatinine responds to left renal artery stent and we can consider repeat attempt at right renal artery stent in the future if necessary.   Procedure:  The patient was identified in the holding area and taken to room 8.  The patient was then placed supine on the table and prepped and draped in the usual sterile fashion.  A time out was called.  Ultrasound was used to evaluate the right common femoral artery.  There was some calcification but no evidence of stenosis.  The area was anesthetized 1% lidocaine cannulated micropuncture needle followed by wire and the sheath.  And images saved department record.  Concomitantly she was administered fentanyl and Versed and her vital signs were monitored by bedside nursing throughout the case.  We placed a Bentson wire followed by 5 [place] sheath  and Omni catheter to the level of T12 performed aortogram followed by magnified imaging to demonstrate bilateral renal artery stenosis.  Patient was then given 5000 units of heparin.  We were able to cannulate the left renal artery with a IM guide catheter and placed an 014 wire.  We then primarily stented with a 5 x 12 mm Herculink.  This was then postdilated with a 5 mm balloon directed cephalad.  We then turned our attention to the right side unfortunately this was unable to pass a wire.  Given her pressure had dropped significantly after stenting of the left we elected termination of the case.  Wire and catheters were removed.  We deployed a minx device on the right.  She tolerated procedure without any complication.   Contrast: 80cc   Saidi Santacroce C. Donzetta Matters, MD [facility] Office: [phone] Pager: [phone]

## 2022-06-10 NOTE — Interval H&P Note (Signed)
History and Physical Interval Note:  06/10/2022 7:16 AM  Andrea Ross  has presented today for surgery, with the diagnosis of bilateral renal artery stenosis.  The various methods of treatment have been discussed with the patient and family. After consideration of risks, benefits and other options for treatment, the patient has consented to  Procedure(s): ABDOMINAL AORTOGRAM W/LOWER EXTREMITY (N/A) as a surgical intervention.  The patient's history has been reviewed, patient examined, no change in status, stable for surgery.  I have reviewed the patient's chart and labs.  Questions were answered to the patient's satisfaction.     [name]

## 2022-06-10 NOTE — Progress Notes (Signed)
Annie Main, RN notified client at [employer] this am

## 2022-06-11 ENCOUNTER — Encounter (HOSPITAL_COMMUNITY): Payer: Self-pay | Admitting: Vascular Surgery

## 2022-06-12 ENCOUNTER — Telehealth: Payer: Self-pay | Admitting: Family

## 2022-06-12 NOTE — Telephone Encounter (Signed)
Ok to use a stool softener or mild laxative.

## 2022-06-12 NOTE — Telephone Encounter (Signed)
Please advise covering pcp?

## 2022-06-16 ENCOUNTER — Inpatient Hospital Stay (HOSPITAL_COMMUNITY): Payer: POS

## 2022-06-16 ENCOUNTER — Inpatient Hospital Stay (HOSPITAL_COMMUNITY)
Admission: EM | Admit: 2022-06-16 | Discharge: 2022-06-18 | DRG: 305 | Disposition: A | Discharge status: Home Health | Payer: POS | Attending: Family Medicine | Admitting: Family Medicine

## 2022-06-16 ENCOUNTER — Emergency Department (HOSPITAL_COMMUNITY): Payer: POS

## 2022-06-16 ENCOUNTER — Encounter (HOSPITAL_COMMUNITY): Payer: Self-pay

## 2022-06-16 ENCOUNTER — Other Ambulatory Visit: Payer: Self-pay

## 2022-06-16 DIAGNOSIS — Z8249 Family history of ischemic heart disease and other diseases of the circulatory system: Secondary | ICD-10-CM | POA: Diagnosis not present

## 2022-06-16 DIAGNOSIS — D631 Anemia in chronic kidney disease: Secondary | ICD-10-CM | POA: Diagnosis present

## 2022-06-16 DIAGNOSIS — Z8673 Personal history of transient ischemic attack (TIA), and cerebral infarction without residual deficits: Secondary | ICD-10-CM

## 2022-06-16 DIAGNOSIS — Z87891 Personal history of nicotine dependence: Secondary | ICD-10-CM | POA: Diagnosis not present

## 2022-06-16 DIAGNOSIS — E785 Hyperlipidemia, unspecified: Secondary | ICD-10-CM | POA: Diagnosis present

## 2022-06-16 DIAGNOSIS — I739 Peripheral vascular disease, unspecified: Secondary | ICD-10-CM | POA: Diagnosis present

## 2022-06-16 DIAGNOSIS — I701 Atherosclerosis of renal artery: Secondary | ICD-10-CM | POA: Diagnosis present

## 2022-06-16 DIAGNOSIS — Z880 Allergy status to penicillin: Secondary | ICD-10-CM

## 2022-06-16 DIAGNOSIS — Z7902 Long term (current) use of antithrombotics/antiplatelets: Secondary | ICD-10-CM

## 2022-06-16 DIAGNOSIS — N182 Chronic kidney disease, stage 2 (mild): Secondary | ICD-10-CM | POA: Diagnosis present

## 2022-06-16 DIAGNOSIS — I1A0 Resistant hypertension: Secondary | ICD-10-CM | POA: Diagnosis present

## 2022-06-16 DIAGNOSIS — Z841 Family history of disorders of kidney and ureter: Secondary | ICD-10-CM | POA: Diagnosis not present

## 2022-06-16 DIAGNOSIS — Z833 Family history of diabetes mellitus: Secondary | ICD-10-CM

## 2022-06-16 DIAGNOSIS — Z91018 Allergy to other foods: Secondary | ICD-10-CM

## 2022-06-16 DIAGNOSIS — Z1152 Encounter for screening for COVID-19: Secondary | ICD-10-CM | POA: Diagnosis not present

## 2022-06-16 DIAGNOSIS — H409 Unspecified glaucoma: Secondary | ICD-10-CM | POA: Diagnosis present

## 2022-06-16 DIAGNOSIS — Z79899 Other long term (current) drug therapy: Secondary | ICD-10-CM | POA: Diagnosis not present

## 2022-06-16 DIAGNOSIS — I130 Hypertensive heart and chronic kidney disease with heart failure and stage 1 through stage 4 chronic kidney disease, or unspecified chronic kidney disease: Secondary | ICD-10-CM | POA: Diagnosis present

## 2022-06-16 DIAGNOSIS — N1832 Chronic kidney disease, stage 3b: Secondary | ICD-10-CM | POA: Diagnosis present

## 2022-06-16 DIAGNOSIS — I169 Hypertensive crisis, unspecified: Secondary | ICD-10-CM | POA: Diagnosis present

## 2022-06-16 DIAGNOSIS — I5032 Chronic diastolic (congestive) heart failure: Secondary | ICD-10-CM | POA: Diagnosis present

## 2022-06-16 DIAGNOSIS — Z809 Family history of malignant neoplasm, unspecified: Secondary | ICD-10-CM | POA: Diagnosis not present

## 2022-06-16 DIAGNOSIS — R531 Weakness: Principal | ICD-10-CM

## 2022-06-16 DIAGNOSIS — I5030 Unspecified diastolic (congestive) heart failure: Secondary | ICD-10-CM | POA: Clinically undetermined

## 2022-06-16 DIAGNOSIS — Z823 Family history of stroke: Secondary | ICD-10-CM | POA: Diagnosis not present

## 2022-06-16 DIAGNOSIS — I10 Essential (primary) hypertension: Secondary | ICD-10-CM

## 2022-06-16 DIAGNOSIS — Z7982 Long term (current) use of aspirin: Secondary | ICD-10-CM

## 2022-06-16 LAB — BASIC METABOLIC PANEL
Anion gap: 13 (ref 5–15)
BUN: 45 mg/dL — ABNORMAL HIGH (ref 8–23)
CO2: 23 mmol/L (ref 22–32)
Calcium: 9.1 mg/dL (ref 8.9–10.3)
Chloride: 99 mmol/L (ref 98–111)
Creatinine, Ser: 1.67 mg/dL — ABNORMAL HIGH (ref 0.44–1.00)
GFR, Estimated: 31 mL/min — ABNORMAL LOW (ref >60)
Glucose, Bld: 96 mg/dL (ref 70–99)
Potassium: 4.6 mmol/L (ref 3.5–5.1)
Sodium: 135 mmol/L (ref 135–145)

## 2022-06-16 LAB — URINALYSIS, ROUTINE W REFLEX MICROSCOPIC
Bilirubin Urine: NEGATIVE
Glucose, UA: NEGATIVE mg/dL
Hgb urine dipstick: NEGATIVE
Ketones, ur: NEGATIVE mg/dL
Leukocytes,Ua: NEGATIVE
Nitrite: NEGATIVE
Protein, ur: NEGATIVE mg/dL
Specific Gravity, Urine: 1.005 (ref 1.005–1.030)
pH: 8 (ref 5.0–8.0)

## 2022-06-16 LAB — CBC
HCT: 32.3 % — ABNORMAL LOW (ref 36.0–46.0)
Hemoglobin: 10.8 g/dL — ABNORMAL LOW (ref 12.0–15.0)
MCH: 28.4 pg (ref 26.0–34.0)
MCHC: 33.4 g/dL (ref 30.0–36.0)
MCV: 85 fL (ref 80.0–100.0)
Platelets: 240 10*3/uL (ref 150–400)
RBC: 3.8 MIL/uL — ABNORMAL LOW (ref 3.87–5.11)
RDW: 14.4 % (ref 11.5–15.5)
WBC: 8.5 10*3/uL (ref 4.0–10.5)
nRBC: 0 % (ref 0.0–0.2)

## 2022-06-16 LAB — CBG MONITORING, ED: Glucose-Capillary: 94 mg/dL (ref 70–99)

## 2022-06-16 LAB — D-DIMER, QUANTITATIVE: D-Dimer, Quant: 1.47 ug/mL-FEU — ABNORMAL HIGH (ref 0.00–0.50)

## 2022-06-16 MED ORDER — LOSARTAN POTASSIUM 25 MG PO TABS
12.5000 mg | ORAL_TABLET | Freq: Every day | ORAL | Status: DC
  Administered 2022-06-16 – 2022-06-18 (×3): 12.5 mg via ORAL
  Filled 2022-06-16 (×3): qty 1

## 2022-06-16 MED ORDER — CHLORTHALIDONE 25 MG PO TABS
25.0000 mg | ORAL_TABLET | Freq: Every day | ORAL | Status: DC
  Administered 2022-06-16 – 2022-06-17 (×2): 25 mg via ORAL
  Filled 2022-06-16 (×2): qty 1

## 2022-06-16 MED ORDER — HYDRALAZINE HCL 25 MG PO TABS
100.0000 mg | ORAL_TABLET | Freq: Once | ORAL | Status: AC
  Administered 2022-06-16: 100 mg via ORAL
  Filled 2022-06-16: qty 4

## 2022-06-16 MED ORDER — IOHEXOL 350 MG/ML SOLN
75.0000 mL | Freq: Once | INTRAVENOUS | Status: AC | PRN
  Administered 2022-06-16: 60 mL via INTRAVENOUS

## 2022-06-16 MED ORDER — ALBUTEROL SULFATE HFA 108 (90 BASE) MCG/ACT IN AERS: 2.0000 | INHALATION_SPRAY | RESPIRATORY_TRACT | Status: DC | PRN

## 2022-06-16 MED ORDER — DOXAZOSIN MESYLATE 2 MG PO TABS
2.0000 mg | ORAL_TABLET | Freq: Every day | ORAL | Status: DC
  Administered 2022-06-16 – 2022-06-18 (×3): 2 mg via ORAL
  Filled 2022-06-16 (×3): qty 1

## 2022-06-16 MED ORDER — CARVEDILOL 12.5 MG PO TABS
12.5000 mg | ORAL_TABLET | Freq: Two times a day (BID) | ORAL | Status: DC
  Administered 2022-06-16 – 2022-06-17 (×2): 12.5 mg via ORAL
  Filled 2022-06-16 (×2): qty 1

## 2022-06-16 MED ORDER — SODIUM CHLORIDE 0.9 % IV BOLUS
500.0000 mL | Freq: Once | INTRAVENOUS | Status: AC
  Administered 2022-06-17: 500 mL via INTRAVENOUS

## 2022-06-16 NOTE — ED Notes (Signed)
Patient returned from CT

## 2022-06-16 NOTE — ED Triage Notes (Signed)
Pov from home. Cc of weakness for over a week. Said that she isnt feeling any better after getting some stents placed. Family thinks she is feeling worse. 100% on room air after ambulating in the room. Says that she feels weak and short of breath.

## 2022-06-16 NOTE — ED Notes (Signed)
Patient transported to CT 

## 2022-06-16 NOTE — ED Provider Notes (Signed)
[facility] EMERGENCY DEPARTMENT Provider Note   CSN: [id number] Arrival date & time: 06/16/22  1904     History  Chief Complaint  Patient presents with   Weakness    Andrea Ross is a 76 y.o. female.  Patient reports she had a renal stent placed October 9.  Patient reports that since the time of having the stent placed she has had increasing weakness.  Patient reports her blood pressure was low at home today.  Patient reports she was too weak to walk.  Patient denies having any fever or chills she has not had any cough or congestion patient denies any chest pain she denies any shortness of breath.  Patient reports the cath site looks well she has not had any redness swelling or any drainage.  Patient reports that she sees Dr. Theador Hawthorne nephrologist and he advised her to have stent placed.   Weakness Severity:  Moderate Onset quality:  Sudden Duration:  6 days Timing:  Constant Progression:  Worsening Chronicity:  New Context: not alcohol use, not dehydration and not recent infection        Home Medications Prior to Admission medications   Medication Sig Start Date End Date Taking? Authorizing Provider  amLODipine (NORVASC) 10 MG tablet Take 1 tablet (10 mg total) by mouth every evening. Patient not taking: Reported on 06/04/2022 04/01/22   Skeet Latch, MD  aspirin 81 MG chewable tablet Chew 81 mg by mouth daily.    [provider]  atorvastatin (LIPITOR) 40 MG tablet Take 1 tablet (40 mg total) by mouth daily. 03/07/22   Evelina Dun A, FNP  Calcium Carbonate-Vit D-Min (CALCIUM 600+D3 PLUS MINERALS) 600-800 MG-UNIT TABS Take 1 tablet by mouth daily.    [provider]  carvedilol (COREG) 12.5 MG tablet Take 12.5 mg by mouth 2 (two) times daily. 03/28/22   [provider]  chlorthalidone (HYGROTON) 25 MG tablet Take 25 mg by mouth daily. 04/14/21   [provider]  Cholecalciferol 50 MCG (2000 UT) TABS Take 2,000 Units by mouth daily.     [provider]  clopidogrel (PLAVIX) 75 MG tablet Take 1 tablet (75 mg total) by mouth daily. 06/10/22 06/10/23  Waynetta Sandy, MD  doxazosin (CARDURA) 2 MG tablet Take 1 tablet (2 mg total) by mouth daily. 04/01/22   Skeet Latch, MD  hydrALAZINE (APRESOLINE) 100 MG tablet Take 100 mg by mouth 3 (three) times daily.    [provider]  losartan (COZAAR) 25 MG tablet Take 12.5 mg by mouth daily. 05/02/22   [provider]  multivitamin-iron-minerals-folic acid (CENTRUM) chewable tablet Chew 1 tablet by mouth daily.    [provider]  NIFEdipine (PROCARDIA XL/NIFEDICAL XL) 60 MG 24 hr tablet Take 60 mg by mouth See admin instructions. Take with 30 for a total of 90 mg at bedtime 05/02/22   [provider]  NIFEdipine (PROCARDIA-XL/NIFEDICAL-XL) 30 MG 24 hr tablet Take 30 mg by mouth See admin instructions. Take with 60 mg for a total of 90 mg at bedtime 05/24/22   [provider]  spironolactone (ALDACTONE) 25 MG tablet Take 25 mg by mouth daily. 02/09/20   [provider]      Allergies    Pepperoni [pickled meat] and Penicillins    Review of Systems   Review of Systems  Neurological:  Positive for weakness.  All other systems reviewed and are negative.   Physical Exam Updated Vital Signs BP (!) 194/88   Pulse Marland Kitchen)  121   Temp 97.6 F (36.4 C) (Oral)   Resp 20   Ht 5\' 4"  (1.626 m)   Wt 63.2 kg   SpO2 93%   BMI 23.90 kg/m  Physical Exam Vitals and nursing note reviewed.  Constitutional:      Appearance: She is well-developed.  HENT:     Head: Normocephalic.     Right Ear: External ear normal.     Left Ear: External ear normal.     Nose: Nose normal.     Mouth/Throat:     Mouth: Mucous membranes are moist.  Eyes:     Pupils: Pupils are equal, round, and reactive to light.  Cardiovascular:     Rate and Rhythm: Normal rate.  Pulmonary:     Comments: Tachypnea,  Pt becomes short of breath with talking   Abdominal:     General: There is no distension.  Musculoskeletal:        General: Normal range of motion.     Cervical back: Normal range of motion.  Skin:    General: Skin is warm.  Neurological:     General: No focal deficit present.     Mental Status: She is alert and oriented to person, place, and time.  Psychiatric:        Mood and Affect: Mood normal.     ED Results / Procedures / Treatments   Labs (all labs ordered are listed, but only abnormal results are displayed) Labs Reviewed  BASIC METABOLIC PANEL - Abnormal; Notable for the following components:      Result Value   BUN 45 (*)    Creatinine, Ser 1.67 (*)    GFR, Estimated 31 (*)    All other components within normal limits  CBC - Abnormal; Notable for the following components:   RBC 3.80 (*)    Hemoglobin 10.8 (*)    HCT 32.3 (*)    All other components within normal limits  URINALYSIS, ROUTINE W REFLEX MICROSCOPIC - Abnormal; Notable for the following components:   Color, Urine STRAW (*)    All other components within normal limits  D-DIMER, QUANTITATIVE - Abnormal; Notable for the following components:   D-Dimer, Quant 1.47 (*)    All other components within normal limits  CBG MONITORING, ED    EKG EKG Interpretation  Date/Time:  Sunday June 16 2022 19:26:41 EDT Ventricular Rate:  65 PR Interval:  154 QRS Duration: 92 QT Interval:  429 QTC Calculation: 447 R Axis:   62 Text Interpretation: Sinus rhythm Consider left ventricular hypertrophy Confirmed by Noemi Chapel [phone]) on 06/16/2022 7:48:41 PM  Radiology DG Chest 2 View  Result Date: 06/16/2022 CLINICAL DATA:  Shortness of breath. EXAM: CHEST - 2 VIEW COMPARISON:  None Available. FINDINGS: The heart size and mediastinal contours are within normal limits. Both lungs are clear. The visualized skeletal structures are unremarkable. IMPRESSION: No active cardiopulmonary disease. Electronically Signed   By: Ronney Asters M.D.   On: 06/16/2022  19:51    Procedures Procedures    Medications Ordered in ED Medications  albuterol (VENTOLIN HFA) 108 (90 Base) MCG/ACT inhaler 2 puff (has no administration in time range)  carvedilol (COREG) tablet 12.5 mg (has no administration in time range)  chlorthalidone (HYGROTON) tablet 25 mg (25 mg Oral Given 06/16/22 2244)  doxazosin (CARDURA) tablet 2 mg (2 mg Oral Given 06/16/22 2245)  losartan (COZAAR) tablet 12.5 mg (has no administration in time range)  hydrALAZINE (APRESOLINE) tablet 100 mg (100 mg Oral  Given 06/16/22 2244)    ED Course/ Medical Decision Making/ A&P                           Medical Decision Making Patient complains of weakness since having a stent placed on October 9.  Patient reports she had stenting of her renal artery due to stenosis  Amount and/or Complexity of Data Reviewed Independent Historian: caregiver    Details: Patient is here with family who are supportive External Data Reviewed: notes.    Details: Vascular surgeon's notes are reviewed.  Nephrology notes reviewed Labs: ordered. Decision-making details documented in ED Course.    Details: Labs ordered reviewed and interpreted BUN is 45 creatinine is 1.67 this is at patient's baseline patient's hemoglobin is 10.8 this is also at baseline.  Labs are ordered reviewed and interpreted Radiology: ordered and independent interpretation performed.    Details: Chest x-ray is obtained chest x-ray shows no evidence of acute disease. Ct head is negative  Discussion of management or test interpretation with external provider(s): Hospitalist is consulted for admission   Risk Prescription drug management. Risk Details: Dr. Sabra Heck in to see and examine patient.  He advised obtaining a head CT and D-dimer.  Patient's head CT is negative.  D-dimer is elevated to 1.47. Pt can not have a ct angio for pe evaluation.  Pt will need VQ scan.               Final Clinical Impression(s) / ED Diagnoses Final  diagnoses:  Generalized weakness  Hypertension, unspecified type    Rx / DC Orders ED Discharge Orders     None         Sidney Ace 06/16/22 2324    Noemi Chapel, MD 06/18/22 1212

## 2022-06-17 ENCOUNTER — Inpatient Hospital Stay (HOSPITAL_COMMUNITY): Payer: POS

## 2022-06-17 DIAGNOSIS — N182 Chronic kidney disease, stage 2 (mild): Secondary | ICD-10-CM | POA: Diagnosis present

## 2022-06-17 DIAGNOSIS — I1A0 Resistant hypertension: Secondary | ICD-10-CM

## 2022-06-17 DIAGNOSIS — R531 Weakness: Secondary | ICD-10-CM

## 2022-06-17 DIAGNOSIS — Z8673 Personal history of transient ischemic attack (TIA), and cerebral infarction without residual deficits: Secondary | ICD-10-CM | POA: Diagnosis not present

## 2022-06-17 DIAGNOSIS — E785 Hyperlipidemia, unspecified: Secondary | ICD-10-CM

## 2022-06-17 LAB — CBC WITH DIFFERENTIAL/PLATELET
Abs Immature Granulocytes: 0.04 10*3/uL (ref 0.00–0.07)
Basophils Absolute: 0 10*3/uL (ref 0.0–0.1)
Basophils Relative: 0 %
Eosinophils Absolute: 0.1 10*3/uL (ref 0.0–0.5)
Eosinophils Relative: 2 %
HCT: 31.1 % — ABNORMAL LOW (ref 36.0–46.0)
Hemoglobin: 10.3 g/dL — ABNORMAL LOW (ref 12.0–15.0)
Immature Granulocytes: 1 %
Lymphocytes Relative: 19 %
Lymphs Abs: 1.4 10*3/uL (ref 0.7–4.0)
MCH: 28.2 pg (ref 26.0–34.0)
MCHC: 33.1 g/dL (ref 30.0–36.0)
MCV: 85.2 fL (ref 80.0–100.0)
Monocytes Absolute: 1 10*3/uL (ref 0.1–1.0)
Monocytes Relative: 13 %
Neutro Abs: 4.9 10*3/uL (ref 1.7–7.7)
Neutrophils Relative %: 65 %
Platelets: 214 10*3/uL (ref 150–400)
RBC: 3.65 MIL/uL — ABNORMAL LOW (ref 3.87–5.11)
RDW: 14.4 % (ref 11.5–15.5)
WBC: 7.5 10*3/uL (ref 4.0–10.5)
nRBC: 0 % (ref 0.0–0.2)

## 2022-06-17 LAB — SARS CORONAVIRUS 2 BY RT PCR: SARS Coronavirus 2 by RT PCR: NEGATIVE

## 2022-06-17 LAB — COMPREHENSIVE METABOLIC PANEL
ALT: 15 U/L (ref 0–44)
AST: 17 U/L (ref 15–41)
Albumin: 3.4 g/dL — ABNORMAL LOW (ref 3.5–5.0)
Alkaline Phosphatase: 62 U/L (ref 38–126)
Anion gap: 8 (ref 5–15)
BUN: 42 mg/dL — ABNORMAL HIGH (ref 8–23)
CO2: 24 mmol/L (ref 22–32)
Calcium: 8.7 mg/dL — ABNORMAL LOW (ref 8.9–10.3)
Chloride: 103 mmol/L (ref 98–111)
Creatinine, Ser: 1.74 mg/dL — ABNORMAL HIGH (ref 0.44–1.00)
GFR, Estimated: 30 mL/min — ABNORMAL LOW (ref >60)
Glucose, Bld: 129 mg/dL — ABNORMAL HIGH (ref 70–99)
Potassium: 4.2 mmol/L (ref 3.5–5.1)
Sodium: 135 mmol/L (ref 135–145)
Total Bilirubin: 0.7 mg/dL (ref 0.3–1.2)
Total Protein: 6.7 g/dL (ref 6.5–8.1)

## 2022-06-17 LAB — TSH: TSH: 3.553 u[IU]/mL (ref 0.350–4.500)

## 2022-06-17 LAB — TROPONIN I (HIGH SENSITIVITY): Troponin I (High Sensitivity): 12 ng/L (ref <18)

## 2022-06-17 LAB — MAGNESIUM: Magnesium: 2.1 mg/dL (ref 1.7–2.4)

## 2022-06-17 LAB — BRAIN NATRIURETIC PEPTIDE: B Natriuretic Peptide: 52 pg/mL (ref 0.0–100.0)

## 2022-06-17 MED ORDER — CLOPIDOGREL BISULFATE 75 MG PO TABS
75.0000 mg | ORAL_TABLET | Freq: Every day | ORAL | Status: DC
  Administered 2022-06-17 – 2022-06-18 (×2): 75 mg via ORAL
  Filled 2022-06-17 (×2): qty 1

## 2022-06-17 MED ORDER — ACETAMINOPHEN 650 MG RE SUPP: 650.0000 mg | Freq: Four times a day (QID) | RECTAL | Status: DC | PRN

## 2022-06-17 MED ORDER — MORPHINE SULFATE (PF) 2 MG/ML IV SOLN: 2.0000 mg | INTRAVENOUS | Status: DC | PRN

## 2022-06-17 MED ORDER — CARVEDILOL 12.5 MG PO TABS: 12.5000 mg | ORAL_TABLET | Freq: Two times a day (BID) | ORAL | Status: DC

## 2022-06-17 MED ORDER — ONDANSETRON HCL 4 MG PO TABS: 4.0000 mg | ORAL_TABLET | Freq: Four times a day (QID) | ORAL | Status: DC | PRN

## 2022-06-17 MED ORDER — CHLORTHALIDONE 25 MG PO TABS: 25.0000 mg | ORAL_TABLET | Freq: Every day | ORAL | Status: DC

## 2022-06-17 MED ORDER — ATORVASTATIN CALCIUM 40 MG PO TABS
40.0000 mg | ORAL_TABLET | Freq: Every day | ORAL | Status: DC
  Administered 2022-06-17 – 2022-06-18 (×2): 40 mg via ORAL
  Filled 2022-06-17 (×2): qty 1

## 2022-06-17 MED ORDER — NIFEDIPINE ER OSMOTIC RELEASE 30 MG PO TB24
90.0000 mg | ORAL_TABLET | Freq: Every day | ORAL | Status: DC
  Administered 2022-06-17 – 2022-06-18 (×2): 90 mg via ORAL
  Filled 2022-06-17 (×2): qty 3

## 2022-06-17 MED ORDER — SPIRONOLACTONE 25 MG PO TABS
25.0000 mg | ORAL_TABLET | Freq: Every day | ORAL | Status: DC
  Administered 2022-06-17 – 2022-06-18 (×2): 25 mg via ORAL
  Filled 2022-06-17 (×2): qty 1

## 2022-06-17 MED ORDER — OXYCODONE HCL 5 MG PO TABS: 5.0000 mg | ORAL_TABLET | ORAL | Status: DC | PRN

## 2022-06-17 MED ORDER — SPIRONOLACTONE 25 MG PO TABS: 25.0000 mg | ORAL_TABLET | Freq: Every day | ORAL | Status: DC

## 2022-06-17 MED ORDER — ACETAMINOPHEN 325 MG PO TABS: 650.0000 mg | ORAL_TABLET | Freq: Four times a day (QID) | ORAL | Status: DC | PRN

## 2022-06-17 MED ORDER — ASPIRIN 81 MG PO CHEW
81.0000 mg | CHEWABLE_TABLET | Freq: Every day | ORAL | Status: DC
  Administered 2022-06-17 – 2022-06-18 (×2): 81 mg via ORAL
  Filled 2022-06-17 (×2): qty 1

## 2022-06-17 MED ORDER — ALBUTEROL SULFATE (2.5 MG/3ML) 0.083% IN NEBU: 3.0000 mL | INHALATION_SOLUTION | RESPIRATORY_TRACT | Status: DC | PRN

## 2022-06-17 MED ORDER — HYDRALAZINE HCL 25 MG PO TABS
100.0000 mg | ORAL_TABLET | Freq: Three times a day (TID) | ORAL | Status: DC
  Filled 2022-06-17: qty 4

## 2022-06-17 MED ORDER — NIFEDIPINE ER OSMOTIC RELEASE 30 MG PO TB24: 60.0000 mg | ORAL_TABLET | ORAL | Status: DC

## 2022-06-17 MED ORDER — HEPARIN SODIUM (PORCINE) 5000 UNIT/ML IJ SOLN
5000.0000 [IU] | Freq: Three times a day (TID) | INTRAMUSCULAR | Status: DC
  Administered 2022-06-17 – 2022-06-18 (×4): 5000 [IU] via SUBCUTANEOUS
  Filled 2022-06-17 (×5): qty 1

## 2022-06-17 MED ORDER — HYDRALAZINE HCL 25 MG PO TABS
50.0000 mg | ORAL_TABLET | Freq: Three times a day (TID) | ORAL | Status: DC
  Administered 2022-06-17 – 2022-06-18 (×2): 50 mg via ORAL
  Filled 2022-06-17 (×3): qty 2

## 2022-06-17 MED ORDER — LABETALOL HCL 200 MG PO TABS
200.0000 mg | ORAL_TABLET | Freq: Two times a day (BID) | ORAL | Status: DC
  Filled 2022-06-17 (×3): qty 1

## 2022-06-17 MED ORDER — HYDRALAZINE HCL 20 MG/ML IJ SOLN: 10.0000 mg | INTRAMUSCULAR | Status: DC | PRN

## 2022-06-17 MED ORDER — ONDANSETRON HCL 4 MG/2ML IJ SOLN: 4.0000 mg | Freq: Four times a day (QID) | INTRAMUSCULAR | Status: DC | PRN

## 2022-06-17 MED ORDER — DOXAZOSIN MESYLATE 4 MG PO TABS: 2.0000 mg | ORAL_TABLET | Freq: Every day | ORAL | Status: DC

## 2022-06-17 NOTE — ED Notes (Signed)
Receiving nurse to call back for report.  

## 2022-06-17 NOTE — ED Notes (Signed)
[place] in room at this time to complete  exam.

## 2022-06-17 NOTE — Assessment & Plan Note (Signed)
Continue statin. 

## 2022-06-17 NOTE — Assessment & Plan Note (Signed)
Continue aspirin and statin. 

## 2022-06-17 NOTE — Assessment & Plan Note (Addendum)
-   Creatinine since 2019 1.5-1.9 Lab Results  Component Value Date   CREATININE 1.74 (H) 06/17/2022   CREATININE 1.67 (H) 06/16/2022   CREATININE 1.90 (H) 06/10/2022   BUN 42 GFR 30 today Estimated Creatinine Clearance: 23 mL/min (A) (by C-G formula based on SCr of 1.74 mg/dL (H)).  -We will attempt to modify and avoid all nephrotoxins

## 2022-06-17 NOTE — H&P (Signed)
History and Physical    Patient: Andrea Ross WUJ:[id number] DOB: [birth year] DOA: 06/16/2022 DOS: the patient was seen and examined on 06/17/2022 PCP: Sharion Balloon, FNP  Patient coming from: Home  Chief Complaint:  Chief Complaint  Patient presents with   Weakness   HPI: Andrea Ross is a 76 y.o. female with medical history significant of resistant hypertension with renal artery stenosis, hyperlipidemia, history of stroke, and more presents the ED with a chief complaint of dyspnea.  Patient reports that she has had dyspnea since she had her stent placed on October 9.  Previous to this that she could walk for about 5 minutes at a good pace before she would have to stop due to claudication.  She is able to speak normally and not become short of breath.  Since then she becomes very short of breath with speaking.  She reports that her blood pressure shoots up when she is speaking as well.  She reports at home her blood pressure is alternating between 93 systolic and 956 systolic.  She denies any headache, chest pain, syncope, palpitations.  Patient is short of breath but she denies any fever, wheezing, cough.  She does report she sometimes aspirates liquids if she is not careful, and will have a cough but she has not had any consistent cough.  She has no history of dyspnea like this, asthma, COPD, use of inhalers.  Eating does not worsen her dyspnea.  It is worse with ambulation/exertion.  Patient has no other complaints at this time.  Patient does not smoke, does not drink. Review of Systems: As mentioned in the history of present illness. All other systems reviewed and are negative. 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   HIATAL HERNIA REPAIR  2004   PERIPHERAL VASCULAR INTERVENTION Left 06/10/2022   Procedure: PERIPHERAL VASCULAR INTERVENTION;  Surgeon: Waynetta Sandy, MD;  Location: [place] [facility];  Service: Cardiovascular;  Laterality: Left;   RENAL ANGIOGRAPHY  06/10/2022   Procedure: RENAL ANGIOGRAPHY;  Surgeon: Waynetta Sandy, MD;  Location: [place] [facility];  Service: Cardiovascular;;   SLT LASER APPLICATION Right 8/46/9629   Procedure: SLT LASER APPLICATION;  Surgeon: Williams Che, MD;  Location: [facility];  Service: Ophthalmology;  Laterality: Right;   UMBILICAL HERNIA REPAIR  1980   Social History:  reports that she quit smoking about 51 years ago. Her smoking use included cigarettes. She started smoking about 51 years ago. She has a 0.08 pack-year smoking history. She has never used smokeless tobacco. She reports that she does not drink alcohol and does not use drugs.  Allergies  Allergen Reactions   Pepperoni [Pickled Meat] Swelling   Penicillins Rash    Family History  Problem Relation Age of Onset   Hypertension Mother    Heart failure Mother    Diabetes Mother    Heart disease Mother    Hypertension Father    Cancer Father    Kidney disease Brother    Hypertension Brother    Stroke Brother     Prior to Admission medications   Medication  Sig Start Date End Date Taking? Authorizing Provider  amLODipine (NORVASC) 10 MG tablet Take 1 tablet (10 mg total) by mouth every evening. Patient not taking: Reported on 06/04/2022 04/01/22   Skeet Latch, MD  aspirin 81 MG chewable tablet Chew 81 mg by mouth daily.    [provider]  atorvastatin (LIPITOR) 40 MG tablet Take 1 tablet (40 mg total) by mouth daily. 03/07/22   Evelina Dun A, FNP  Calcium Carbonate-Vit D-Min (CALCIUM 600+D3 PLUS MINERALS) 600-800 MG-UNIT TABS Take 1 tablet by mouth daily.    [provider]  carvedilol  (COREG) 12.5 MG tablet Take 12.5 mg by mouth 2 (two) times daily. 03/28/22   [provider]  chlorthalidone (HYGROTON) 25 MG tablet Take 25 mg by mouth daily. 04/14/21   [provider]  Cholecalciferol 50 MCG (2000 UT) TABS Take 2,000 Units by mouth daily.    [provider]  clopidogrel (PLAVIX) 75 MG tablet Take 1 tablet (75 mg total) by mouth daily. 06/10/22 06/10/23  Waynetta Sandy, MD  doxazosin (CARDURA) 2 MG tablet Take 1 tablet (2 mg total) by mouth daily. 04/01/22   Skeet Latch, MD  hydrALAZINE (APRESOLINE) 100 MG tablet Take 100 mg by mouth 3 (three) times daily.    [provider]  losartan (COZAAR) 25 MG tablet Take 12.5 mg by mouth daily. 05/02/22   [provider]  multivitamin-iron-minerals-folic acid (CENTRUM) chewable tablet Chew 1 tablet by mouth daily.    [provider]  NIFEdipine (PROCARDIA XL/NIFEDICAL XL) 60 MG 24 hr tablet Take 60 mg by mouth See admin instructions. Take with 30 for a total of 90 mg at bedtime 05/02/22   [provider]  NIFEdipine (PROCARDIA-XL/NIFEDICAL-XL) 30 MG 24 hr tablet Take 30 mg by mouth See admin instructions. Take with 60 mg for a total of 90 mg at bedtime 05/24/22   [provider]  spironolactone (ALDACTONE) 25 MG tablet Take 25 mg by mouth daily. 02/09/20   [provider]    Physical Exam: Vitals:   06/16/22 2230 06/16/22 2244 06/16/22 2245 06/16/22 2246  BP: (!) 189/72 (!) 194/88 (!) 194/88 (!) 194/88  Pulse: 76  63 74  Resp: 17  (!) 23   Temp:      TempSrc:      SpO2: 95%  100%   Weight:      Height:       1.  General: Patient lying supine in bed,  no acute distress   2. Psychiatric: Alert and oriented x 3, mood and behavior normal for situation, pleasant and cooperative with exam   3. Neurologic: Speech and language are normal, face is symmetric, moves all 4 extremities voluntarily, at baseline without acute deficits on limited exam    4. HEENMT:  Head is atraumatic, normocephalic, pupils reactive to light, neck is supple, trachea is midline, mucous membranes are moist   5. Respiratory : Lungs are clear to auscultation bilaterally without wheezing, rhonchi, rales, no cyanosis, no increase in work of breathing or accessory muscle use   6. Cardiovascular : Heart rate normal, rhythm is regular, murmur present, rubs or gallops, no peripheral edema, peripheral pulses palpated   7. Gastrointestinal:  Abdomen is soft, nondistended, nontender to palpation bowel sounds active, no masses or organomegaly palpated   8. Skin:  Skin is warm, dry and intact without rashes, acute lesions, or ulcers on limited exam   9.Musculoskeletal:  No acute deformities or trauma, no asymmetry in tone, no  peripheral edema, peripheral pulses palpated, no tenderness to palpation in the extremities  Data Reviewed: In the ED Temp 97.6, heart rate 55-121, respiratory rate 12-23, blood pressure 189/69-228/93, satting at 100% No leukocytosis with white blood cell count 8.5, hemoglobin 10.8, platelets 240 Chemistry panel reveals an improved creatinine at 1.67 from 1.90 on the day of stent placement UA is not indicative of UTI CT head shows no acute intracranial abnormality Chest x-ray shows no active disease EKG shows a heart rate 65, sinus rhythm, QTc 447 Patient was given albuterol in the ER without improvement She was given Coreg, chlorthalidone, hydralazine, losartan, doxazosin-her home medications for blood pressure 220/93 No repeat blood pressure since then Admission requested for hypertensive crisis and dyspnea  Assessment and Plan: * Generalized weakness - Associated with dyspnea - Ongoing since renal artery stent was placed - No leukocytosis - UA is not negative UTI - Chest x-ray is not indicative of any infection - EKG shows a heart rate of 65, sinus rhythm, QTc 447 - Abnormal troponin to previous blood work - Concern for possible  PE given patient's tachypnea and dyspnea - CTA chest shows no signs of PE, or any acute cardiopulmonary disease - Hemoglobin has come down some from 12.5 baseline to 10.8, if it is continuing to decrease on a.m. labs may consider anemia as a source of dyspnea - COVID-negative -No O2 requirement, will give option for comfort - TSH in the a.m. - Continue to monitor  History of cardioembolic cerebrovascular accident (CVA) - Continue aspirin and statin  Hyperlipidemia - Continue statin  Resistant hypertension -With hypertensive crisis -With renal artery stenosis recently stented on October 9 - Continue Norvasc, Coreg, chlorthalidone, Cardura, losartan, hydralazine, Procardia, Aldactone - As needed IV hydralazine every 4 hours Blood pressure 189/69-228/93 in the ED - CT head shows no acute abnormality - Creatinine is improved compared to the day of the stenting - EKG shows a heart rate of 65, sinus rhythm, QTc 447 -If blood pressure does not come down with p.o. meds, may need to change level of care to stepdown for continuous IV antihypertensive - Continue to monitor      Advance Care Planning:   Code Status: Full Code  Consults: Nephro  Family Communication: Daughter at bedside  Severity of Illness: The appropriate patient status for this patient is INPATIENT. Inpatient status is judged to be reasonable and necessary in order to provide the required intensity of service to ensure the patient's safety. The patient's presenting symptoms, physical exam findings, and initial radiographic and laboratory data in the context of their chronic comorbidities is felt to place them at high risk for further clinical deterioration. Furthermore, it is not anticipated that the patient will be medically stable for discharge from the hospital within 2 midnights of admission.   * I certify that at the point of admission it is my clinical judgment that the patient will require inpatient hospital care  spanning beyond 2 midnights from the point of admission due to high intensity of service, high risk for further deterioration and high frequency of surveillance required.*  Author: Rolla Plate, DO 06/17/2022 1:32 AM  For on call review [email or web address].

## 2022-06-17 NOTE — TOC Initial Note (Signed)
Transition of Care [facility]) - Initial/Assessment Note    Patient Details  Name: Andrea Ross MRN: [id number] Date of Birth: 12-31-1945  Transition of Care [facility]) CM/SW Contact:    Shade Flood, LCSW Phone Number: 06/17/2022, 1:24 PM  Clinical Narrative:                  Pt admitted from home. PT/OT recommending HH at dc. Spoke with pt today to assess and to review dc planning. Per pt, she lives with her sister. Pt states she is interested in a RW at dc. Discussed recommendation for [facility] PT/OT with pt who states that she does not want a referral because her sister is not comfortable with having [facility] come to the house. Offered referral for outpatient therapy and pt declined this as well.   Pt aware that TOC will follow and continue to assist with dc planning.  Expected Discharge Plan: Home/Self Care Barriers to Discharge: Continued Medical Work up   Patient Goals and CMS Choice Patient states their goals for this hospitalization and ongoing recovery are:: go home      Expected Discharge Plan and Services Expected Discharge Plan: Home/Self Care In-house Referral: Clinical Social Work     Living arrangements for the past 2 months: Single Family Home                           HH Arranged: Refused HH          Prior Living Arrangements/Services Living arrangements for the past 2 months: Single Family Home Lives with:: Siblings Patient language and need for interpreter reviewed:: Yes Do you feel safe going back to the place where you live?: Yes      Need for Family Participation in Patient Care: No (Comment) Care giver support system in place?: Yes (comment)   Criminal Activity/Legal Involvement Pertinent to Current Situation/Hospitalization: No - Comment as needed  Activities of Daily Living Home Assistive Devices/Equipment: Shower chair without back, Raised toilet seat with rails, Dentures (specify type), Eyeglasses ADL Screening (condition at time of  admission) Patient's cognitive ability adequate to safely complete daily activities?: Yes Is the patient deaf or have difficulty hearing?: No Does the patient have difficulty seeing, even when wearing glasses/contacts?: No Does the patient have difficulty concentrating, remembering, or making decisions?: Yes Patient able to express need for assistance with ADLs?: Yes Does the patient have difficulty dressing or bathing?: Yes Independently performs ADLs?: No Communication: Independent Dressing (OT): Needs assistance Is this a change from baseline?: Change from baseline, expected to last <3days Grooming: Independent Feeding: Independent Bathing: Needs assistance Is this a change from baseline?: Change from baseline, expected to last <3 days Toileting: Needs assistance Is this a change from baseline?: Change from baseline, expected to last <3 days In/Out Bed: Needs assistance Is this a change from baseline?: Change from baseline, expected to last <3 days Walks in Home: Independent Does the patient have difficulty walking or climbing stairs?: Yes Weakness of Legs: None Weakness of Arms/Hands: None  Permission Sought/Granted                  Emotional Assessment   Attitude/Demeanor/Rapport: Engaged Affect (typically observed): Pleasant Orientation: : Oriented to Self, Oriented to Place, Oriented to  Time, Oriented to Situation Alcohol / Substance Use: Not Applicable Psych Involvement: No (comment)  Admission diagnosis:  Generalized weakness [R53.1] Hypertension, unspecified type [I10] Patient Active Problem List   Diagnosis Date Noted   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, GFR 60-89 ml/min 06/17/2022   Generalized weakness 06/16/2022   Subclavian arterial stenosis (HCC) 04/01/2022   Nodule of left lung 09/13/2020   Osteopenia 09/23/2019   Chronic kidney disease (CKD), stage IV (severe) ([place]) 09/23/2019   History of cardioembolic cerebrovascular accident (CVA) 01/01/2018    Hyperlipidemia 04/11/2015   Vitamin D deficiency 04/11/2015   Resistant hypertension 05/11/2014   PCP:  Sharion Balloon, FNP Pharmacy:   [place], [address] [place] [address] Phone: [phone] Fax: [phone]  [id number] - [place], [address] [place] [address] Phone: [phone] Fax: [phone]     Social Determinants of Health (SDOH) Interventions    Readmission Risk Interventions    06/17/2022    1:23 PM  Readmission Risk Prevention Plan  PCP or Specialist Appt within 5-7 Days Complete  Home Care Screening Patient refused  Medication Review (RN CM) Complete

## 2022-06-17 NOTE — Evaluation (Signed)
Occupational Therapy Evaluation Patient Details Name: Andrea Ross MRN: [id number] DOB: 08-Oct-1945 Today's Date: 06/17/2022   History of Present Illness 77 y.o. F admitted on 06/16/22 due to [facility] and weakness. Pt had a stent placed on 06/10/22 and reports she has been SoB since. PMH significant for resistant hypertension with renal artery stenosis, hyperlipidemia, history of stroke.   Clinical Impression   Pt admitted for concerns listed above. PTA pt reported that she was independent with all ADL's and IADL's. At this time, pt presents with increased weakness, SoB, and balance deficits, as well as decreased activity tolerance. Overall, she is requiring min guard to min A for all functional mobility and ADL's. Recommending HHOT to maximize independence and safety at home. OT will follow acutely.       Recommendations for follow up therapy are one component of a multi-disciplinary discharge planning process, led by the attending physician.  Recommendations may be updated based on patient status, additional functional criteria and insurance authorization.   Follow Up Recommendations  Home health OT    Assistance Recommended at Discharge Intermittent Supervision/Assistance  Patient can return home with the following A little help with walking and/or transfers;A lot of help with bathing/dressing/bathroom;Assistance with cooking/housework;Assist for transportation;Help with stairs or ramp for entrance    Functional Status Assessment  Patient has had a recent decline in their functional status and demonstrates the ability to make significant improvements in function in a reasonable and predictable amount of time.  Equipment Recommendations  Other (comment) (RW)    Recommendations for Other Services       Precautions / Restrictions Precautions Precautions: Fall Restrictions Weight Bearing Restrictions: No      Mobility Bed Mobility Overal bed mobility: Needs Assistance Bed Mobility:  Supine to Sit     Supine to sit: Min guard     General bed mobility comments: Increased time and effort to sit EOB    Transfers Overall transfer level: Needs assistance Equipment used: None Transfers: Sit to/from Stand Sit to Stand: Min guard           General transfer comment: Min guard for standing, min A for ambulation with no RW, min guard for ambulation with RW      Balance Overall balance assessment: Needs assistance Sitting-balance support: Single extremity supported, Feet unsupported Sitting balance-Leahy Scale: Fair     Standing balance support: No upper extremity supported Standing balance-Leahy Scale: Poor                             ADL either performed or assessed with clinical judgement   ADL Overall ADL's : Needs assistance/impaired Eating/Feeding: Set up;Sitting   Grooming: Min guard;Standing   Upper Body Bathing: Min guard;Sitting   Lower Body Bathing: Minimal assistance;Sitting/lateral leans;Sit to/from stand   Upper Body Dressing : Set up;Sitting   Lower Body Dressing: Min guard;Sitting/lateral leans;Sit to/from stand   Toilet Transfer: Min guard;Minimal assistance;Ambulation   Toileting- Clothing Manipulation and Hygiene: Min guard;Sitting/lateral lean;Sit to/from stand       Functional mobility during ADLs: Min guard;Minimal assistance;Rolling walker (2 wheels)       Vision Baseline Vision/History: 1 Wears glasses Ability to See in Adequate Light: 0 Adequate Patient Visual Report: No change from baseline Vision Assessment?: No apparent visual deficits     Perception     Praxis      Pertinent Vitals/Pain Pain Assessment Pain Assessment: No/denies pain     Hand Dominance Right  Extremity/Trunk Assessment Upper Extremity Assessment Upper Extremity Assessment: Generalized weakness   Lower Extremity Assessment Lower Extremity Assessment: Defer to PT evaluation   Cervical / Trunk Assessment Cervical / Trunk  Assessment: Kyphotic   Communication Communication Communication: No difficulties   Cognition Arousal/Alertness: Awake/alert Behavior During Therapy: WFL for tasks assessed/performed Overall Cognitive Status: Within Functional Limits for tasks assessed                                       General Comments  VSS on RA, pt with noted dyspnea    Exercises     Shoulder Instructions      Home Living Family/patient expects to be discharged to:: Private residence Living Arrangements: Other relatives Available Help at Discharge: Family;Available PRN/intermittently Type of Home: House Home Access: Stairs to enter [employer] of Steps: 4 steps Entrance Stairs-Rails: Left Home Layout: One level     Bathroom Shower/Tub: Teacher, early years/pre: Handicapped height     Home Equipment: Shower seat;BSC/3in1;Cane - single point;Wheelchair - manual          Prior Functioning/Environment Prior Level of Function : Independent/Modified Independent             Mobility Comments: Reports typically no AD for mobility, however has been using cane recently. ADLs Comments: Reports Indep        OT Problem List: Decreased strength;Decreased activity tolerance;Impaired balance (sitting and/or standing);Decreased safety awareness;Decreased knowledge of use of DME or AE;Cardiopulmonary status limiting activity      OT Treatment/Interventions: Self-care/ADL training;Therapeutic exercise;Energy conservation;DME and/or AE instruction;Therapeutic activities;Patient/family education;Balance training    OT Goals(Current goals can be found in the care plan section) Acute Rehab OT Goals Patient Stated Goal: To go home OT Goal Formulation: With patient Time For Goal Achievement: 07/01/22 Potential to Achieve Goals: Good ADL Goals Pt Will Perform Grooming: with modified independence;standing Pt Will Perform Lower Body Bathing: with modified  independence;sitting/lateral leans;sit to/from stand Pt Will Perform Lower Body Dressing: with modified independence;sit to/from stand;sitting/lateral leans Pt Will Transfer to Toilet: with modified independence;ambulating Pt Will Perform Toileting - Clothing Manipulation and hygiene: with modified independence;sitting/lateral leans;sit to/from stand  OT Frequency: Min 1X/week    Co-evaluation              AM-PAC OT "6 Clicks" Daily Activity     Outcome Measure Help from another person eating meals?: A Little Help from another person taking care of personal grooming?: A Little Help from another person toileting, which includes using toliet, bedpan, or urinal?: A Little Help from another person bathing (including washing, rinsing, drying)?: A Little Help from another person to put on and taking off regular upper body clothing?: A Little Help from another person to put on and taking off regular lower body clothing?: A Little 6 Click Score: 18   End of Session Equipment Utilized During Treatment: Gait belt;Rolling walker (2 wheels) Nurse Communication: Mobility status  Activity Tolerance: Patient tolerated treatment well Patient left: in chair (In transport chair transferring to 300)  OT Visit Diagnosis: Unsteadiness on feet (R26.81);Other abnormalities of gait and mobility (R26.89);Muscle weakness (generalized) (M62.81)                Time: 6568-1275 OT Time Calculation (min): 17 min Charges:  OT General Charges $OT Visit: 1 Visit OT Evaluation $OT Eval Low Complexity: [place], OTR/L [facility]  Masaye Gatchalian Elane Haliegh Khurana 06/17/2022, 11:23 AM

## 2022-06-17 NOTE — Progress Notes (Signed)
PROGRESS NOTE    Patient: Andrea Ross                            PCP: Sharion Balloon, FNP                    DOB: 12/12/1945            DOA: 06/16/2022 RWE:[id number]             DOS: 06/17/2022, 12:09 PM   LOS: 1 day   Date of Service: The patient was seen and examined on 06/17/2022  Subjective:   The patient was seen and examined this morning. Stable, denies any chest pain, shortness of breath with exertion Denies of having any lower extremity swelling Satting 100% on room air   Brief Narrative:   Andrea Ross is a 76 y.o. female with medical history significant of resistant HLD, HTN with renal artery stenosis, history of stroke, and more presents the ED with a chief complaint of dyspnea.   Patient reports that she has had dyspnea since she had her stent placed on October 9. Previous to this that she could walk for about 5 minutes at a good pace before she would have to stop due to claudication.  She is able to speak normally and not become short of breath.  Since then she becomes very short of breath with speaking.  She reports that her blood pressure shoots up when she is speaking as well.  She reports at home her blood pressure is alternating between 93 systolic and 619 systolic.  She denies any headache, chest pain, syncope, palpitations.  Patient is short of breath but she denies any fever, wheezing, cough.  She does report she sometimes aspirates liquids if she is not careful, and will have a cough but she has not had any consistent cough.  She has no history of dyspnea like this, asthma, COPD, use of inhalers.  Eating does not worsen her dyspnea.  It is worse with ambulation/exertion.  Patient has no other complaints at this time.   Patient does not smoke, does not drink.  ED Temp 97.6, heart rate 55-121, respiratory rate 12-23, blood pressure 189/69-228/93, satting at 100% No leukocytosis with white blood cell count 8.5, hemoglobin 10.8, platelets 240 CMP: creatinine at  1.67 from 1.90 on the day of stent placement UA is not indicative of UTI CT head shows no acute intracranial abnormality Chest x-ray shows no active disease EKG shows a heart rate 65, sinus rhythm, QTc 447 Patient was given albuterol in the ER without improvement She was given Coreg, chlorthalidone, hydralazine, losartan, doxazosin-her home medications for blood pressure 220/93 No repeat blood pressure since then Admission requested for hypertensive crisis and dyspnea    Assessment & Plan:   Principal Problem:   Generalized weakness Active Problems:   Resistant hypertension   Hyperlipidemia   History of cardioembolic cerebrovascular accident (CVA)   CKD (chronic kidney disease) stage 2, GFR 60-89 ml/min     Assessment and Plan: * Generalized weakness -Obtaining PT OT for evaluation recommendation -Optimizing medical management for better blood pressure control  - Associated with dyspnea - Ongoing since renal artery stent was placed - No leukocytosis - UA is not negative UTI - Chest x-ray is not indicative of any infection - EKG shows a heart rate of 65, sinus rhythm, QTc 447 - Abnormal troponin to previous blood work - Concern for possible  PE given patient's tachypnea and dyspnea - CTA chest shows no signs of PE, or any acute cardiopulmonary disease - Hemoglobin has come down some from 12.5 baseline to 10.8, if it is continuing to decrease on a.m. labs may consider anemia as a source of dyspnea -No O2 requirement, will give option for comfort - TSH:  - Continue to monitor  CKD (chronic kidney disease) stage 2, GFR 60-89 ml/min - Creatinine since 2019 1.5-1.9 Lab Results  Component Value Date   CREATININE 1.74 (H) 06/17/2022   CREATININE 1.67 (H) 06/16/2022   CREATININE 1.90 (H) 06/10/2022   BUN 42 GFR 30 today Estimated Creatinine Clearance: 23 mL/min (A) (by C-G formula based on SCr of 1.74 mg/dL (H)).  -We will attempt to modify and avoid all  nephrotoxins   History of cardioembolic cerebrovascular accident (CVA) - Continue aspirin and statin  Hyperlipidemia - Continue statin  Resistant hypertension -With hypertensive crisis -With renal artery stenosis recently stented on October 9 Ordered renal artery ultrasound- reported cannot be obtained at [facility] Therefore obtaining regular renal ultrasound    - Continue Norvasc, Coreg, chlorthalidone, Cardura, losartan, hydralazine, Procardia, Aldactone -Substituting Coreg for labetalol 200 mg twice daily-to obtain a better BP control -Due to elevated creatinine anticipating lowering losartan chlorthalidone dosage  - As needed IV hydralazine every 4 hours Blood pressure 189/69-228/93 in the ED>>> blood pressure 193/67 now  - CT head shows no acute abnormality - Creatinine is improved compared to the day of the stenting - EKG shows a heart rate of 65, sinus rhythm, QTc 447 -If blood pressure does not come down with p.o. meds, may need to change level of care to stepdown for continuous IV antihypertensive - Continue to monitor   ----------------------------------------------------------------------------------------------------------------------------------------------- Nutritional status:  The patient's BMI is: Body mass index is 23.9 kg/m. I agree with the assessment and plan as outlined ------------------------------------------------------------------------------------------------------------------------------------------------  DVT prophylaxis:  heparin injection 5,000 Units Start: 06/17/22 0600 SCDs Start: 06/17/22 0121   Code Status:   Code Status: Full Code  Family Communication: No family member present at bedside- attempt will be made to update daily The above findings and plan of care has been discussed with patient (and family)  in detail,  they expressed understanding and agreement of above. -Advance care planning has been discussed.   Admission status:    Status is: Inpatient Remains inpatient appropriate because: Remain in hypertensive crisis, with AKI, medication adjustment, needing cardiac work-up     Procedures:   No admission procedures for hospital encounter.   Antimicrobials:  Anti-infectives (From admission, onward)    None        Medication:   aspirin  81 mg Oral Daily   atorvastatin  40 mg Oral Daily   chlorthalidone  25 mg Oral Daily   clopidogrel  75 mg Oral Daily   doxazosin  2 mg Oral Daily   heparin  5,000 Units Subcutaneous Q8H   hydrALAZINE  100 mg Oral Q8H   labetalol  200 mg Oral BID   losartan  12.5 mg Oral Daily   NIFEdipine  90 mg Oral Daily   spironolactone  25 mg Oral Daily    acetaminophen **OR** acetaminophen, albuterol, hydrALAZINE, morphine injection, ondansetron **OR** ondansetron (ZOFRAN) IV, oxyCODONE   Objective:   Vitals:   06/17/22 0730 06/17/22 0800 06/17/22 0813 06/17/22 0918  BP: (!) 147/61 (!) 159/67  (!) 193/67  Pulse: 62 63  (!) 57  Resp: 17 16  18   Temp:   98.2 F (36.8  C) 98.4 F (36.9 C)  TempSrc:   Oral   SpO2: 96% 96%  100%  Weight:      Height:        Intake/Output Summary (Last 24 hours) at 06/17/2022 1209 Last data filed at 06/17/2022 1005 Gross per 24 hour  Intake 860 ml  Output --  Net 860 ml   Filed Weights   06/16/22 1918  Weight: 63.2 kg     Examination:   Physical Exam  Constitution:  Alert, cooperative, no distress,  Appears calm and comfortable  Psychiatric:   Normal and stable mood and affect, cognition intact,   HEENT:        Normocephalic, PERRL, otherwise with in Normal limits  Chest:         Chest symmetric Cardio vascular:  S1/S2, RRR, No murmure, No Rubs or Gallops  pulmonary: Clear to auscultation bilaterally, respirations unlabored, negative wheezes / crackles Abdomen: Soft, non-tender, non-distended, bowel sounds,no masses, no organomegaly Muscular skeletal: Limited exam - in bed, able to move all 4 extremities,   Neuro:  CNII-XII intact. , normal motor and sensation, reflexes intact  Extremities: No pitting edema lower extremities, +2 pulses  Skin: Dry, warm to touch, negative for any Rashes, No open wounds Wounds: per nursing documentation   ------------------------------------------------------------------------------------------------------------------------------------------    LABs:     Latest Ref Rng & Units 06/17/2022    5:03 AM 06/16/2022    7:54 PM 06/10/2022    7:43 AM  CBC  WBC 4.0 - 10.5 K/uL 7.5  8.5    Hemoglobin 12.0 - 15.0 g/dL 10.3  10.8  11.2   Hematocrit 36.0 - 46.0 % 31.1  32.3  33.0   Platelets 150 - 400 K/uL 214  240        Latest Ref Rng & Units 06/17/2022    5:03 AM 06/16/2022    7:54 PM 06/10/2022    7:43 AM  CMP  Glucose 70 - 99 mg/dL 129  96  88   BUN 8 - 23 mg/dL 42  45  39   Creatinine 0.44 - 1.00 mg/dL 1.74  1.67  1.90   Sodium 135 - 145 mmol/L 135  135  139   Potassium 3.5 - 5.1 mmol/L 4.2  4.6  4.2   Chloride 98 - 111 mmol/L 103  99  108   CO2 22 - 32 mmol/L 24  23    Calcium 8.9 - 10.3 mg/dL 8.7  9.1    Total Protein 6.5 - 8.1 g/dL 6.7     Total Bilirubin 0.3 - 1.2 mg/dL 0.7     Alkaline Phos 38 - 126 U/L 62     AST 15 - 41 U/L 17     ALT 0 - 44 U/L 15          Micro Results Recent Results (from the past 240 hour(s))  SARS Coronavirus 2 by RT PCR (hospital order, performed in [place] [facility] lab) *cepheid single result test* Anterior Nasal Swab     Status: None   Collection Time: 06/16/22 11:36 PM   Specimen: Anterior Nasal Swab  Result Value Ref Range Status   SARS Coronavirus 2 by RT PCR NEGATIVE NEGATIVE Final    Comment: (NOTE) SARS-CoV-2 target nucleic acids are NOT DETECTED.  The SARS-CoV-2 RNA is generally detectable in upper and lower respiratory specimens during the acute phase of infection. The lowest concentration of SARS-CoV-2 viral copies this assay can detect is 250 copies / mL. A negative  result does not preclude SARS-CoV-2  infection and should not be used as the sole basis for treatment or other patient management decisions.  A negative result may occur with improper specimen collection / handling, submission of specimen other than nasopharyngeal swab, presence of viral mutation(s) within the areas targeted by this assay, and inadequate number of viral copies (<250 copies / mL). A negative result must be combined with clinical observations, patient history, and epidemiological information.  Fact Sheet for Patients:   https://www.patel.info/  Fact Sheet for Healthcare Providers: https://hall.com/  This test is not yet approved or  cleared by the [place] FDA and has been authorized for detection and/or diagnosis of SARS-CoV-2 by FDA under an Emergency Use Authorization (EUA).  This EUA will remain in effect (meaning this test can be used) for the duration of the COVID-19 declaration under Section 564(b)(1) of the Act, 21 U.S.C. section 360bbb-3(b)(1), unless the authorization is terminated or revoked sooner.  Performed at [facility], [address] [place], [place] [address]     Radiology Reports US RENAL  Result Date: 06/17/2022 CLINICAL DATA:  [id number] AKI (acute kidney injury) [facility]) [id number] Hypertensive emergency [phone] EXAM: RENAL / URINARY TRACT ULTRASOUND COMPLETE COMPARISON:  CT 03/10/2019 FINDINGS: Right Kidney: Renal measurements: 7.1 x 2.9 x 4.0 cm = volume: 41.7 mL. Echogenicity within normal limits. No mass or hydronephrosis visualized. Left Kidney: Renal measurements: 6.1 x 3.9 x 3.8 cm = volume: 46.9 mL. Echogenicity within normal limits. No mass. Extrarenal pelvis with mild pelvic fullness. Bladder: Moderately distended but otherwise unremarkable. Bilateral ureteral jets are seen. Other: None. IMPRESSION: Left extrarenal pelvis with mild pelvic fullness. No right hydronephrosis. Moderate bladder distension. Electronically Signed   By:  Maurine Simmering M.D.   On: 06/17/2022 08:56   CT Angio Chest Pulmonary Embolism (PE) W or WO Contrast  Result Date: 06/17/2022 CLINICAL DATA:  Weakness, shortness of breath, positive D-dimer EXAM: CT ANGIOGRAPHY CHEST WITH CONTRAST TECHNIQUE: Multidetector CT imaging of the chest was performed using the standard protocol during bolus administration of intravenous contrast. Multiplanar CT image reconstructions and MIPs were obtained to evaluate the vascular anatomy. RADIATION DOSE REDUCTION: This exam was performed according to the departmental dose-optimization program which includes automated exposure control, adjustment of the mA and/or kV according to patient size and/or use of iterative reconstruction technique. CONTRAST:  84mL OMNIPAQUE IOHEXOL 350 MG/ML SOLN COMPARISON:  Chest radiograph dated 06/16/2022 FINDINGS: Cardiovascular: Satisfactory opacification of the bilateral pulmonary arteries to the lobar level. No evidence of pulmonary embolism. Although not tailored for evaluation of the thoracic aorta, there is no evidence thoracic aortic aneurysm or dissection. Atherosclerotic calcifications of the arch. The heart is normal in size.  Trace anterior pericardial fluid. Three vessel coronary sclerosis. Mediastinum/Nodes: No suspicious mediastinal lymphadenopathy. Visualized thyroid is unremarkable. Lungs/Pleura: Mild centrilobular emphysematous changes, upper lung predominant. No focal consolidation. No suspicious pulmonary nodules. No pleural effusion or pneumothorax. Upper Abdomen: Visualized upper abdomen is grossly unremarkable, noting vascular calcifications. Musculoskeletal: Visualized osseous structures are within normal limits. Review of the MIP images confirms the above findings. IMPRESSION: No evidence of pulmonary embolism. No evidence of acute cardiopulmonary disease. Aortic Atherosclerosis (ICD10-I70.0) and Emphysema (ICD10-J43.9). Electronically Signed   By: Julian Hy M.D.   On:  06/17/2022 00:35   CT Head Wo Contrast  Result Date: 06/16/2022 CLINICAL DATA:  Mental status change, unknown cause EXAM: CT HEAD WITHOUT CONTRAST TECHNIQUE: Contiguous axial images were obtained from the base of the skull through the vertex without  intravenous contrast. RADIATION DOSE REDUCTION: This exam was performed according to the departmental dose-optimization program which includes automated exposure control, adjustment of the mA and/or kV according to patient size and/or use of iterative reconstruction technique. COMPARISON:  None Available. FINDINGS: Brain: No acute intracranial abnormality. Specifically, no hemorrhage, hydrocephalus, mass lesion, acute infarction, or significant intracranial injury. Old left cerebellar infarct. Vascular: No hyperdense vessel or unexpected calcification. Skull: No acute calvarial abnormality. Sinuses/Orbits: No acute findings Other: None IMPRESSION: No acute intracranial abnormality. Electronically Signed   By: Rolm Baptise M.D.   On: 06/16/2022 22:49   DG Chest 2 View  Result Date: 06/16/2022 CLINICAL DATA:  Shortness of breath. EXAM: CHEST - 2 VIEW COMPARISON:  None Available. FINDINGS: The heart size and mediastinal contours are within normal limits. Both lungs are clear. The visualized skeletal structures are unremarkable. IMPRESSION: No active cardiopulmonary disease. Electronically Signed   By: Ronney Asters M.D.   On: 06/16/2022 19:51    SIGNED: Deatra James, MD, FHM. [facility],  Pager (please use [email or web address] to page/text) Please use Epic Secure Chat for non-urgent communication (7AM-7PM)  If 7PM-7AM, please contact night-coverage [email or web address], 06/17/2022, 12:09 PM

## 2022-06-17 NOTE — Plan of Care (Signed)
  Problem: Acute Rehab PT Goals(only PT should resolve) Goal: Pt Will Go Supine/Side To Sit Outcome: Progressing Flowsheets (Taken 06/17/2022 1408) Pt will go Supine/Side to Sit: with modified independence Goal: Patient Will Transfer Sit To/From Stand Outcome: Progressing Flowsheets (Taken 06/17/2022 1408) Patient will transfer sit to/from stand: with modified independence Goal: Pt Will Transfer Bed To Chair/Chair To Bed Outcome: Progressing Flowsheets (Taken 06/17/2022 1408) Pt will Transfer Bed to Chair/Chair to Bed: with modified independence Goal: Pt Will Ambulate Outcome: Progressing Flowsheets (Taken 06/17/2022 1408) Pt will Ambulate:  with modified independence  > 125 feet  with least restrictive assistive device   [employer], SPT

## 2022-06-17 NOTE — Hospital Course (Addendum)
Andrea Ross is a 76 y.o. female with medical history significant of resistant HLD, HTN with renal artery stenosis, history of stroke, and more presents the ED with a chief complaint of dyspnea.   Patient reports that she has had dyspnea since she had her stent placed on October 9. Previous to this that she could walk for about 5 minutes at a good pace before she would have to stop due to claudication.  She is able to speak normally and not become short of breath.  Since then she becomes very short of breath with speaking.  She reports that her blood pressure shoots up when she is speaking as well.  She reports at home her blood pressure is alternating between 93 systolic and 191 systolic.  She denies any headache, chest pain, syncope, palpitations.  Patient is short of breath but she denies any fever, wheezing, cough.  She does report she sometimes aspirates liquids if she is not careful, and will have a cough but she has not had any consistent cough.  She has no history of dyspnea like this, asthma, COPD, use of inhalers.  Eating does not worsen her dyspnea.  It is worse with ambulation/exertion.  Patient has no other complaints at this time.   Patient does not smoke, does not drink.  ED Temp 97.6, heart rate 55-121, respiratory rate 12-23, blood pressure 189/69-228/93, satting at 100% No leukocytosis with white blood cell count 8.5, hemoglobin 10.8, platelets 240 CMP: creatinine at 1.67 from 1.90 on the day of stent placement UA is not indicative of UTI CT head shows no acute intracranial abnormality Chest x-ray shows no active disease EKG shows a heart rate 65, sinus rhythm, QTc 447 Patient was given albuterol in the ER without improvement She was given Coreg, chlorthalidone, hydralazine, losartan, doxazosin-her home medications for blood pressure 220/93 No repeat blood pressure since then Admission requested for hypertensive crisis and dyspnea

## 2022-06-17 NOTE — Assessment & Plan Note (Addendum)
Pt/ot  -Optimizing medical management for better blood pressure control  - Associated with dyspnea - Ongoing since renal artery stent was placed - No leukocytosis - UA is not negative UTI - Chest x-ray is not indicative of any infection - EKG shows a heart rate of 65, sinus rhythm, QTc 447 - Abnormal troponin to previous blood work - Concern for possible PE given patient's tachypnea and dyspnea - CTA chest shows no signs of PE, or any acute cardiopulmonary disease - Hemoglobin has come down some from 12.5 baseline to 10.8, if it is continuing to decrease on a.m. labs may consider anemia as a source of dyspnea -No O2 requirement, will give option for comfort - TSH: WNL  - 2D echocardiogram: Revealing grade 1 diastolic dysfunction, ejection fraction 60-65%

## 2022-06-17 NOTE — Evaluation (Signed)
Physical Therapy Evaluation Patient Details Name: Andrea Ross MRN: [id number] DOB: 24-Jul-1946 Today's Date: 06/17/2022  History of Present Illness  Andrea Ross is a 76 y.o. female with medical history significant of resistant hypertension with renal artery stenosis, hyperlipidemia, history of stroke, and more presents the ED with a chief complaint of dyspnea.  Patient reports that she has had dyspnea since she had her stent placed on October 9.  Previous to this that she could walk for about 5 minutes at a good pace before she would have to stop due to claudication.  She is able to speak normally and not become short of breath.  Since then she becomes very short of breath with speaking.  She reports that her blood pressure shoots up when she is speaking as well.  She reports at home her blood pressure is alternating between 93 systolic and 588 systolic.  She denies any headache, chest pain, syncope, palpitations.  Patient is short of breath but she denies any fever, wheezing, cough.  She does report she sometimes aspirates liquids if she is not careful, and will have a cough but she has not had any consistent cough.  She has no history of dyspnea like this, asthma, COPD, use of inhalers.  Eating does not worsen her dyspnea.  It is worse with ambulation/exertion.  Patient has no other complaints at this time.   Clinical Impression  Patient with slightly labored movement for sitting up at bedside requiring use of bed rails. Patient able to take a few unsteady steps without AD in room, switched to RW for hallway ambulation with good carryover and increased safety/stability. Patient transferred to transfer chair in hallway for transport to medical floor by nursing staff. Patient will benefit from continued skilled physical therapy in hospital and recommended venue below to increase strength, balance, endurance for safe ADLs and gait.     Recommendations for follow up therapy are one component of a  multi-disciplinary discharge planning process, led by the attending physician.  Recommendations may be updated based on patient status, additional functional criteria and insurance authorization.  Follow Up Recommendations Home health PT      Assistance Recommended at Discharge Set up Supervision/Assistance  Patient can return home with the following  A little help with walking and/or transfers;A little help with bathing/dressing/bathroom;Help with stairs or ramp for entrance;Assistance with cooking/housework    Equipment Recommendations Rolling walker (2 wheels)  Recommendations for Other Services       Functional Status Assessment Patient has had a recent decline in their functional status and demonstrates the ability to make significant improvements in function in a reasonable and predictable amount of time.     Precautions / Restrictions Precautions Precautions: Fall Restrictions Weight Bearing Restrictions: No      Mobility  Bed Mobility Overal bed mobility: Needs Assistance Bed Mobility: Supine to Sit     Supine to sit: Supervision     General bed mobility comments: increased time and slightly labored movement, use of bedrails    Transfers Overall transfer level: Needs assistance Equipment used: Rolling walker (2 wheels) Transfers: Sit to/from Stand, Bed to chair/wheelchair/BSC Sit to Stand: Supervision, Min guard   Step pivot transfers: Supervision, Min guard       General transfer comment: patient transferred to transfer chair after ambulation with RW with supervision/CGA    Ambulation/Gait Ambulation/Gait assistance: Supervision Gait Distance (Feet): 50 Feet Assistive device: Rolling walker (2 wheels) Gait Pattern/deviations: Decreased step length - left, Decreased step length -  right, Decreased stride length Gait velocity: decreased     General Gait Details: Patient holding onto furniture in room and unsteady on feet without RW, increased  safety/stability with RW for hallway ambulation  Stairs            Wheelchair Mobility    Modified Rankin (Stroke Patients Only)       Balance Overall balance assessment: Needs assistance Sitting-balance support: Feet unsupported, Bilateral upper extremity supported Sitting balance-Leahy Scale: Good Sitting balance - Comments: fair/good seated EOB   Standing balance support: No upper extremity supported, Bilateral upper extremity supported, During functional activity Standing balance-Leahy Scale: Fair Standing balance comment: fair w/o AD, fair/good with RW                             Pertinent Vitals/Pain Pain Assessment Pain Assessment: No/denies pain    Home Living Family/patient expects to be discharged to:: Private residence Living Arrangements: Other relatives (sister) Available Help at Discharge: Family;Available PRN/intermittently Type of Home: House Home Access: Stairs to enter Entrance Stairs-Rails: Left Entrance Stairs-Number of Steps: 4   Home Layout: One level Home Equipment: Shower seat;BSC/3in1;Cane - single point;Wheelchair - manual      Prior Function Prior Level of Function : Independent/Modified Independent             Mobility Comments: community ambulator, does not drive, does not use AD at baseline except for use of SPC this past week after stent placed in leg ADLs Comments: Independent     Hand Dominance   Dominant Hand: Right    Extremity/Trunk Assessment   Upper Extremity Assessment Upper Extremity Assessment: Defer to OT evaluation    Lower Extremity Assessment Lower Extremity Assessment: Generalized weakness    Cervical / Trunk Assessment Cervical / Trunk Assessment: Kyphotic  Communication   Communication: No difficulties  Cognition Arousal/Alertness: Awake/alert Behavior During Therapy: WFL for tasks assessed/performed Overall Cognitive Status: Within Functional Limits for tasks assessed                                           General Comments General comments (skin integrity, edema, etc.): VSS on RA, pt with noted dyspnea    Exercises     Assessment/Plan    PT Assessment Patient needs continued PT services  PT Problem List Decreased strength;Decreased activity tolerance;Decreased balance;Decreased mobility       PT Treatment Interventions DME instruction;Balance training;Gait training;Stair training;Functional mobility training;Patient/family education;Therapeutic activities;Therapeutic exercise    PT Goals (Current goals can be found in the Care Plan section)  Acute Rehab PT Goals Patient Stated Goal: return home with family to assist PRN PT Goal Formulation: With patient Time For Goal Achievement: 07/01/22 Potential to Achieve Goals: Good    Frequency Min 3X/week     Co-evaluation PT/OT/SLP Co-Evaluation/Treatment: Yes Reason for Co-Treatment: To address functional/ADL transfers PT goals addressed during session: Mobility/safety with mobility;Balance;Proper use of DME         AM-PAC PT "6 Clicks" Mobility  Outcome Measure Help needed turning from your back to your side while in a flat bed without using bedrails?: A Little Help needed moving from lying on your back to sitting on the side of a flat bed without using bedrails?: A Little Help needed moving to and from a bed to a chair (including a wheelchair)?: A Little Help needed standing  up from a chair using your arms (e.g., wheelchair or bedside chair)?: A Little Help needed to walk in hospital room?: A Little Help needed climbing 3-5 steps with a railing? : A Lot 6 Click Score: 17    End of Session Equipment Utilized During Treatment: Gait belt Activity Tolerance: Patient tolerated treatment well;Patient limited by fatigue Patient left: in chair (in transfer chair (to be transported to main floor by nursing)) Nurse Communication: Mobility status PT Visit Diagnosis: Unsteadiness on feet  (R26.81);Other abnormalities of gait and mobility (R26.89);Muscle weakness (generalized) (M62.81)    Time: 0037-9444 PT Time Calculation (min) (ACUTE ONLY): 30 min   Charges:   PT Evaluation $PT Eval Moderate Complexity: 1 Mod PT Treatments $Therapeutic Activity: 23-37 mins        Zigmund Gottron, SPT

## 2022-06-17 NOTE — Assessment & Plan Note (Addendum)
-  With hypertensive crisis -With renal artery stenosis recently stented on October 9 Ordered renal artery ultrasound- reported cannot be obtained at [facility] Therefore obtaining regular renal ultrasound    - Continue Norvasc, Coreg, chlorthalidone, Cardura, losartan, hydralazine, Procardia, Aldactone -Substituting Coreg for labetalol 200 mg twice daily-to obtain a better BP control -Due to elevated creatinine anticipating lowering losartan chlorthalidone dosage  - As needed IV hydralazine every 4 hours Blood pressure 189/69-228/93 in the ED>>> blood pressure 193/67 now  - CT head shows no acute abnormality - Creatinine is improved compared to the day of the stenting - EKG shows a heart rate of 65, sinus rhythm, QTc 447 -If blood pressure does not come down with p.o. meds, may need to change level of care to stepdown for continuous IV antihypertensive - Continue to monitor

## 2022-06-18 ENCOUNTER — Other Ambulatory Visit (HOSPITAL_COMMUNITY): Payer: POS

## 2022-06-18 ENCOUNTER — Inpatient Hospital Stay (HOSPITAL_COMMUNITY): Payer: POS

## 2022-06-18 DIAGNOSIS — I5030 Unspecified diastolic (congestive) heart failure: Secondary | ICD-10-CM

## 2022-06-18 DIAGNOSIS — R531 Weakness: Secondary | ICD-10-CM | POA: Diagnosis not present

## 2022-06-18 LAB — ECHOCARDIOGRAM COMPLETE
AR max vel: 1.97 cm2
AV Area VTI: 2 cm2
AV Area mean vel: 2.09 cm2
AV Mean grad: 5 mmHg
AV Peak grad: 12.4 mmHg
Ao pk vel: 1.76 m/s
Area-P 1/2: 2.36 cm2
Height: 64 in
MV VTI: 2.97 cm2
P 1/2 time: 622 msec
S' Lateral: 3 cm
Weight: 2227.53 oz

## 2022-06-18 LAB — BASIC METABOLIC PANEL
Anion gap: 10 (ref 5–15)
BUN: 40 mg/dL — ABNORMAL HIGH (ref 8–23)
CO2: 23 mmol/L (ref 22–32)
Calcium: 9 mg/dL (ref 8.9–10.3)
Chloride: 103 mmol/L (ref 98–111)
Creatinine, Ser: 1.7 mg/dL — ABNORMAL HIGH (ref 0.44–1.00)
GFR, Estimated: 31 mL/min — ABNORMAL LOW (ref >60)
Glucose, Bld: 95 mg/dL (ref 70–99)
Potassium: 4.1 mmol/L (ref 3.5–5.1)
Sodium: 136 mmol/L (ref 135–145)

## 2022-06-18 MED ORDER — FUROSEMIDE 20 MG PO TABS: 20.0000 mg | ORAL_TABLET | Freq: Every day | ORAL | 11 refills | Status: DC

## 2022-06-18 MED ORDER — LABETALOL HCL 200 MG PO TABS: 200.0000 mg | ORAL_TABLET | Freq: Two times a day (BID) | ORAL | 2 refills | Status: DC

## 2022-06-18 NOTE — Discharge Summary (Addendum)
Physician Discharge Summary   Patient: Andrea Ross: [id number] DOB: 06-07-1946  Admit date:     06/16/2022  Discharge date: 06/18/22  Discharge Physician: Andrea Ross   PCP: Andrea Balloon, FNP   Recommendations at discharge:    Follow-up with nephrologist closely Your current BP medication continued need to be adjusted for better BP control Check your blood at home few times a day -keep a log of your BP measures at home-discussed with PCP and primary nephrologist for adjustment of current meds  Discharge Diagnoses: Principal Problem:   Generalized weakness Active Problems:   Resistant hypertension   Hyperlipidemia   History of cardioembolic cerebrovascular accident (CVA)   CKD (chronic kidney disease) stage 2, GFR 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 [facility] Course: Andrea Ross is a 76 y.o. female with medical history significant of resistant HLD, HTN with renal artery stenosis, history of stroke, and more presents the ED with a chief complaint of dyspnea.   Patient reports that she has had dyspnea since she had her stent placed on October 9. Previous to this that she could walk for about 5 minutes at a good pace before she would have to stop due to claudication.  She is able to speak normally and not become short of breath.  Since then she becomes very short of breath with speaking.  She reports that her blood pressure shoots up when she is speaking as well.  She reports at home her blood pressure is alternating between 93 systolic and 211 systolic.  She denies any headache, chest pain, syncope, palpitations.  Patient is short of breath but she denies any fever, wheezing, cough.  She does report she sometimes aspirates liquids if she is not careful, and will have a cough but she has not had any consistent cough.  She has no history of dyspnea like this, asthma, COPD, use of  inhalers.  Eating does not worsen her dyspnea.  It is worse with ambulation/exertion.  Patient has no other complaints at this time.   Patient does not smoke, does not drink.  ED Temp 97.6, heart rate 55-121, respiratory rate 12-23, blood pressure 189/69-228/93, satting at 100% No leukocytosis with white blood cell count 8.5, hemoglobin 10.8, platelets 240 CMP: creatinine at 1.67 from 1.90 on the day of stent placement UA is not indicative of UTI CT head shows no acute intracranial abnormality Chest x-ray shows no active disease EKG shows a heart rate 65, sinus rhythm, QTc 447 Patient was given albuterol in the ER without improvement She was given Coreg, chlorthalidone, hydralazine, losartan, doxazosin-her home medications for blood pressure 220/93 No repeat blood pressure since then Admission requested for hypertensive crisis and dyspnea  Assessment and Plan: * Generalized weakness Pt/ot  -Optimizing medical management for better blood pressure control  - Associated with dyspnea - Ongoing since renal artery stent was placed - No leukocytosis - UA is not negative UTI - Chest x-ray is not indicative of any infection - EKG shows a heart rate of 65, sinus rhythm, QTc 447 - Abnormal troponin to previous blood work - Concern for possible PE given patient's tachypnea and dyspnea - CTA chest shows no signs of PE, or any acute cardiopulmonary disease - Hemoglobin has come down some from 12.5 baseline to 10.8, if it is continuing to decrease on a.m. labs may consider anemia as a source of dyspnea -  No O2 requirement, will give option for comfort - TSH: WNL  - 2D echocardiogram: Revealing grade 1 diastolic dysfunction, ejection fraction 44-31%   Diastolic congestive heart failure, NYHA class 1 (HCC) - Advising to continue daily weight -Continue current medication -P.o. Lasix 20 mg has been added -2D echocardiogram was reviewed in detail, reporting grade 1 diastolic dysfunction, ejection  fraction of 60/65%  CKD (chronic kidney disease) stage 2, GFR 60-89 ml/min - Creatinine since 2019 1.5-1.9 Lab Results  Component Value Date   CREATININE 1.70 (H) 06/18/2022   CREATININE 1.74 (H) 06/17/2022   CREATININE 1.67 (H) 06/16/2022   BUN 42 GFR 30 today Estimated Creatinine Clearance: 23.6 mL/min (A) (by C-G formula based on SCr of 1.7 mg/dL (H)).  -We will attempt to modify and avoid all nephrotoxins   History of cardioembolic cerebrovascular accident (CVA) - Continue aspirin and statin  Hyperlipidemia - Continue statin  Resistant hypertension -With hypertensive crisis --improved with current medications -With renal artery stenosis recently stented on October 9 Ordered renal artery ultrasound- reported cannot be obtained at [facility]    - Continue: Cardura, losartan, hydralazine, Procardia, Aldactone -Substituting Coreg for labetalol 200 mg twice daily-to obtain a better BP control -Due to elevated creatinine discontinued chlorthalidone   - POA: Blood pressure 189/69-228/93 - CT head shows no acute abnormality - Creatinine is improved compared to the day of the stenting - EKG shows a heart rate of 65, sinus rhythm, QTc 447 -2D recording reviewed, grade 1 diastolic dysfunction, ejection fraction 60-65%       Consultants: Nephrologist Procedures performed: 2D echocardiogram Disposition: Home Diet recommendation:  Discharge Diet Orders (From admission, onward)     Start     Ordered   06/18/22 0000  Diet - low sodium heart healthy        06/18/22 1155           Cardiac diet DISCHARGE MEDICATION: Allergies as of 06/18/2022       Reactions   Sodium Bicarbonate Itching   Pepperoni [pickled Meat] Swelling   Penicillins Rash        Medication List     STOP taking these medications    amLODipine 10 MG tablet Commonly known as: NORVASC   carvedilol 12.5 MG tablet Commonly known as: COREG   chlorthalidone 25 MG tablet Commonly known as:  HYGROTON   hydrALAZINE 100 MG tablet Commonly known as: APRESOLINE       TAKE these medications    aspirin 81 MG chewable tablet Chew 81 mg by mouth daily.   atorvastatin 40 MG tablet Commonly known as: LIPITOR Take 1 tablet (40 mg total) by mouth daily.   Calcium 600+D3 Plus Minerals 600-800 MG-UNIT Tabs Take 1 tablet by mouth daily.   Cholecalciferol 50 MCG (2000 UT) Tabs Take 2,000 Units by mouth daily.   clopidogrel 75 MG tablet Commonly known as: Plavix Take 1 tablet (75 mg total) by mouth daily.   doxazosin 2 MG tablet Commonly known as: Cardura Take 1 tablet (2 mg total) by mouth daily.   furosemide 20 MG tablet Commonly known as: Lasix Take 1 tablet (20 mg total) by mouth daily.   labetalol 200 MG tablet Commonly known as: NORMODYNE Take 1 tablet (200 mg total) by mouth 2 (two) times daily.   losartan 25 MG tablet Commonly known as: COZAAR Take 12.5 mg by mouth daily.   multivitamin-iron-minerals-folic acid chewable tablet Chew 1 tablet by mouth daily.   NIFEdipine 60 MG 24 hr tablet Commonly known  as: PROCARDIA XL/NIFEDICAL XL Take 60 mg by mouth See admin instructions. Take with 30 for a total of 90 mg at bedtime   NIFEdipine 30 MG 24 hr tablet Commonly known as: PROCARDIA-XL/NIFEDICAL-XL Take 30 mg by mouth See admin instructions. Take with 60 mg for a total of 90 mg at bedtime   spironolactone 25 MG tablet Commonly known as: ALDACTONE Take 25 mg by mouth daily.               Durable Medical Equipment  (From admission, onward)           Start     Ordered   06/17/22 1332  For home use only DME Walker rolling  Once       Question Answer Comment  Walker: With 5 Inch Wheels   Patient needs a walker to treat with the following condition Weakness      06/17/22 1331            Discharge Exam: Filed Weights   06/16/22 1918  Weight: 63.2 kg      Physical Exam:   General:  AAO x 3,  cooperative, no distress;   HEENT:   Normocephalic, PERRL, otherwise with in Normal limits   Neuro:  CNII-XII intact. , normal motor and sensation, reflexes intact   Lungs:   Clear to auscultation BL, Respirations unlabored,  No wheezes / crackles  Cardio:    S1/S2, RRR, No murmure, No Rubs or Gallops   Abdomen:  Soft, non-tender, bowel sounds active all four quadrants, no guarding or peritoneal signs.  Muscular  skeletal:  Limited exam -global generalized weaknesses - in bed, able to move all 4 extremities,   2+ pulses,  symmetric, No pitting edema  Skin:  Dry, warm to touch, negative for any Rashes,  Wounds: Please see nursing documentation          Condition at discharge: fair  The results of significant diagnostics from this hospitalization (including imaging, microbiology, ancillary and laboratory) are listed below for reference.   Imaging Studies: US RENAL  Result Date: 06/17/2022 CLINICAL DATA:  220254 AKI (acute kidney injury) [facility]) [id number] Hypertensive emergency [phone] EXAM: RENAL / URINARY TRACT ULTRASOUND COMPLETE COMPARISON:  CT 03/10/2019 FINDINGS: Right Kidney: Renal measurements: 7.1 x 2.9 x 4.0 cm = volume: 41.7 mL. Echogenicity within normal limits. No mass or hydronephrosis visualized. Left Kidney: Renal measurements: 6.1 x 3.9 x 3.8 cm = volume: 46.9 mL. Echogenicity within normal limits. No mass. Extrarenal pelvis with mild pelvic fullness. Bladder: Moderately distended but otherwise unremarkable. Bilateral ureteral jets are seen. Other: None. IMPRESSION: Left extrarenal pelvis with mild pelvic fullness. No right hydronephrosis. Moderate bladder distension. Electronically Signed   By: Maurine Simmering M.D.   On: 06/17/2022 08:56   CT Angio Chest Pulmonary Embolism (PE) W or WO Contrast  Result Date: 06/17/2022 CLINICAL DATA:  Weakness, shortness of breath, positive D-dimer EXAM: CT ANGIOGRAPHY CHEST WITH CONTRAST TECHNIQUE: Multidetector CT imaging of the chest was performed using the standard  protocol during bolus administration of intravenous contrast. Multiplanar CT image reconstructions and MIPs were obtained to evaluate the vascular anatomy. RADIATION DOSE REDUCTION: This exam was performed according to the departmental dose-optimization program which includes automated exposure control, adjustment of the mA and/or kV according to patient size and/or use of iterative reconstruction technique. CONTRAST:  13mL OMNIPAQUE IOHEXOL 350 MG/ML SOLN COMPARISON:  Chest radiograph dated 06/16/2022 FINDINGS: Cardiovascular: Satisfactory opacification of the bilateral pulmonary arteries to the lobar level. No evidence  of pulmonary embolism. Although not tailored for evaluation of the thoracic aorta, there is no evidence thoracic aortic aneurysm or dissection. Atherosclerotic calcifications of the arch. The heart is normal in size.  Trace anterior pericardial fluid. Three vessel coronary sclerosis. Mediastinum/Nodes: No suspicious mediastinal lymphadenopathy. Visualized thyroid is unremarkable. Lungs/Pleura: Mild centrilobular emphysematous changes, upper lung predominant. No focal consolidation. No suspicious pulmonary nodules. No pleural effusion or pneumothorax. Upper Abdomen: Visualized upper abdomen is grossly unremarkable, noting vascular calcifications. Musculoskeletal: Visualized osseous structures are within normal limits. Review of the MIP images confirms the above findings. IMPRESSION: No evidence of pulmonary embolism. No evidence of acute cardiopulmonary disease. Aortic Atherosclerosis (ICD10-I70.0) and Emphysema (ICD10-J43.9). Electronically Signed   By: Julian Hy M.D.   On: 06/17/2022 00:35   CT Head Wo Contrast  Result Date: 06/16/2022 CLINICAL DATA:  Mental status change, unknown cause EXAM: CT HEAD WITHOUT CONTRAST TECHNIQUE: Contiguous axial images were obtained from the base of the skull through the vertex without intravenous contrast. RADIATION DOSE REDUCTION: This exam was  performed according to the departmental dose-optimization program which includes automated exposure control, adjustment of the mA and/or kV according to patient size and/or use of iterative reconstruction technique. COMPARISON:  None Available. FINDINGS: Brain: No acute intracranial abnormality. Specifically, no hemorrhage, hydrocephalus, mass lesion, acute infarction, or significant intracranial injury. Old left cerebellar infarct. Vascular: No hyperdense vessel or unexpected calcification. Skull: No acute calvarial abnormality. Sinuses/Orbits: No acute findings Other: None IMPRESSION: No acute intracranial abnormality. Electronically Signed   By: Rolm Baptise M.D.   On: 06/16/2022 22:49   DG Chest 2 View  Result Date: 06/16/2022 CLINICAL DATA:  Shortness of breath. EXAM: CHEST - 2 VIEW COMPARISON:  None Available. FINDINGS: The heart size and mediastinal contours are within normal limits. Both lungs are clear. The visualized skeletal structures are unremarkable. IMPRESSION: No active cardiopulmonary disease. Electronically Signed   By: Ronney Asters M.D.   On: 06/16/2022 19:51   PERIPHERAL VASCULAR CATHETERIZATION  Result Date: 06/10/2022 Images from the original result were not included. Patient name: ALYRIA KRACK Ross: [id number] DOB: Aug 01, 1946 Sex: female 06/10/2022 Pre-operative Diagnosis: Bilateral renal artery stenosis, claudication Post-operative diagnosis:  Same Surgeon:  Eda Paschal. Donzetta Matters, MD Procedure Performed: 1.  Ultrasound-guided cannulation right common femoral artery 2.  Aortogram 3.  Stent of left renal artery with 5 x 49mm Herculink 4.  Moderate sedation with fentanyl and Versed for 71 minutes Indications: 76 year old female with history of bilateral renal artery stenosis she also has claudication right greater than left after walking significant distance.  Renal artery stenosis symptomatic with hypertension and progressive renal failure.  She is indicated for aortogram with possible  intervention. Findings: Bilateral renal arteries were very tightly stenosed over 90%.  We were able to stent the left renal artery and this was reduced to 0% stenosis and immediately her blood pressure did drop approximately 80 points from systolic 409 down to systolic 811 mmHg.  I was able to cannulate the right renal artery but unable to pass a wire. We will see how patient's blood pressure and creatinine responds to left renal artery stent and we can consider repeat attempt at right renal artery stent in the future if necessary.  Procedure:  The patient was identified in the holding area and taken to room 8.  The patient was then placed supine on the table and prepped and draped in the usual sterile fashion.  A time out was called.  Ultrasound was used to evaluate the right  common femoral artery.  There was some calcification but no evidence of stenosis.  The area was anesthetized 1% lidocaine cannulated micropuncture needle followed by wire and the sheath.  And images saved department record.  Concomitantly she was administered fentanyl and Versed and her vital signs were monitored by bedside nursing throughout the case.  We placed a Bentson wire followed by 5 [place] sheath and Omni catheter to the level of T12 performed aortogram followed by magnified imaging to demonstrate bilateral renal artery stenosis.  Patient was then given 5000 units of heparin.  We were able to cannulate the left renal artery with a IM guide catheter and placed an 014 wire.  We then primarily stented with a 5 x 12 mm Herculink.  This was then postdilated with a 5 mm Ross directed cephalad.  We then turned our attention to the right side unfortunately this was unable to pass a wire.  Given her pressure had dropped significantly after stenting of the left we elected termination of the case.  Wire and catheters were removed.  We deployed a minx device on the right.  She tolerated procedure without any complication. Contrast: 80cc  Brandon C. Donzetta Matters, MD [facility] Office: [phone] Pager: [phone]   Microbiology: Results for orders placed or performed during the hospital encounter of 06/16/22  SARS Coronavirus 2 by RT PCR (hospital order, performed in [facility] hospital lab) *cepheid single result test* Anterior Nasal Swab     Status: None   Collection Time: 06/16/22 11:36 PM   Specimen: Anterior Nasal Swab  Result Value Ref Range Status   SARS Coronavirus 2 by RT PCR NEGATIVE NEGATIVE Final    Comment: (NOTE) SARS-CoV-2 target nucleic acids are NOT DETECTED.  The SARS-CoV-2 RNA is generally detectable in upper and lower respiratory specimens during the acute phase of infection. The lowest concentration of SARS-CoV-2 viral copies this assay can detect is 250 copies / mL. A negative result does not preclude SARS-CoV-2 infection and should not be used as the sole basis for treatment or other patient management decisions.  A negative result may occur with improper specimen collection / handling, submission of specimen other than nasopharyngeal swab, presence of viral mutation(s) within the areas targeted by this assay, and inadequate number of viral copies (<250 copies / mL). A negative result must be combined with clinical observations, patient history, and epidemiological information.  Fact Sheet for Patients:   https://www.patel.info/  Fact Sheet for Healthcare Providers: https://hall.com/  This test is not yet approved or  cleared by the [place] FDA and has been authorized for detection and/or diagnosis of SARS-CoV-2 by FDA under an Emergency Use Authorization (EUA).  This EUA will remain in effect (meaning this test can be used) for the duration of the COVID-19 declaration under Section 564(b)(1) of the Act, 21 U.S.C. section 360bbb-3(b)(1), unless the authorization is terminated or revoked sooner.  Performed at [facility], [address], [place], [place] [address]     Labs: CBC: Recent Labs  Lab 06/16/22 1954 06/17/22 0503  WBC 8.5 7.5  NEUTROABS  --  4.9  HGB 10.8* 10.3*  HCT 32.3* 31.1*  MCV 85.0 85.2  PLT 240 163   Basic Metabolic Panel: Recent Labs  Lab 06/16/22 1954 06/17/22 0503 06/18/22 0532  NA 135 135 136  K 4.6 4.2 4.1  CL 99 103 103  CO2 23 24 23   GLUCOSE 96 129* 95  BUN 45* 42* 40*  CREATININE 1.67* 1.74*  1.70*  CALCIUM 9.1 8.7* 9.0  MG  --  2.1  --    Liver Function Tests: Recent Labs  Lab 06/17/22 0503  AST 17  ALT 15  ALKPHOS 62  BILITOT 0.7  PROT 6.7  ALBUMIN 3.4*   CBG: Recent Labs  Lab 06/16/22 2013  GLUCAP 94    Discharge time spent: greater than 40  minutes.  Signed: Deatra James, MD [facility] 06/18/2022

## 2022-06-18 NOTE — Progress Notes (Signed)
*  PRELIMINARY RESULTS* Echocardiogram 2D Echocardiogram has been performed.  Andrea Ross 06/18/2022, 5:18 PM

## 2022-06-18 NOTE — Progress Notes (Signed)
Pt has rested well tonight.  SR on monitor.  No c/o pain.  Has remained in bed, very fatigue/weak.  BP stable at this time.  Continue to monitor

## 2022-06-18 NOTE — Consult Note (Signed)
[place] Date: 06/16/2022 06/18/2022 Rexene Agent Requesting Physician:  Roger Shelter MD  Reason for Consult:  Resistant HTN with emergency, CKD3b,  HPI:  69F admitted 10/16 after presenting to the [facility], ED with progressive dyspnea.  Patient PMH includes resistant hypertension with recent findings of renal artery stenosis status post renal angiogram 10/9 with Dr. Gwenlyn Saran and left renal artery stent placed for high-grade greater than 90% stenosis; similar stenosis on the right side with inability to complete stenting; history of CVA; hyperlipidemia; known PAD. Home antihypertensives appear to include carvedilol, chlorthalidone, doxazosin, hydralazine, losartan, nifedipine, spironolactone.  Blood pressure is managed by Dr. Oval Linsey with cardiology and Dr. Theador Hawthorne.  In July plasma renin and aldosterone levels were not consistent with hyperaldosteronism.  Patient has CKD 3B/4 followed by Dr. Theador Hawthorne.  Trend in serum creatinine as below.  Looks like baseline ranges between 1.7 and 1.9.  Upon arrival the patient was dyspneic but stable on room air.  Blood pressures were elevated, especially systolic.  Work-up included CTA to evaluate for PE, not identified nor was there significant evidence of pulmonary edema or effusions.  2 view chest x-ray with no active cardiopulmonary disease.  Renal ultrasound completed with some fullness of the left extrarenal pelvis but no evidence of hydronephrosis.  Since arrival patient has been continued on her hypertensives with the exception of not receiving her chlorthalidone.  Blood pressures overnight and this morning with systolics ranging from 712-458, diastolics in the 09X to 83J.  She has no edema.  Urine output is normal.   Creatinine, Ser (mg/dL)  Date Value  06/18/2022 1.70 (H)  06/17/2022 1.74 (H)  06/16/2022 1.67 (H)  06/10/2022 1.90 (H)  11/29/2021 1.70 (H)  05/17/2021 1.84 (H)  12/22/2020 1.92 (H)  09/22/2019 1.94 (H)  03/16/2019 1.78  (H)  01/01/2018 1.61 (H)  ] I/Os: I/O last 3 completed shifts: In: 8250 [P.O.:840; IV Piggyback:500] Out: 2000 [Urine:2000]   ROS NSAIDS: No use Balance of 12 systems is negative w/ exceptions as above  PMH  Past Medical History:  Diagnosis Date   Glaucoma    Heart murmur    Hyperlipidemia    Hypertension    Kidney failure    Resistant hypertension 05/11/2014   Stroke ([place]) [id number]   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 [facility];  Service: Cardiovascular;;   SLT LASER APPLICATION Right 1/93/7902   Procedure: SLT LASER APPLICATION;  Surgeon: Williams Che, MD;  Location: [facility];  Service: Ophthalmology;  Laterality: Right;   UMBILICAL HERNIA REPAIR  1980   FH  Family History  Problem Relation Age of Onset   Hypertension Mother    Heart failure Mother    Diabetes Mother    Heart disease Mother    Hypertension Father    Cancer Father    Kidney disease Brother    Hypertension Brother    Stroke Brother    [place]  reports that she quit smoking about 51 years ago. Her smoking use  included cigarettes. She started smoking about 51 years ago. She has a 0.08 pack-year smoking history. She has never used smokeless tobacco. She reports that she does not drink alcohol and does not use drugs. Allergies  Allergies  Allergen Reactions   Sodium Bicarbonate Itching   Pepperoni [Pickled Meat] Swelling   Penicillins Rash   Home medications Prior to Admission medications    Medication Sig Start Date End Date Taking? Authorizing Provider  aspirin 81 MG chewable tablet Chew 81 mg by mouth daily.   Yes [provider]  atorvastatin (LIPITOR) 40 MG tablet Take 1 tablet (40 mg total) by mouth daily. 03/07/22  Yes Hawks, Christy A, FNP  Calcium Carbonate-Vit D-Min (CALCIUM 600+D3 PLUS MINERALS) 600-800 MG-UNIT TABS Take 1 tablet by mouth daily.   Yes [provider]  carvedilol (COREG) 12.5 MG tablet Take 12.5 mg by mouth 2 (two) times daily. 03/28/22  Yes [provider]  chlorthalidone (HYGROTON) 25 MG tablet Take 25 mg by mouth daily. 04/14/21  Yes [provider]  Cholecalciferol 50 MCG (2000 UT) TABS Take 2,000 Units by mouth daily.   Yes [provider]  clopidogrel (PLAVIX) 75 MG tablet Take 1 tablet (75 mg total) by mouth daily. 06/10/22 06/10/23 Yes Waynetta Sandy, MD  doxazosin (CARDURA) 2 MG tablet Take 1 tablet (2 mg total) by mouth daily. 04/01/22  Yes Skeet Latch, MD  hydrALAZINE (APRESOLINE) 100 MG tablet Take 100 mg by mouth 3 (three) times daily.   Yes [provider]  losartan (COZAAR) 25 MG tablet Take 12.5 mg by mouth daily. 05/02/22  Yes [provider]  multivitamin-iron-minerals-folic acid (CENTRUM) chewable tablet Chew 1 tablet by mouth daily.   Yes [provider]  NIFEdipine (PROCARDIA XL/NIFEDICAL XL) 60 MG 24 hr tablet Take 60 mg by mouth See admin instructions. Take with 30 for a total of 90 mg at bedtime 05/02/22  Yes [provider]  NIFEdipine (PROCARDIA-XL/NIFEDICAL-XL) 30 MG 24 hr tablet Take 30 mg by mouth See admin instructions. Take with 60 mg for a total of 90 mg at bedtime 05/24/22  Yes [provider]  spironolactone (ALDACTONE) 25 MG tablet Take 25 mg by mouth daily. 02/09/20  Yes [provider]  amLODipine (NORVASC) 10 MG tablet Take 1 tablet (10 mg total) by mouth every evening. Patient not taking: Reported on 06/04/2022 04/01/22    Skeet Latch, MD    Current Medications Scheduled Meds:  aspirin  81 mg Oral Daily   atorvastatin  40 mg Oral Daily   clopidogrel  75 mg Oral Daily   doxazosin  2 mg Oral Daily   heparin  5,000 Units Subcutaneous Q8H   hydrALAZINE  50 mg Oral Q8H   labetalol  200 mg Oral BID   losartan  12.5 mg Oral Daily   NIFEdipine  90 mg Oral Daily   spironolactone  25 mg Oral Daily   Continuous Infusions: PRN Meds:.acetaminophen **OR** acetaminophen, albuterol, hydrALAZINE, morphine injection, ondansetron **OR** ondansetron (ZOFRAN) IV, oxyCODONE  CBC Recent Labs  Lab 06/16/22 1954 06/17/22 0503  WBC 8.5 7.5  NEUTROABS  --  4.9  HGB 10.8* 10.3*  HCT 32.3* 31.1*  MCV 85.0 85.2  PLT 240 240   Basic Metabolic Panel Recent Labs  Lab 06/16/22 1954 06/17/22 0503 06/18/22 0532  NA 135 135 136  K 4.6 4.2 4.1  CL 99 103 103  CO2 23 24 23   GLUCOSE 96 129* 95  BUN 45* 42* 40*  CREATININE  1.67* 1.74* 1.70*  CALCIUM 9.1 8.7* 9.0    Physical Exam  Blood pressure (!) 152/60, pulse (!) 57, temperature 98.3 F (36.8 C), resp. rate 18, height 5\' 4"  (1.626 m), weight 63.2 kg, SpO2 100 %. GEN: NAD ENT: NCAT EYES: EOMI CV: Regular, normal S1 and S2 PULM: Clear bilaterally ABD: No abdominal bruits, soft, nontender SKIN: No rashes or lesions EXT: No peripheral edema  Assessment 41F with CKD3b and resistant HTN improved after 10/9 stenting of L Renal Artery here with dyspnea.  CKD3b:  Follows with [facility] locally At baseline Probably HTN and ischemic nephropathy as causes Renal US at admit reassuring; Admit UA w/o protein / blood Resistant HTN with b/l RAS > 90% S/p L renal artery stent 10/9 VVS [place] and BPs improved Still with > 90% R stenosis but BPs improved and GFR stable; I don't think there is a reason to go back and attempt stentign on the R Cont current regimen Check orthostatics MIght want to add back chlorthalidone based on results Dyspnea: No pulmonary edema on  imaging.  Might be afterload effect.  Probably needs TTE.  Doubt anemia primarily causative Anemia: Hb 10s stable  Plan As above Daily weights, Daily Renal Panel, Strict I/Os, Avoid nephrotoxins (NSAIDs, judicious IV Contrast)    Rexene Agent  06/18/2022, 9:20 AM

## 2022-06-18 NOTE — Progress Notes (Signed)
Ng Discharge Note  Admit Date:  06/16/2022 Discharge date: 06/18/2022   Andrea Ross to be D/C'd Home per MD order.  AVS completed. Patient/caregiver able to verbalize understanding.  Discharge Medication: Allergies as of 06/18/2022       Reactions   Sodium Bicarbonate Itching   Pepperoni [pickled Meat] Swelling   Penicillins Rash        Medication List     STOP taking these medications    amLODipine 10 MG tablet Commonly known as: NORVASC   carvedilol 12.5 MG tablet Commonly known as: COREG   chlorthalidone 25 MG tablet Commonly known as: HYGROTON       TAKE these medications    aspirin 81 MG chewable tablet Chew 81 mg by mouth daily.   atorvastatin 40 MG tablet Commonly known as: LIPITOR Take 1 tablet (40 mg total) by mouth daily.   Calcium 600+D3 Plus Minerals 600-800 MG-UNIT Tabs Take 1 tablet by mouth daily.   Cholecalciferol 50 MCG (2000 UT) Tabs Take 2,000 Units by mouth daily.   clopidogrel 75 MG tablet Commonly known as: Plavix Take 1 tablet (75 mg total) by mouth daily.   doxazosin 2 MG tablet Commonly known as: Cardura Take 1 tablet (2 mg total) by mouth daily.   furosemide 20 MG tablet Commonly known as: Lasix Take 1 tablet (20 mg total) by mouth daily.   hydrALAZINE 100 MG tablet Commonly known as: APRESOLINE Take 100 mg by mouth 3 (three) times daily.   labetalol 200 MG tablet Commonly known as: NORMODYNE Take 1 tablet (200 mg total) by mouth 2 (two) times daily.   losartan 25 MG tablet Commonly known as: COZAAR Take 12.5 mg by mouth daily.   multivitamin-iron-minerals-folic acid chewable tablet Chew 1 tablet by mouth daily.   NIFEdipine 60 MG 24 hr tablet Commonly known as: PROCARDIA XL/NIFEDICAL XL Take 60 mg by mouth See admin instructions. Take with 30 for a total of 90 mg at bedtime   NIFEdipine 30 MG 24 hr tablet Commonly known as: PROCARDIA-XL/NIFEDICAL-XL Take 30 mg by mouth See admin instructions. Take with  60 mg for a total of 90 mg at bedtime   spironolactone 25 MG tablet Commonly known as: ALDACTONE Take 25 mg by mouth daily.               Durable Medical Equipment  (From admission, onward)           Start     Ordered   06/17/22 1332  For home use only DME Walker rolling  Once       Question Answer Comment  Walker: With 5 Inch Wheels   Patient needs a walker to treat with the following condition Weakness      06/17/22 1331            Discharge Assessment: Vitals:   06/18/22 1100 06/18/22 1400  BP:  (!) 108/53  Pulse: (!) 59 61  Resp:  18  Temp:  97.6 F (36.4 C)  SpO2:  100%   Skin clean, dry and intact without evidence of skin break down, no evidence of skin tears noted. IV catheter discontinued intact. Site without signs and symptoms of complications - no redness or edema noted at insertion site, patient denies c/o pain - only slight tenderness at site.  Dressing with slight pressure applied.  D/c Instructions-Education: Discharge instructions given to patient/family with verbalized understanding. D/c education completed with patient/family including follow up instructions, medication list, d/c activities limitations if indicated,  with other d/c instructions as indicated by MD - patient able to verbalize understanding, all questions fully answered. Patient instructed to return to ED, call 911, or call MD for any changes in condition.  Patient escorted via [place], and D/C home via private auto.  Tsosie Billing, LPN 15/61/5379 4:32 PM

## 2022-06-18 NOTE — Assessment & Plan Note (Signed)
-   Advising to continue daily weight -Continue current medication -P.o. Lasix 20 mg has been added -2D echocardiogram was reviewed in detail, reporting grade 1 diastolic dysfunction, ejection fraction of 60/65%

## 2022-06-18 NOTE — TOC Transition Note (Signed)
Transition of Care [facility]) - CM/SW Discharge Note   Patient Details  Name: Andrea Ross MRN: [id number] Date of Birth: 15-Aug-1946  [facility] CM/SW Contact:  Shade Flood, LCSW Phone Number: 06/18/2022, 10:09 AM   Clinical Narrative:     Per MD, pt may dc later today. TOC arranged RW delivery to pt's room for dc. Pt has declined HH/outpatient PT. No other TOC needs for dc.    Barriers to Discharge: Continued Medical Work up   Patient Goals and CMS Choice Patient states their goals for this hospitalization and ongoing recovery are:: go home CMS Medicare.gov Compare Post Acute Care list provided to:: Patient Choice offered to / list presented to : Patient  Discharge Placement                       Discharge Plan and Services In-house Referral: Clinical Social Work   Post Acute Care Choice: Durable Medical Equipment          DME Arranged: Gilford Rile rolling DME Agency: [employer] Date DME Agency Contacted: 06/18/22   Representative spoke with at DME Agency: Thedore Mins HH Arranged: Refused [place]          Social Determinants of Health ([place]) Interventions     Readmission Risk Interventions    06/17/2022    1:23 PM  Readmission Risk Prevention Plan  PCP or Specialist Appt within 5-7 Days Complete  Home Care Screening Patient refused  Medication Review (RN CM) Complete

## 2022-06-19 ENCOUNTER — Other Ambulatory Visit (HOSPITAL_BASED_OUTPATIENT_CLINIC_OR_DEPARTMENT_OTHER): Payer: Self-pay | Admitting: Cardiovascular Disease

## 2022-06-19 ENCOUNTER — Telehealth: Payer: Self-pay

## 2022-06-19 ENCOUNTER — Other Ambulatory Visit (HOSPITAL_COMMUNITY): Payer: Self-pay | Admitting: Cardiovascular Disease

## 2022-06-19 DIAGNOSIS — I779 Disorder of arteries and arterioles, unspecified: Secondary | ICD-10-CM

## 2022-06-19 NOTE — Telephone Encounter (Signed)
Transition Care Management Follow-up Telephone Call Date of discharge and from where: 06/18/2022 [facility] How have you been since you were released from the hospital? Doing well - improving  Any questions or concerns? No  Items Reviewed: Did the pt receive and understand the discharge instructions provided? No  Medications obtained and verified? No  Other? No  Any new allergies since your discharge? No  Dietary orders reviewed? No Change  Do you have support at home?  yes  Home Care and Equipment/Supplies: Were home health services ordered? not applicable If so, what is the name of the agency? na  Has the agency set up a time to come to the patient's home? no Were any new equipment or medical supplies ordered?  No What is the name of the medical supply agency? [facility] Were you able to get the supplies/equipment? no Do you have any questions related to the use of the equipment or supplies? No  Patient has gotten a walker   Functional Questionnaire: (I = Independent and D = Dependent) ADLs: I  Bathing/Dressing- I  Meal Prep- I  Eating- I  Maintaining continence- I  Transferring/Ambulation- D - USING WALKER  Managing Meds- I  Follow up appointments reviewed:  PCP Hospital f/u appt confirmed?  [facility] WILL FOLLOW UP AS NEEDED    Specialist Hospital f/u appt confirmed? Yes  Scheduled to see CARDS Dr. Harl Bowie  on 06/21/2022 @ 1120am . Are transportation arrangements needed? No  If their condition worsens, is the pt aware to call PCP or go to the Emergency Dept.? Yes Was the patient provided with contact information for the PCP's office or ED? Yes Was to pt encouraged to call back with questions or concerns? Yes

## 2022-06-20 ENCOUNTER — Telehealth (HOSPITAL_BASED_OUTPATIENT_CLINIC_OR_DEPARTMENT_OTHER): Payer: Self-pay | Admitting: Cardiovascular Disease

## 2022-06-20 ENCOUNTER — Encounter: Payer: Self-pay | Admitting: Cardiology

## 2022-06-20 ENCOUNTER — Other Ambulatory Visit: Payer: Self-pay | Admitting: *Deleted

## 2022-06-20 DIAGNOSIS — I701 Atherosclerosis of renal artery: Secondary | ICD-10-CM

## 2022-06-20 NOTE — Telephone Encounter (Signed)
Spoke with patient regarding her appointment next week with Pharm D in [facility], she will keep as scheduled  Discussed her shortness of breath, has appointment with Dr Harl Bowie tomorrow Stated she is doing better than when she went into hospital and she will feel better after she rests for a little while  She will keep appointment tomorrow as scheduled

## 2022-06-20 NOTE — Telephone Encounter (Signed)
Pt c/o Shortness Of Breath: STAT if SOB developed within the last 24 hours or pt is noticeably SOB on the phone  1. Are you currently SOB (can you hear that pt is SOB on the phone)? yes  2. How long have you been experiencing SOB? For a while, went to the hospital on 10/15 for SOB  3. Are you SOB when sitting or when up moving around? both  4. Are you currently experiencing any other symptoms? More tired  pt called in to see if she should keep her PharmD appt on 10/24 at [facility] for her BP because her BP has been really good since leaving the hospital.

## 2022-06-20 NOTE — Telephone Encounter (Signed)
error 

## 2022-06-21 ENCOUNTER — Encounter: Payer: Self-pay | Admitting: Cardiology

## 2022-06-21 ENCOUNTER — Ambulatory Visit: Payer: POS | Attending: Cardiology | Admitting: Cardiology

## 2022-06-21 VITALS — BP 134/68 | HR 68 | Ht 64.0 in | Wt 132.6 lb

## 2022-06-21 DIAGNOSIS — R0602 Shortness of breath: Secondary | ICD-10-CM

## 2022-06-21 MED ORDER — TICAGRELOR 90 MG PO TABS: 90.0000 mg | ORAL_TABLET | Freq: Two times a day (BID) | ORAL | 0 refills | Status: DC

## 2022-06-21 MED ORDER — TICAGRELOR 90 MG PO TABS: 90.0000 mg | ORAL_TABLET | Freq: Two times a day (BID) | ORAL | 6 refills | Status: DC

## 2022-06-21 NOTE — Progress Notes (Signed)
Clinical Summary Ms. Andrea Ross is a 76 y.o.female seen for follow up of the following medical problems. I had last seen in 2016, more recently seen by Dr Oval Linsey 03/2022      1. HTN - long history of HTN, historically has been difficult to control - she also has been noted to have significantly high bp in left arm compared to right, up to 30 mmHg. She denies any arm pain or weakness. Carotid US showed right subclavian stenosis.  - reports medication compliance - normal renin/aldo ratio, normal TSH.   - we decreased her aldactone to 12.25m daily due to increase in Cr.  - home bp log shows bp's 130-140s/70-80s   -seen in HTN clinic - recent stenting to left renal artery, unable to stent right.  - compliant with meds   2.Subclavian stenosis - bp's need to be checked in left arm   3. CKD - followed by Dr BTheador Hawthorne 4.Renal artery stenosis bilateral -followed by vascular, stent to left renal artery 06/2022 -unable to pass wire across right renal artery, could conider repeat in future   5. History of CVA   6.SOB - 06/2022 echo: LVEF 60-65%, no WMAs, grade I dd, normal RV, PASP 19, -normal IVC - CXR no acute process. BNP normal - CT PE negative, lung parenchyma just mild centrilobular changes   - breathing gets short with talking or moving around. Fatigue with activities.  - no cough, no wheezing - no swelling, no chest pains - plavix was new after the stent,   Past Medical History:  Diagnosis Date   Glaucoma    Heart murmur    Hyperlipidemia    Hypertension    Kidney failure    Resistant hypertension 05/11/2014   [place] [id number]  Subclavian arterial stenosis ([facility]) 04/01/2022     Allergies  Allergen Reactions   Sodium Bicarbonate Itching   Pepperoni [Pickled Meat] Swelling   Penicillins Rash     Current Outpatient Medications  Medication Sig Dispense Refill   aspirin 81 MG chewable tablet Chew 81 mg by mouth daily.     atorvastatin (LIPITOR) 40  MG tablet Take 1 tablet (40 mg total) by mouth daily. 90 tablet 1   Calcium Carbonate-Vit D-Min (CALCIUM 600+D3 PLUS MINERALS) 600-800 MG-UNIT TABS Take 1 tablet by mouth daily.     Cholecalciferol 50 MCG (2000 UT) TABS Take 2,000 Units by mouth daily.     clopidogrel (PLAVIX) 75 MG tablet Take 1 tablet (75 mg total) by mouth daily. 30 tablet 11   doxazosin (CARDURA) 2 MG tablet Take 1 tablet (2 mg total) by mouth daily. 90 tablet 3   furosemide (LASIX) 20 MG tablet Take 1 tablet (20 mg total) by mouth daily. 30 tablet 11   labetalol (NORMODYNE) 200 MG tablet Take 1 tablet (200 mg total) by mouth 2 (two) times daily. 60 tablet 2   losartan (COZAAR) 25 MG tablet Take 12.5 mg by mouth daily.     multivitamin-iron-minerals-folic acid (CENTRUM) chewable tablet Chew 1 tablet by mouth daily.     NIFEdipine (PROCARDIA XL/NIFEDICAL XL) 60 MG 24 hr tablet Take 60 mg by mouth See admin instructions. Take with 30 for a total of 90 mg at bedtime     NIFEdipine (PROCARDIA-XL/NIFEDICAL-XL) 30 MG 24 hr tablet Take 30 mg by mouth See admin instructions. Take with 60 mg for a total of 90 mg at bedtime     spironolactone (ALDACTONE) 25 MG tablet Take 25  mg by mouth daily.     No current facility-administered medications for this visit.     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   HIATAL HERNIA REPAIR  2004   PERIPHERAL VASCULAR INTERVENTION Left 06/10/2022   Procedure: PERIPHERAL VASCULAR INTERVENTION;  Surgeon: Waynetta Sandy, MD;  Location: [place] [facility];  Service: Cardiovascular;  Laterality: Left;   RENAL ANGIOGRAPHY  06/10/2022   Procedure: RENAL ANGIOGRAPHY;  Surgeon: Waynetta Sandy, MD;  Location: [place] [facility];  Service: Cardiovascular;;   SLT LASER APPLICATION Right 09/03/5850   Procedure: SLT LASER APPLICATION;  Surgeon: Williams Che, MD;  Location: [facility];  Service:  Ophthalmology;  Laterality: Right;   UMBILICAL HERNIA REPAIR  1980     Allergies  Allergen Reactions   Sodium Bicarbonate Itching   Pepperoni [Pickled Meat] Swelling   Penicillins Rash      Family History  Problem Relation Age of Onset   Hypertension Mother    Heart failure Mother    Diabetes Mother    Heart disease Mother    Hypertension Father    Cancer Father    Kidney disease Brother    Hypertension Brother    Stroke Brother      Social History Ms. Partin reports that she quit smoking about 51 years ago. Her smoking use included cigarettes. She started smoking about 52 years ago. She has a 0.08 pack-year smoking history. She has never used smokeless tobacco. Ms. Silvestro reports no history of alcohol use.   Review of Systems CONSTITUTIONAL: No weight loss, fever, chills, weakness or fatigue.  HEENT: Eyes: No visual loss, blurred vision, double vision or yellow sclerae.No hearing loss, sneezing, congestion, runny nose or sore throat.  SKIN: No rash or itching.  CARDIOVASCULAR: per hpi RESPIRATORY: per hpi GASTROINTESTINAL: No anorexia, nausea, vomiting or diarrhea. No abdominal pain or blood.  GENITOURINARY: No burning on urination, no polyuria NEUROLOGICAL: No headache, dizziness, syncope, paralysis, ataxia, numbness or tingling in the extremities. No change in bowel or bladder control.  MUSCULOSKELETAL: No muscle, back pain, joint pain or stiffness.  LYMPHATICS: No enlarged nodes. No history of splenectomy.  PSYCHIATRIC: No history of depression or anxiety.  ENDOCRINOLOGIC: No reports of sweating, cold or heat intolerance. No polyuria or polydipsia.  Marland Kitchen   Physical Examination Today's Vitals   06/21/22 1127  BP: 134/68  Pulse: 68  SpO2: 100%  Weight: 132 lb 9.6 oz (60.1 kg)  Height: _0  (1.626 m)   Body mass index is 22.76 kg/m.  Gen: resting comfortably, no acute distress HEENT: no scleral icterus, pupils equal round and reactive, no palptable  cervical adenopathy,  CV: RRR, no m/r/g no jvd Resp: Clear to auscultation bilaterally GI: abdomen is soft, non-tender, non-distended, normal bowel sounds, no hepatosplenomegaly MSK: extremities are warm, no edema.  Skin: warm, no rash Neuro:  no focal deficits Psych: appropriate affect   Diagnostic Studies  06/2015 echo Study Conclusions  - Left ventricle: The cavity size was normal. Wall thickness was   increased in a pattern of mild LVH. Systolic function was normal.   The estimated ejection fraction was in the range of 60% to 65%.   Wall motion was normal; there were no regional wall motion   abnormalities. Doppler parameters are consistent with abnormal   left ventricular relaxation (grade 1 diastolic dysfunction). - Aortic valve: Mildly calcified annulus. Trileaflet;  mildly   calcified leaflets. There was trivial regurgitation. - Mitral valve: Calcified annulus. There was trivial regurgitation. - Right atrium: Central venous pressure (est): 3 mm Hg. - Atrial septum: No defect or patent foramen ovale was identified. - Tricuspid valve: There was trivial regurgitation. - Pulmonary arteries: Systolic pressure could not be accurately   estimated. - Pericardium, extracardiac: There was no pericardial effusion.  Impressions:  - Mild LVH with LVEF 60-65% and grade 1 diastolic dysfunction. MAC   with trivial mitral regurgitation. Sclerotic aortic valve without   stenosis, trivial aortic regurgitation noted.     06/2015 Carotid US 1-39% bilateral ICA diseae, right subclavian stenosis   Assessment and Plan  1. SOB - started after renal artery stenting 10/9 - SOB just talking for periods of time, but also with exertion.  - extensive workup during recent admission with CXR, CT PE, labs, echo as reported above benign.  - her exam is benign - unclear cause of her SOB, timing wise one thing that is new since 10/9 when symptoms started is the plavix. Plavix not common cause of  SOB but from review there have been cases. Will try changing plavix to brillinta for now and see if SOB improves. With age and prior CVA not a candidate for effient.   2.HTN - defer ongonig management to HTN clinic, bp's look good today      Arnoldo Lenis, M.D.

## 2022-06-21 NOTE — Patient Instructions (Addendum)
Medication Instructions:  Stop Plavix (Clopidogrel) Begin Brilinta 90mg  twice a day  Continue all other medications.     Labwork: none  Testing/Procedures: none  Follow-Up: 6 weeks   Any Other Special Instructions Will Be Listed Below (If Applicable). Call the office on Wednesday to update [place] on how your feeling.   If you need a refill on your cardiac medications before your next appointment, please call your pharmacy.

## 2022-06-25 ENCOUNTER — Encounter: Payer: Self-pay | Admitting: Pharmacist Clinician (PhC)/ Clinical Pharmacy Specialist

## 2022-06-25 ENCOUNTER — Ambulatory Visit (INDEPENDENT_AMBULATORY_CARE_PROVIDER_SITE_OTHER): Payer: POS | Admitting: Pharmacist Clinician (PhC)/ Clinical Pharmacy Specialist

## 2022-06-25 DIAGNOSIS — I1A0 Resistant hypertension: Secondary | ICD-10-CM | POA: Diagnosis not present

## 2022-06-25 NOTE — Progress Notes (Unsigned)
06/25/2022 Andrea Ross 1946-01-08 [id number]   HPI:  Andrea Ross is a 76 y.o. female patient of Dr Oval Linsey, with a PMH below who presents today for advanced hypertension clinic follow up.  She was referred to the [facility] by Dr. Carlyle Dolly, and when seen by Dr. Oval Linsey last month, had a BP of 190/82.  Dr. Oval Linsey added doxazosin 2 mg bid, as we are somewhat limited by her CKD diagnosis.  Since then her neprhologist switched amlodipine to nifedipine XL 90 mg and added losartan 12.5 mg.  In early Sept she had renal doppler done, which showed > 60% stenosis in both renal arteries.   Angiography done earlier this month (Oct 9) showed both arteries at > 90% stenosed.  Left artery was stented and BP was seen to drop 80 points immediately.  They were unable to stent the right artery.  She then saw Dr. Zandra Abts last week, at which time her BP was improved to 134/68.    Today she is in the office for follow up.  Her home blood pressure readings are still running a little high, but have improved greatly since the procedure. She has had some recent shortness of breath, so Dr. Harl Bowie switched her clopidogrel to Brilinta.  It is not common for clopidogrel to cause SOB, but he switched just to eliminate one potential cause.  Today she notes the the SOB is still present, although she feels it is not as bad as before.    Home range 025-427 systolic SOB slightly improved since switch to Brilinta  Second reading 150/76  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 in [place]     Blood Pressure Goal:  130/80  Current Medications:   doxazosin 2 mg qd, labetalol 200 mg bid, losartan 12.5 mg qd, nifedipine xl 90 mg qhs, spironolactone 25 mg qd  Social Hx: no tobacco, no alcohol, coffee and soda regularly, down to 1-2 cups coffee at most (decaf); Mt Dew  (1-2 per week) or occasional  Coke  Diet:    mostly home cooked meals, vegetables canned, but does rinse, uses very little salt at home, no heat and eat; not regularly snacking (does get lightly salted chips when does)  Exercise: none  Home BP readings:   only has 10 or so readings with her from the past 2 weeks since the procedure.  Highest systolic reading was 062, lowest 106.  Most readings were in the 120-130's.  When I saw her before the procedure, her August BP readings averaged 376 systolic in the mornings and 153 at night.    Intolerances:  no cardiac medication intolerances  Labs: 3/23 Na 142, K 4.9, Glu 89, BUN 24, SCr 1.70, GFR 31   Wt Readings from Last 3 Encounters:  06/21/22 132 lb 9.6 oz (60.1 kg)  06/16/22 139 lb 3.5 oz (63.2 kg)  06/10/22 139 lb (63 kg)   BP Readings from Last 3 Encounters:  06/25/22 (!) 148/66  06/21/22 134/68  06/18/22 (!) 108/53   Pulse Readings from Last 3 Encounters:  06/25/22 76  06/21/22 68  06/18/22 61    Current Outpatient Medications  Medication Sig Dispense Refill   aspirin 81 MG chewable tablet Chew 81 mg by mouth daily.     atorvastatin (LIPITOR) 40 MG tablet Take 1 tablet (40 mg total) by mouth daily.  90 tablet 1   Calcium Carbonate-Vit D-Min (CALCIUM 600+D3 PLUS MINERALS) 600-800 MG-UNIT TABS Take 1 tablet by mouth daily.     Cholecalciferol 50 MCG (2000 UT) TABS Take 2,000 Units by mouth daily.     doxazosin (CARDURA) 2 MG tablet Take 1 tablet (2 mg total) by mouth daily. 90 tablet 3   furosemide (LASIX) 20 MG tablet Take 1 tablet (20 mg total) by mouth daily. 30 tablet 11   labetalol (NORMODYNE) 200 MG tablet Take 1 tablet (200 mg total) by mouth 2 (two) times daily. 60 tablet 2   losartan (COZAAR) 25 MG tablet Take 12.5 mg by mouth daily.     multivitamin-iron-minerals-folic acid (CENTRUM) chewable tablet Chew 1 tablet by mouth daily.     NIFEdipine (PROCARDIA XL/NIFEDICAL XL) 60 MG 24 hr tablet Take 60 mg by mouth See admin instructions. Take with 30 for  a total of 90 mg at bedtime     NIFEdipine (PROCARDIA-XL/NIFEDICAL-XL) 30 MG 24 hr tablet Take 30 mg by mouth See admin instructions. Take with 60 mg for a total of 90 mg at bedtime     spironolactone (ALDACTONE) 25 MG tablet Take 25 mg by mouth daily.     ticagrelor (BRILINTA) 90 MG TABS tablet Take 1 tablet (90 mg total) by mouth 2 (two) times daily. 60 tablet 6   No current facility-administered medications for this visit.    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  Resistant hypertension Patient with resistant hypertension, secondary to renal artery stenosis.  Now that one renal artery was opened, her home BP readings have dropped significantly.   Will have her continue with current medications for now and continue with home BP monitoring.  She is scheduled to follow up with Nephrology in early November and Dr. Harl Bowie in December.  Should she need follow up after that, she can be scheduled.      Tommy Medal PharmD CPP [place] [address] [place], [place] [address] [phone]

## 2022-06-25 NOTE — Patient Instructions (Signed)
  Check your blood pressure at home 3-4 times each week (twice daily) and keep record of the readings.  Take your BP meds as follows:  Continue with all your current medication  Bring all of your meds, your BP cuff and your record of home blood pressures to your next appointment.  Exercise as you're able, try to walk approximately 30 minutes per day.  Keep salt intake to a minimum, especially watch canned and prepared boxed foods.  Eat more fresh fruits and vegetables and fewer canned items.  Avoid eating in fast food restaurants.    HOW TO TAKE YOUR BLOOD PRESSURE: Rest 5 minutes before taking your blood pressure.  Don't smoke or drink caffeinated beverages for at least 30 minutes before. Take your blood pressure before (not after) you eat. Sit comfortably with your back supported and both feet on the floor (don't cross your legs). Elevate your arm to heart level on a table or a desk. Use the proper sized cuff. It should fit smoothly and snugly around your bare upper arm. There should be enough room to slip a fingertip under the cuff. The bottom edge of the cuff should be 1 inch above the crease of the elbow. Ideally, take 3 measurements at one sitting and record the average.

## 2022-06-25 NOTE — Assessment & Plan Note (Signed)
Patient with resistant hypertension, secondary to renal artery stenosis.  Now that one renal artery was opened, her home BP readings have dropped significantly.   Will have her continue with current medications for now and continue with home BP monitoring.  She is scheduled to follow up with Nephrology in early November and Dr. Harl Bowie in December.  Should she need follow up after that, she can be scheduled.

## 2022-07-02 ENCOUNTER — Telehealth: Payer: Self-pay | Admitting: Family

## 2022-07-02 NOTE — Telephone Encounter (Signed)
Busy

## 2022-07-02 NOTE — Telephone Encounter (Signed)
Appt made with dod

## 2022-07-03 ENCOUNTER — Ambulatory Visit: Payer: POS | Admitting: Family Medicine

## 2022-07-03 ENCOUNTER — Encounter: Payer: Self-pay | Admitting: Family Medicine

## 2022-07-03 VITALS — BP 141/63 | HR 76 | Temp 97.4°F | Ht 64.0 in | Wt 137.0 lb

## 2022-07-03 DIAGNOSIS — R630 Anorexia: Secondary | ICD-10-CM

## 2022-07-03 DIAGNOSIS — I150 Renovascular hypertension: Secondary | ICD-10-CM

## 2022-07-03 DIAGNOSIS — R531 Weakness: Secondary | ICD-10-CM | POA: Diagnosis not present

## 2022-07-03 DIAGNOSIS — R5383 Other fatigue: Secondary | ICD-10-CM

## 2022-07-03 DIAGNOSIS — D649 Anemia, unspecified: Secondary | ICD-10-CM

## 2022-07-03 MED ORDER — MIRTAZAPINE 15 MG PO TBDP: 15.0000 mg | ORAL_TABLET | Freq: Every day | ORAL | 1 refills | Status: DC

## 2022-07-03 NOTE — Progress Notes (Signed)
BP (!) 141/63   Pulse 76   Temp (!) 97.4 F (36.3 C)   Ht _0  (1.626 m)   Wt 137 lb (62.1 kg)   SpO2 100%   BMI 23.52 kg/m    Subjective:   Patient ID: Andrea Ross, female    DOB: Dec 25, 1945, 76 y.o.   MRN: [id number]  HPI: Andrea Ross is a 76 y.o. female presenting on 07/03/2022 for decreased appetite (Since kidney stint) and Fatigue   HPI ER and hospital follow-up and decreased energy Patient was taken for procedure on 06/10/2022 for renal artery stenosis and had a stent placed for elevated blood pressure.  She went back to the emergency department on 06/16/2022 for generalized weakness and resistant hypertension and was discharged on 06/18/2022.  Since that time she is brought here with her sister and just says that she has been feeling more weak and energy being down and sometimes feels short winded because of her energy being down when she is up and moving around and walking.  Says all this has been happening since 06/10/2022 when the renal artery stent was placed.  She has had a CT for PE on 06/16/2022 which showed no signs of PE or cardiopulmonary disease.  She has been slightly anemic with a hemoglobin that came down from 12.5-10.8 in the procedure.  It was stable on this last hospitalization.  She had a normal echocardiogram during this last visit as well.  Her kidney function has been stable.  Her blood pressure has been running lower at home and is currently 141/63 and she says its been lower than that.  She says she will feel weak and shaky and then feels like she has to eat something to give her energy back.  Relevant past medical, surgical, family and social history reviewed and updated as indicated. Interim medical history since our last visit reviewed. Allergies and medications reviewed and updated.  Review of Systems  Constitutional:  Positive for fatigue. Negative for chills and fever.  HENT:  Negative for congestion, ear discharge and ear pain.   Eyes:  Negative  for redness and visual disturbance.  Respiratory:  Negative for chest tightness and shortness of breath.   Cardiovascular:  Negative for chest pain and leg swelling.  Gastrointestinal:  Negative for abdominal pain and blood in stool.  Genitourinary:  Negative for difficulty urinating, dysuria and hematuria.  Musculoskeletal:  Negative for back pain and gait problem.  Skin:  Negative for rash.  Neurological:  Positive for weakness. Negative for dizziness, light-headedness, numbness and headaches.  Psychiatric/Behavioral:  Negative for agitation, behavioral problems, dysphoric mood, self-injury, sleep disturbance and suicidal ideas. The patient is not nervous/anxious.   All other systems reviewed and are negative.   Per HPI unless specifically indicated above   Allergies as of 07/03/2022       Reactions   Sodium Bicarbonate Itching   Pepperoni [pickled Meat] Swelling   Penicillins Rash        Medication List        Accurate as of July 03, 2022 12:31 PM. If you have any questions, ask your nurse or doctor.          aspirin 81 MG chewable tablet Chew 81 mg by mouth daily.   atorvastatin 40 MG tablet Commonly known as: LIPITOR Take 1 tablet (40 mg total) by mouth daily.   Calcium 600+D3 Plus Minerals 600-800 MG-UNIT Tabs Take 1 tablet by mouth daily.   Cholecalciferol 50 MCG (2000  UT) Tabs Take 2,000 Units by mouth daily.   clopidogrel 75 MG tablet Commonly known as: PLAVIX Take 75 mg by mouth daily.   doxazosin 2 MG tablet Commonly known as: Cardura Take 1 tablet (2 mg total) by mouth daily.   furosemide 20 MG tablet Commonly known as: Lasix Take 1 tablet (20 mg total) by mouth daily.   labetalol 200 MG tablet Commonly known as: NORMODYNE Take 1 tablet (200 mg total) by mouth 2 (two) times daily.   losartan 25 MG tablet Commonly known as: COZAAR Take 12.5 mg by mouth daily.   mirtazapine 15 MG disintegrating tablet Commonly known as: REMERON  SOL-TAB Take 1 tablet (15 mg total) by mouth at bedtime. Started by: Worthy Rancher, MD   multivitamin-iron-minerals-folic acid chewable tablet Chew 1 tablet by mouth daily.   NIFEdipine 60 MG 24 hr tablet Commonly known as: PROCARDIA XL/NIFEDICAL XL Take 60 mg by mouth See admin instructions. Take with 30 for a total of 90 mg at bedtime   NIFEdipine 30 MG 24 hr tablet Commonly known as: PROCARDIA-XL/NIFEDICAL-XL Take 30 mg by mouth See admin instructions. Take with 60 mg for a total of 90 mg at bedtime   spironolactone 25 MG tablet Commonly known as: ALDACTONE Take 25 mg by mouth daily.   ticagrelor 90 MG Tabs tablet Commonly known as: Brilinta Take 1 tablet (90 mg total) by mouth 2 (two) times daily.         Objective:   BP (!) 141/63   Pulse 76   Temp (!) 97.4 F (36.3 C)   Ht _0  (1.626 m)   Wt 137 lb (62.1 kg)   SpO2 100%   BMI 23.52 kg/m   Wt Readings from Last 3 Encounters:  07/03/22 137 lb (62.1 kg)  06/21/22 132 lb 9.6 oz (60.1 kg)  06/16/22 139 lb 3.5 oz (63.2 kg)    Physical Exam Vitals and nursing note reviewed.  Constitutional:      General: She is not in acute distress.    Appearance: She is well-developed. She is not diaphoretic.  Eyes:     Extraocular Movements: Extraocular movements intact.     Conjunctiva/sclera: Conjunctivae normal.     Pupils: Pupils are equal, round, and reactive to light.  Cardiovascular:     Rate and Rhythm: Normal rate and regular rhythm.     Heart sounds: Normal heart sounds. No murmur heard. Pulmonary:     Effort: Pulmonary effort is normal. No respiratory distress.     Breath sounds: Normal breath sounds. No wheezing.  Musculoskeletal:        General: No tenderness. Normal range of motion.  Skin:    General: Skin is warm and dry.     Findings: No rash.  Neurological:     Mental Status: She is alert and oriented to person, place, and time.     Coordination: Coordination normal.  Psychiatric:         Behavior: Behavior normal.       Assessment & Plan:   Problem List Items Addressed This Visit   None Visit Diagnoses     Weakness    -  Primary   Relevant Orders   Bayer DCA Hb A1c Waived   CBC with Differential/Platelet   CMP14+EGFR   TSH   Renovascular hypertension       Relevant Orders   Bayer DCA Hb A1c Waived   CBC with Differential/Platelet   CMP14+EGFR   TSH   Other  fatigue       Relevant Orders   Bayer DCA Hb A1c Waived   CBC with Differential/Platelet   CMP14+EGFR   TSH   Appetite loss       Relevant Medications   mirtazapine (REMERON SOL-TAB) 15 MG disintegrating tablet   Anemia, unspecified type           We will do some blood work to recheck her anemia and other blood counts.  We will try adding mirtazapine for appetite stimulant.  Likely she is just feeling decreased energy because her blood pressure was running so high for so long and now she is getting used to more accommodating to a lower blood pressure that she has not been used to.  Conservative management for that and increased hydration. Follow up plan: Return if symptoms worsen or fail to improve.  Counseling provided for all of the vaccine components Orders Placed This Encounter  Procedures   Bayer Travis Hb A1c Waived   CBC with Differential/Platelet   CMP14+EGFR   TSH    Caryl Pina, MD [facility] 07/03/2022, 12:31 PM

## 2022-07-04 LAB — CMP14+EGFR
ALT: 15 IU/L (ref 0–32)
AST: 18 IU/L (ref 0–40)
Albumin/Globulin Ratio: 1.8 (ref 1.2–2.2)
Albumin: 4.4 g/dL (ref 3.8–4.8)
Alkaline Phosphatase: 102 IU/L (ref 44–121)
BUN/Creatinine Ratio: 22 (ref 12–28)
BUN: 41 mg/dL — ABNORMAL HIGH (ref 8–27)
Bilirubin Total: 0.2 mg/dL (ref 0.0–1.2)
CO2: 24 mmol/L (ref 20–29)
Calcium: 9.8 mg/dL (ref 8.7–10.3)
Chloride: 98 mmol/L (ref 96–106)
Creatinine, Ser: 1.83 mg/dL — ABNORMAL HIGH (ref 0.57–1.00)
Globulin, Total: 2.4 g/dL (ref 1.5–4.5)
Glucose: 103 mg/dL — ABNORMAL HIGH (ref 70–99)
Potassium: 4.7 mmol/L (ref 3.5–5.2)
Sodium: 137 mmol/L (ref 134–144)
Total Protein: 6.8 g/dL (ref 6.0–8.5)
eGFR: 28 mL/min/{1.73_m2} — ABNORMAL LOW (ref >59)

## 2022-07-04 LAB — BAYER DCA HB A1C WAIVED: HB A1C (BAYER DCA - WAIVED): 5.3 % (ref 4.8–5.6)

## 2022-07-04 LAB — TSH: TSH: 2.64 u[IU]/mL (ref 0.450–4.500)

## 2022-07-09 ENCOUNTER — Telehealth: Payer: Self-pay

## 2022-07-09 NOTE — Telephone Encounter (Signed)
Pt called stating that she has an appt in the morning and is concerned about not eating.  Reviewed pt's chart, returned call for clarification, two identifiers used. Pt states that she gets incoherent and lethargic if she is unable to eat. Asked if she would be ok if she brought food with her to eat right after her renal US. She stated that would work. Confirmed understanding.

## 2022-07-10 ENCOUNTER — Ambulatory Visit (INDEPENDENT_AMBULATORY_CARE_PROVIDER_SITE_OTHER): Payer: POS | Admitting: Physician Assistant

## 2022-07-10 ENCOUNTER — Ambulatory Visit (HOSPITAL_COMMUNITY)
Admission: RE | Admit: 2022-07-10 | Discharge: 2022-07-10 | Disposition: A | Discharge status: Home / Self Care | Payer: POS | Source: Ambulatory Visit | Attending: Vascular Surgery | Admitting: Vascular Surgery

## 2022-07-10 VITALS — BP 142/73 | HR 68 | Temp 97.9°F | Resp 20 | Ht 64.0 in | Wt 130.0 lb

## 2022-07-10 DIAGNOSIS — I701 Atherosclerosis of renal artery: Secondary | ICD-10-CM | POA: Diagnosis present

## 2022-07-10 DIAGNOSIS — M25551 Pain in right hip: Secondary | ICD-10-CM

## 2022-07-10 NOTE — Progress Notes (Signed)
Office Note     CC:  follow up Requesting Provider:  Sharion Balloon, FNP  HPI: Andrea Ross is a 76 y.o. (1946/08/31) female who presents status post renal angiography with left renal artery stenting by Dr. Tressia Danas 06/10/2022.  This was performed due to sustained systolic blood pressures above 220.  Today her systolic blood pressure is 140.  She states at home she frequently gets readings in the 130s.  She is on aspirin, Plavix, statin daily.  She is complaining of right leg pain originating in the groin when weightbearing on the right leg.  She states she has only noticed this type of pain since surgery.  She denies any pain to the groin catheterization site.  She states she feels stiff in her right leg and groin however pain subsides as she moves around and warms up.  She denies any diagnosis of osteoarthritis.   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   HIATAL HERNIA REPAIR  2004   PERIPHERAL VASCULAR INTERVENTION Left 06/10/2022   Procedure: PERIPHERAL VASCULAR INTERVENTION;  Surgeon: Waynetta Sandy, MD;  Location: [facility];  Service: Cardiovascular;  Laterality: Left;   RENAL ANGIOGRAPHY  06/10/2022   Procedure: RENAL ANGIOGRAPHY;  Surgeon: Waynetta Sandy, MD;  Location: [facility];  Service: Cardiovascular;;   SLT LASER APPLICATION Right 5/57/3220   Procedure: SLT LASER APPLICATION;  Surgeon: Williams Che, MD;  Location: [facility];  Service: Ophthalmology;  Laterality: Right;   UMBILICAL HERNIA REPAIR  1980    Social History   Socioeconomic History   Marital status: Widowed    Spouse name: Not on file   Number  of children: 3   Years of education: Not on file   Highest education level: Not on file  Occupational History   Not on file  Tobacco Use   Smoking status: Former    Packs/day: 0.25    Years: 0.30    Total pack years: 0.08    Types: Cigarettes    Start date: 07/03/1970    Quit date: 09/02/1970    Years since quitting: 51.8    Passive exposure: Never   Smokeless tobacco: Never  Vaping Use   Vaping Use: Never used  Substance and Sexual Activity   Alcohol use: No    Alcohol/week: 0.0 standard drinks of alcohol   Drug use: No   Sexual activity: Not on file  Other Topics Concern   Not on file  Social History Narrative   Not on file   Social Determinants of Health   Financial Resource Strain: Low Risk  (04/01/2022)   Overall Financial Resource Strain (CARDIA)    Difficulty of Paying Living Expenses: Not hard at all  Food Insecurity: No Food Insecurity (04/01/2022)   Hunger Vital Sign    Worried About Running Out of Food in the Last Year: Never true    Ran Out of Food in the Last Year: Never true  Transportation Needs: No Transportation Needs (04/01/2022)   PRAPARE - Hydrologist (Medical): No    Lack of Transportation (Non-Medical):  No  Physical Activity: Inactive (04/01/2022)   Exercise Vital Sign    Days of Exercise per Week: 0 days    Minutes of Exercise per Session: 0 min  Stress: Not on file  Social Connections: Not on file  Intimate Partner Violence: Not on file    Family History  Problem Relation Age of Onset   Hypertension Mother    Heart failure Mother    Diabetes Mother    Heart disease Mother    Hypertension Father    Cancer Father    Kidney disease Brother    Hypertension Brother    Stroke Brother     Current Outpatient Medications  Medication Sig Dispense Refill   aspirin 81 MG chewable tablet Chew 81 mg by mouth daily.     atorvastatin (LIPITOR) 40 MG tablet Take 1 tablet (40 mg total) by mouth daily. 90 tablet 1   Calcium  Carbonate-Vit D-Min (CALCIUM 600+D3 PLUS MINERALS) 600-800 MG-UNIT TABS Take 1 tablet by mouth daily.     chlorthalidone (HYGROTON) 25 MG tablet Take 25 mg by mouth daily.     Cholecalciferol 50 MCG (2000 UT) TABS Take 2,000 Units by mouth daily.     clopidogrel (PLAVIX) 75 MG tablet Take 75 mg by mouth daily.     doxazosin (CARDURA) 2 MG tablet Take 1 tablet (2 mg total) by mouth daily. 90 tablet 3   furosemide (LASIX) 20 MG tablet Take 1 tablet (20 mg total) by mouth daily. 30 tablet 11   labetalol (NORMODYNE) 200 MG tablet Take 1 tablet (200 mg total) by mouth 2 (two) times daily. 60 tablet 2   losartan (COZAAR) 25 MG tablet Take 12.5 mg by mouth daily.     mirtazapine (REMERON SOL-TAB) 15 MG disintegrating tablet Take 1 tablet (15 mg total) by mouth at bedtime. 30 tablet 1   multivitamin-iron-minerals-folic acid (CENTRUM) chewable tablet Chew 1 tablet by mouth daily.     NIFEdipine (PROCARDIA XL/NIFEDICAL XL) 60 MG 24 hr tablet Take 60 mg by mouth See admin instructions. Take with 30 for a total of 90 mg at bedtime     NIFEdipine (PROCARDIA-XL/NIFEDICAL-XL) 30 MG 24 hr tablet Take 30 mg by mouth See admin instructions. Take with 60 mg for a total of 90 mg at bedtime     spironolactone (ALDACTONE) 25 MG tablet Take 25 mg by mouth daily.     ticagrelor (BRILINTA) 90 MG TABS tablet Take 1 tablet (90 mg total) by mouth 2 (two) times daily. 60 tablet 6   No current facility-administered medications for this visit.    Allergies  Allergen Reactions   Sodium Bicarbonate Itching   Pepperoni [Pickled Meat] Swelling   Penicillins Rash     REVIEW OF SYSTEMS:   [X]  denotes positive finding, [ ]  denotes negative finding Cardiac  Comments:  Chest pain or chest pressure:    Shortness of breath upon exertion:    Short of breath when lying flat:    Irregular heart rhythm:        Vascular    Pain in calf, thigh, or hip brought on by ambulation:    Pain in feet at night that wakes you up from  your sleep:     Blood clot in your veins:    Leg swelling:         Pulmonary    Oxygen at home:    Productive cough:     Wheezing:         Neurologic  Sudden weakness in arms or legs:     Sudden numbness in arms or legs:     Sudden onset of difficulty speaking or slurred speech:    Temporary loss of vision in one eye:     Problems with dizziness:         Gastrointestinal    Blood in stool:     Vomited blood:         Genitourinary    Burning when urinating:     Blood in urine:        Psychiatric    Major depression:         Hematologic    Bleeding problems:    Problems with blood clotting too easily:        Skin    Rashes or ulcers:        Constitutional    Fever or chills:      PHYSICAL EXAMINATION:  Vitals:   07/10/22 0917  BP: (!) 142/73  Pulse: 68  Resp: 20  Temp: 97.9 F (36.6 C)  TempSrc: Temporal  SpO2: 100%  Weight: 130 lb (59 kg)  Height: 5\' 4"  (1.626 m)    General:  WDWN in NAD; vital signs documented above Gait: Not observed HENT: WNL, normocephalic Pulmonary: normal non-labored breathing , without Rales, rhonchi,  wheezing Cardiac: regular HR Abdomen: soft, NT, no masses Skin: without rashes Vascular Exam/Pulses:  Right Left  Radial 2+ (normal) 2+ (normal)   Extremities: Palpable right ATA; 2+ palpable right femoral pulse without hematoma or fluid collection or pulsatile pseudoaneurysm; no pain with manipulation of right common femoral artery Musculoskeletal: no muscle wasting or atrophy  Neurologic: A&O X 3;  No focal weakness or paresthesias are detected Psychiatric:  The pt has Normal affect.   Non-Invasive Vascular Imaging:   Left renal artery stent widely patent Right renal artery with flow-limiting stenosis at the origin    ASSESSMENT/PLAN:: 76 y.o. female status post renal angiogram with left renal artery stenting; patient also experiencing right groin/hip pain  -Blood pressure significantly improved since renal artery  stenting.  Dr. Donzetta Matters also noticed 90% stenosis of the right renal artery origin.  He will consider repeating angiogram and intervening on right renal artery if blood pressure did not show any improvement.  She will continue aspirin, Plavix, statin daily.  We will plan to rescan renal arteries with duplex in 6 months.  -Patient states right groin and leg pain has been present only since surgery.  On exam I cannot palpate any fluid collection, hematoma, or pulsatile pseudoaneurysm at the right groin catheterization site.  She also does not have any pain with manipulation of the right common femoral artery.  She has stiffness in her right groin and hip and pain with weightbearing of her right leg.  This pain subsides as she moves around and warms up.  Differential diagnosis includes osteo arthritis of the right hip.  We will obtain a 2 view XR of the right hip and she will follow-up with her primary care physician for further work-up and referral to specialist if necessary.   Dagoberto Ligas, PA-C [facility] [phone]  Clinic MD:   Donzetta Matters

## 2022-07-16 ENCOUNTER — Telehealth: Payer: Self-pay | Admitting: Cardiology

## 2022-07-16 ENCOUNTER — Telehealth: Payer: Self-pay

## 2022-07-16 NOTE — Telephone Encounter (Signed)
Pt called stating that she is having extreme pain when moving at the area of her recent stent placement.  Pt called again with same complaint.  Reviewed pt's chart, returned call for clarification, two identifiers used. Explained that [place], [place] wanted her to have 2 x-rays completed and f/u with PCP. Pt was aware and stated that she was waiting for those to be scheduled. Informed her that it would be investigated and she would receive a phone call to schedule soon. Confirmed understanding.

## 2022-07-16 NOTE — Telephone Encounter (Signed)
Patient states she has a paralyzing pain when she gets up and when she sits down. She states it started since her surgery she had with Dr. Donzetta Matters 10/9. She says she has tried to contact Dr. Claretha Cooper office and has not been able to get through. She says she can hardly walk, and walks with a cane. She says she has been calling the office and has tried using the call back feature, but has not been successful.

## 2022-07-17 ENCOUNTER — Telehealth: Payer: Self-pay

## 2022-07-17 ENCOUNTER — Other Ambulatory Visit: Payer: Self-pay

## 2022-07-17 DIAGNOSIS — I701 Atherosclerosis of renal artery: Secondary | ICD-10-CM

## 2022-07-17 NOTE — Telephone Encounter (Signed)
Per Dr. Joesphine Bare like she saw vascular 07/10/22 and talked about it, would really be up to them, nothing we would have expertise in. Patient informed and advised if her pain is severe, to go back to the ED for an evaluation. Verbalized understanding of plan.

## 2022-07-17 NOTE — Telephone Encounter (Signed)
Returned pt's call this morning regarding her question about scheduling x'rays. I have left a voice mail for her to call us back if she still needs to speak with [place], as she did not answer my call.

## 2022-07-18 ENCOUNTER — Ambulatory Visit (INDEPENDENT_AMBULATORY_CARE_PROVIDER_SITE_OTHER): Payer: POS

## 2022-07-18 ENCOUNTER — Ambulatory Visit: Payer: POS | Admitting: Family

## 2022-07-18 ENCOUNTER — Encounter: Payer: Self-pay | Admitting: Family

## 2022-07-18 VITALS — BP 156/74 | HR 62 | Temp 97.7°F | Ht 64.0 in

## 2022-07-18 DIAGNOSIS — N189 Chronic kidney disease, unspecified: Secondary | ICD-10-CM | POA: Diagnosis not present

## 2022-07-18 DIAGNOSIS — R1031 Right lower quadrant pain: Secondary | ICD-10-CM

## 2022-07-18 DIAGNOSIS — I701 Atherosclerosis of renal artery: Secondary | ICD-10-CM | POA: Diagnosis not present

## 2022-07-18 MED ORDER — TRAMADOL HCL 50 MG PO TABS: 50.0000 mg | ORAL_TABLET | Freq: Four times a day (QID) | ORAL | 1 refills | Status: DC | PRN

## 2022-07-18 NOTE — Patient Instructions (Signed)
Adductor Muscle Strain  An adductor muscle strain, also called a groin strain or pull, is an injury to the muscles or tendons on the upper, inner part of the thigh. These muscles are called the adductor or groin muscles. They are responsible for moving the legs across the body or pulling the legs together. A muscle strain occurs when a muscle is overstretched and some muscle fibers are torn. The severity of an adductor muscle strain is rated as Grade 1, 2, or 3. A Grade 3 strain has the most tearing and pain. What are the causes? Adductor muscle strains usually occur during exercise or while participating in sports. This condition may be caused by: A sudden, violent force placed on the muscle, stretching it too far. Stretching the muscles too far or too suddenly, often during side-to-side motion with a sudden change in direction. Putting repeated stress on the adductor muscles over a long period of time. Performing vigorous activity without properly stretching or warming up the adductor muscles beforehand. Not being properly conditioned. What are the signs or symptoms? Symptoms of this condition include: Pain and tenderness in the groin area. This begins as sharp pain and persists as a dull ache. A popping or snapping feeling when the injury occurs (for severe strains). Swelling or bruising. Muscle spasms. Weakness in the leg. Stiffness in the groin area with decreased ability to move the affected muscles. How is this diagnosed? This condition may be diagnosed based on: A physical exam. Your medical history. How well you can do certain range of motion exercises. Imaging tests, such as MRI, ultrasound, or X-rays. Your strain may be rated based on how severe it is. The ratings are: Grade 1 strain (mild). Muscles are overstretched. There may be very small muscle tears. This type of strain generally heals in about one week. Grade 2 strain (moderate). Muscles are partially torn. This may take  one to two months to heal. Grade 3 strain (severe). Muscles are completely torn. A severe strain can take more than three months to heal. Grade 3 gluteal strains are rare. How is this treated? An adductor strain will often heal on its own. If needed, this condition may be treated with: PRICE therapy. PRICE stands for protection of the injured area, rest, ice, pressure (compression), and elevation. Medicines to help manage pain and swelling (anti-inflammatory medicines). Crutches. You may be directed to use these for the first few days to minimize your pain. Depending on the severity of the muscle strain, recovery time may vary from a few weeks to several months. Severe injuries often require 4-6 weeks for recovery. In those cases, complete healing can take 4-5 months. Follow these instructions at home: PRICE Therapy  Protect the muscle from being injured again. Rest. Do not use the strained muscle if it causes pain. If directed, put ice on the injured area: Put ice in a plastic bag. Place a towel between your skin and the bag. Leave the ice on for 20 minutes, 2-3 times a day. Do this for the first 2 days after the injury. Apply compression by wrapping the injured area with an elastic bandage as told by your health care provider. Raise (elevate) the injured area above the level of your heart while you are sitting or lying down. Activity Do not drive or use heavy machinery while taking prescription pain medicine. Walk, stretch, and do exercises as told by your health care provider. Only do these activities if you can do so without any pain. General instructions   Take over-the-counter and prescription medicines only as told by your health care provider. Follow your treatment plan as told by your health care provider. This may include: Physical therapy. Massage. Local electrical stimulation (transcutaneous electrical nerve stimulation, TENS). Keep all follow-up visits. This is important. How  is this prevented? Warm up and stretch before being active. Cool down and stretch after being active. Give your body time to rest between periods of activity. Make sure to use equipment that fits you. Be safe and responsible while being active to avoid slips and falls. Maintain physical fitness, including: Proper conditioning in the adductor muscles. Overall strength, flexibility, and endurance. Contact a health care provider if: You have increased pain or swelling in the affected area. Your symptoms are not improving or they are getting worse. Summary An adductor muscle strain, also called a groin strain or pull, is an injury to the muscles or tendons on the upper, inner part of the thigh. A muscle strain occurs when a muscle is overstretched and some muscle fibers are torn. Depending on the severity of the muscle strain, recovery time may vary from a few weeks to several months. This information is not intended to replace advice given to you by your health care provider. Make sure you discuss any questions you have with your health care provider. Document Revised: 01/17/2021 Document Reviewed: 01/17/2021 Elsevier Patient Education  2023 [employer]vier Inc.  

## 2022-07-18 NOTE — Progress Notes (Signed)
   Subjective:    Patient ID: Andrea Ross, female    DOB: Dec 11, 1945, 76 y.o.   MRN: [id number]  Chief Complaint  Patient presents with   Groin Pain    SINCE THEY PUT STENT IN   Pt presents to the office today with right groin pain that radiates down her leg. States this pain started after having a renal artery stent placed on 06/10/22. She reports her pain is a 10 out 10. States the pain is worse when she moves.   She has seen Vascular since. Thought it was possible [facility].  Groin Pain The patient's pertinent negatives include no genital itching or genital lesions.      Review of Systems  All other systems reviewed and are negative.      Objective:   Physical Exam Vitals reviewed.  Constitutional:      General: She is not in acute distress.    Appearance: She is well-developed.  HENT:     Head: Normocephalic and atraumatic.     Right Ear: There is no impacted cerumen.  Eyes:     Pupils: Pupils are equal, round, and reactive to light.  Neck:     Thyroid: No thyromegaly.  Cardiovascular:     Rate and Rhythm: Normal rate and regular rhythm.     Heart sounds: Normal heart sounds. No murmur heard. Pulmonary:     Effort: Pulmonary effort is normal. No respiratory distress.     Breath sounds: Normal breath sounds. No wheezing.  Abdominal:     General: Bowel sounds are normal. There is no distension.     Palpations: Abdomen is soft.     Tenderness: There is no abdominal tenderness.  Musculoskeletal:        General: No tenderness. Normal range of motion.     Cervical back: Normal range of motion and neck supple.     Comments: Full ROM of right hip, no with internal and external rotation. Pain with standing and walking in right groin.   Skin:    General: Skin is warm and dry.  Neurological:     Mental Status: She is alert and oriented to person, place, and time.     Cranial Nerves: No cranial nerve deficit.     Deep Tendon Reflexes: Reflexes are normal and symmetric.   Psychiatric:        Behavior: Behavior normal.        Thought Content: Thought content normal.        Judgment: Judgment normal.      BP (!) 156/74   Pulse 62   Temp 97.7 F (36.5 C) (Temporal)   Ht 5\' 4"  (1.626 m)   SpO2 100%   BMI 22.31 kg/m       Assessment & Plan:   Andrea Ross comes in today with chief complaint of Groin Pain (SINCE THEY PUT STENT IN)   Diagnosis and orders addressed:  1. Right inguinal pain I will given Ultram as needed Recommend follow up with Vascular X-ray pending  Can not do NSAID's because of CKD - traMADol (ULTRAM) 50 MG tablet; Take 1 tablet (50 mg total) by mouth every 6 (six) hours as needed.  Dispense: 30 tablet; Refill: 1 - DG HIP UNILAT W OR W/O PELVIS 2-3 VIEWS RIGHT   2. Chronic kidney disease, unspecified CKD stage  3. Renal artery stenosis ([place])  Evelina Dun, FNP

## 2022-07-19 ENCOUNTER — Ambulatory Visit: Payer: POS | Admitting: Family

## 2022-07-19 ENCOUNTER — Telehealth: Payer: Self-pay | Admitting: Cardiology

## 2022-07-19 MED ORDER — TICAGRELOR 90 MG PO TABS: 90.0000 mg | ORAL_TABLET | Freq: Two times a day (BID) | ORAL | 0 refills | Status: DC

## 2022-07-19 NOTE — Telephone Encounter (Signed)
Sister states that patient is in the donut hole and is unable to afford the Brilinta 90 mg twice a day. Wants to know if there was something cheaper that could be prescribed. Informed the patient and her sister that we can do patient assistance but if approved, it would be only through the remainder of the year and that she would have to reapply in January of next next. Both informed that we can provide samples and if still unable to afford, we can reach out to Dr. Harl Bowie regarding another alternative. Patient agreed and states that she will come by the [facility] office to pick up samples as well as the patient assistance application to complete when needed.

## 2022-07-19 NOTE — Telephone Encounter (Signed)
Pt c/o medication issue:  1. Name of Medication:   ticagrelor (BRILINTA) 90 MG TABS tablet    2. How are you currently taking this medication (dosage and times per day)? Take 1 tablet (90 mg total) by mouth 2 (two) times daily. - Oral   3. Are you having a reaction (difficulty breathing--STAT)?   4. What is your medication issue? Pt sister called in stating this medication is too expensive and wants to try something else cheaper

## 2022-07-26 ENCOUNTER — Other Ambulatory Visit: Payer: Self-pay | Admitting: Family

## 2022-07-26 DIAGNOSIS — E785 Hyperlipidemia, unspecified: Secondary | ICD-10-CM

## 2022-07-27 ENCOUNTER — Other Ambulatory Visit: Payer: Self-pay | Admitting: Family Medicine

## 2022-07-27 DIAGNOSIS — R630 Anorexia: Secondary | ICD-10-CM

## 2022-07-31 ENCOUNTER — Other Ambulatory Visit: Payer: Self-pay

## 2022-07-31 DIAGNOSIS — M25551 Pain in right hip: Secondary | ICD-10-CM

## 2022-07-31 NOTE — Addendum Note (Signed)
Addended by: Kaleen Mask on: 07/31/2022 02:30 PM   Modules accepted: Orders

## 2022-08-19 ENCOUNTER — Encounter: Payer: Self-pay | Admitting: Cardiology

## 2022-08-19 ENCOUNTER — Ambulatory Visit: Payer: POS | Attending: Cardiology | Admitting: Cardiology

## 2022-08-19 VITALS — BP 130/80 | HR 57 | Ht 64.0 in | Wt 131.0 lb

## 2022-08-19 DIAGNOSIS — R0602 Shortness of breath: Secondary | ICD-10-CM | POA: Diagnosis not present

## 2022-08-19 NOTE — Progress Notes (Signed)
Clinical Summary Ms. Varma is a 76 y.o.female seen today for follow up of the following medical problems.   This is a focsued visit on recent issues with SOB.    1.SOB - 06/2022 echo: LVEF 60-65%, no WMAs, grade I dd, normal RV, PASP 19, -normal IVC - CXR no acute process. BNP normal - CT PE negative, lung parenchyma just mild centrilobular changes  - at prior visit breathing gets short with talking or moving around. Fatigue with activities.  - no cough, no wheezing - no swelling, no chest pains - plavix was new after the stent,  -breathing has improved off plavix.    Other medical issues not addressed this visit  1. HTN - long history of HTN, historically has been difficult to control - she also has been noted to have significantly high bp in left arm compared to right, up to 30 mmHg. She denies any arm pain or weakness. Carotid US showed right subclavian stenosis.  - reports medication compliance - normal renin/aldo ratio, normal TSH.   - we decreased her aldactone to 12.32m daily due to increase in Cr.  - home bp log shows bp's 130-140s/70-80s     -seen in HTN clinic - recent stenting to left renal artery, unable to stent right.  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 CKD - followed by Dr BTheador Hawthorne  4.Renal artery stenosis bilateral -followed by vascular, stent to left renal artery 06/2022 -unable to pass wire across right renal artery, could conider repeat in future     5. 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     atorvastatin (LIPITOR) 40 MG tablet TAKE 1 TABLET BY MOUTH EVERY DAY 90 tablet 0   Calcium Carbonate-Vit D-Min (CALCIUM 600+D3 PLUS MINERALS) 600-800 MG-UNIT TABS Take 1 tablet by mouth daily.     chlorthalidone (HYGROTON) 25 MG tablet Take 25 mg by mouth daily.     Cholecalciferol 50 MCG (2000 UT) TABS Take 2,000 Units by mouth daily.     doxazosin (CARDURA) 2 MG tablet Take 1 tablet (2 mg total) by mouth daily. 90 tablet 3   furosemide (LASIX) 20 MG tablet Take 1 tablet (20 mg total) by mouth daily. 30 tablet 11   labetalol (NORMODYNE) 200 MG tablet Take 1 tablet (200 mg total) by mouth 2 (two) times daily. 60 tablet 2   losartan (COZAAR) 25 MG tablet Take 12.5 mg by mouth daily.     mirtazapine (REMERON SOL-TAB) 15 MG disintegrating tablet TAKE 1 TABLET BY MOUTH EVERYDAY AT BEDTIME 90 tablet 0   multivitamin-iron-minerals-folic acid (CENTRUM) chewable tablet Chew 1 tablet by mouth daily.     NIFEdipine (PROCARDIA XL/NIFEDICAL XL) 60 MG 24 hr tablet Take 60 mg by mouth See admin instructions. Take with 30 for a total of 90 mg at bedtime  NIFEdipine (PROCARDIA-XL/NIFEDICAL-XL) 30 MG 24 hr tablet Take 30 mg by mouth See admin instructions. Take with 60 mg for a total of 90 mg at bedtime     spironolactone (ALDACTONE) 25 MG tablet Take 25 mg by mouth daily.     ticagrelor (BRILINTA) 90 MG TABS tablet Take 1 tablet (90 mg total) by mouth 2 (two) times daily. 40 tablet 0   traMADol (ULTRAM) 50 MG tablet Take 1 tablet (50 mg total) by mouth every 6 (six) hours as needed. 30 tablet 1   No current facility-administered medications for this visit.     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   HIATAL HERNIA REPAIR  2004   PERIPHERAL VASCULAR INTERVENTION Left 06/10/2022   Procedure: PERIPHERAL VASCULAR INTERVENTION;   Surgeon: Waynetta Sandy, MD;  Location: [facility];  Service: Cardiovascular;  Laterality: Left;   RENAL ANGIOGRAPHY  06/10/2022   Procedure: RENAL ANGIOGRAPHY;  Surgeon: Waynetta Sandy, MD;  Location: [facility];  Service: Cardiovascular;;   SLT LASER APPLICATION Right 09/26/5807   Procedure: SLT LASER APPLICATION;  Surgeon: Williams Che, MD;  Location: [facility];  Service: Ophthalmology;  Laterality: Right;   UMBILICAL HERNIA REPAIR  1980     Allergies  Allergen Reactions   Sodium Bicarbonate Itching   Pepperoni [Pickled Meat] Swelling   Penicillins Rash      Family History  Problem Relation Age of Onset   Hypertension Mother    Heart failure Mother    Diabetes Mother    Heart disease Mother    Hypertension Father    Cancer Father    Kidney disease Brother    Hypertension Brother    Stroke Brother      Social History Ms. Kjos reports that she quit smoking about 51 years ago. Her smoking use included cigarettes. She started smoking about 52 years ago. She has a 0.08 pack-year smoking history. She has never been exposed to tobacco smoke. She has never used smokeless tobacco. Ms. Ikner reports no history of alcohol use.   Review of Systems CONSTITUTIONAL: No weight loss, fever, chills, weakness or fatigue.  HEENT: Eyes: No visual loss, blurred vision, double vision or yellow sclerae.No hearing loss, sneezing, congestion, runny nose or sore throat.  SKIN: No rash or itching.  CARDIOVASCULAR: no chest pain pain, no palpitations.  RESPIRATORY: No shortness of breath, cough or sputum.  GASTROINTESTINAL: No anorexia, nausea, vomiting or diarrhea. No abdominal pain or blood.  GENITOURINARY: No burning on urination, no polyuria NEUROLOGICAL: No headache, dizziness, syncope, paralysis, ataxia, numbness or tingling in the extremities. No change in bowel or bladder control.  MUSCULOSKELETAL: No muscle, back pain, joint pain or stiffness.   LYMPHATICS: No enlarged nodes. No history of splenectomy.  PSYCHIATRIC: No history of depression or anxiety.  ENDOCRINOLOGIC: No reports of sweating, cold or heat intolerance. No polyuria or polydipsia.  Marland Kitchen   Physical Examination Today's Vitals   08/19/22 1148  BP: 130/80  Pulse: (!) 57  SpO2: 100%  Weight: 131 lb (59.4 kg)  Height: _0  (1.626 m)   Body mass index is 22.49 kg/m.  Gen: resting comfortably, no acute distress HEENT: no scleral icterus, pupils equal round and reactive, no palptable cervical adenopathy,  CV: RRR, no m/r/g, no jvd Resp: Clear to auscultation bilaterally GI: abdomen is soft, non-tender, non-distended, normal bowel sounds, no hepatosplenomegaly MSK:  extremities are warm, no edema.  Skin: warm, no rash Neuro:  no focal deficits Psych: appropriate affect   Diagnostic Studies  06/2015 echo Study Conclusions  - Left ventricle: The cavity size was normal. Wall thickness was   increased in a pattern of mild LVH. Systolic function was normal.   The estimated ejection fraction was in the range of 60% to 65%.   Wall motion was normal; there were no regional wall motion   abnormalities. Doppler parameters are consistent with abnormal   left ventricular relaxation (grade 1 diastolic dysfunction). - Aortic valve: Mildly calcified annulus. Trileaflet; mildly   calcified leaflets. There was trivial regurgitation. - Mitral valve: Calcified annulus. There was trivial regurgitation. - Right atrium: Central venous pressure (est): 3 mm Hg. - Atrial septum: No defect or patent foramen ovale was identified. - Tricuspid valve: There was trivial regurgitation. - Pulmonary arteries: Systolic pressure could not be accurately   estimated. - Pericardium, extracardiac: There was no pericardial effusion.  Impressions:  - Mild LVH with LVEF 60-65% and grade 1 diastolic dysfunction. MAC   with trivial mitral regurgitation. Sclerotic aortic valve without   stenosis,  trivial aortic regurgitation noted.     06/2015 Carotid US 1-39% bilateral ICA diseae, right subclavian stenosis     Assessment and Plan  1. SOB - started after renal artery stenting 10/9 - previously had SOB just talking for periods of time, but also with exertion.  - extensive workup during recent admission with CXR, CT PE, labs, echo as reported above benign.  - her exam is benign - unclear cause of her SOB, timing wise one thing that is new since 10/9 when symptoms started is the plavix. Plavix not common cause of SOB but from review there have been cases.  - SOB has resolved since last visit when we changed plavix to brillinta. Duration of DAPT would depend on vascular for her renal artery stent.       Arnoldo Lenis, M.D.

## 2022-08-19 NOTE — Patient Instructions (Addendum)
Medication Instructions:  Continue all current medications.   Labwork: none  Testing/Procedures: none  Follow-Up: 6 months   Any Other Special Instructions Will Be Listed Below (If Applicable).   If you need a refill on your cardiac medications before your next appointment, please call your pharmacy.  

## 2022-08-28 ENCOUNTER — Telehealth: Payer: Self-pay | Admitting: *Deleted

## 2022-08-28 NOTE — Telephone Encounter (Signed)
Patient informed and verbalized understanding of plan. Says she is taking brilinta

## 2022-08-28 NOTE — Telephone Encounter (Signed)
-----   Message from Arnoldo Lenis, MD sent at 08/27/2022  2:25 PM EST ----- Please let patient know that vascular has recommended continuing brillinta for 3 months which would put [place] into early January. Cost wise is she ok to make it until then, if not do we have some amples to get her there?  Zandra Abts MD ----- Message ----- From: Waynetta Sandy, MD Sent: 08/19/2022  12:52 PM EST To: Arnoldo Lenis, MD  3 months would be ideal but if cost is a real issue then we could settle for 6 weeks as long as she is on aspirin.  bc ----- Message ----- From: Arnoldo Lenis, MD Sent: 08/19/2022  12:32 PM EST To: Waynetta Sandy, MD  Mutual patient you had performed renal artery stenting. Had some SOB after procedure with extensive workup that was benign. SOB not common side effect of plavix but there are reports so we switched to brillinta and SOB resolved. What is the usual duration of DAPT for renal artery stenting that you guys follow, she has asked me in clinic. Our office may need to work with her on the costs of brillinta.    Zandra Abts MD

## 2022-09-05 ENCOUNTER — Ambulatory Visit (INDEPENDENT_AMBULATORY_CARE_PROVIDER_SITE_OTHER): Payer: Commercial Managed Care - PPO | Admitting: Family

## 2022-09-05 ENCOUNTER — Encounter: Payer: Self-pay | Admitting: Family

## 2022-09-05 VITALS — BP 136/84 | HR 67 | Temp 97.4°F | Ht 64.0 in | Wt 134.0 lb

## 2022-09-05 DIAGNOSIS — N184 Chronic kidney disease, stage 4 (severe): Secondary | ICD-10-CM | POA: Diagnosis not present

## 2022-09-05 DIAGNOSIS — I10 Essential (primary) hypertension: Secondary | ICD-10-CM | POA: Diagnosis not present

## 2022-09-05 DIAGNOSIS — I5030 Unspecified diastolic (congestive) heart failure: Secondary | ICD-10-CM

## 2022-09-05 DIAGNOSIS — E559 Vitamin D deficiency, unspecified: Secondary | ICD-10-CM

## 2022-09-05 DIAGNOSIS — R1031 Right lower quadrant pain: Secondary | ICD-10-CM

## 2022-09-05 DIAGNOSIS — M8580 Other specified disorders of bone density and structure, unspecified site: Secondary | ICD-10-CM

## 2022-09-05 DIAGNOSIS — Z79899 Other long term (current) drug therapy: Secondary | ICD-10-CM

## 2022-09-05 DIAGNOSIS — I771 Stricture of artery: Secondary | ICD-10-CM

## 2022-09-05 DIAGNOSIS — I1A0 Resistant hypertension: Secondary | ICD-10-CM

## 2022-09-05 DIAGNOSIS — E785 Hyperlipidemia, unspecified: Secondary | ICD-10-CM

## 2022-09-05 DIAGNOSIS — Z8673 Personal history of transient ischemic attack (TIA), and cerebral infarction without residual deficits: Secondary | ICD-10-CM

## 2022-09-05 LAB — CMP14+EGFR
ALT: 18 IU/L (ref 0–32)
AST: 21 IU/L (ref 0–40)
Albumin/Globulin Ratio: 1.5 (ref 1.2–2.2)
Albumin: 4.3 g/dL (ref 3.8–4.8)
Alkaline Phosphatase: 88 IU/L (ref 44–121)
BUN/Creatinine Ratio: 19 (ref 12–28)
BUN: 35 mg/dL — ABNORMAL HIGH (ref 8–27)
Bilirubin Total: 0.4 mg/dL (ref 0.0–1.2)
CO2: 20 mmol/L (ref 20–29)
Calcium: 9.4 mg/dL (ref 8.7–10.3)
Chloride: 105 mmol/L (ref 96–106)
Creatinine, Ser: 1.88 mg/dL — ABNORMAL HIGH (ref 0.57–1.00)
Globulin, Total: 2.8 g/dL (ref 1.5–4.5)
Glucose: 90 mg/dL (ref 70–99)
Potassium: 4.1 mmol/L (ref 3.5–5.2)
Sodium: 141 mmol/L (ref 134–144)
Total Protein: 7.1 g/dL (ref 6.0–8.5)
eGFR: 27 mL/min/{1.73_m2} — ABNORMAL LOW (ref >59)

## 2022-09-05 LAB — CBC WITH DIFFERENTIAL/PLATELET
Basophils Absolute: 0 10*3/uL (ref 0.0–0.2)
Basos: 0 %
EOS (ABSOLUTE): 0.1 10*3/uL (ref 0.0–0.4)
Eos: 2 %
Hematocrit: 35.3 % (ref 34.0–46.6)
Hemoglobin: 11.4 g/dL (ref 11.1–15.9)
Immature Grans (Abs): 0 10*3/uL (ref 0.0–0.1)
Immature Granulocytes: 0 %
Lymphocytes Absolute: 1.6 10*3/uL (ref 0.7–3.1)
Lymphs: 25 %
MCH: 28.9 pg (ref 26.6–33.0)
MCHC: 32.3 g/dL (ref 31.5–35.7)
MCV: 89 fL (ref 79–97)
Monocytes Absolute: 0.6 10*3/uL (ref 0.1–0.9)
Monocytes: 9 %
Neutrophils Absolute: 4.2 10*3/uL (ref 1.4–7.0)
Neutrophils: 64 %
Platelets: 224 10*3/uL (ref 150–450)
RBC: 3.95 x10E6/uL (ref 3.77–5.28)
RDW: 13.4 % (ref 11.7–15.4)
WBC: 6.6 10*3/uL (ref 3.4–10.8)

## 2022-09-05 MED ORDER — TRAMADOL HCL 50 MG PO TABS: 50.0000 mg | ORAL_TABLET | Freq: Four times a day (QID) | ORAL | 2 refills | Status: DC | PRN

## 2022-09-05 NOTE — Progress Notes (Signed)
Subjective:    Patient ID: Andrea Ross, female    DOB: July 19, 1946, 77 y.o.   MRN: [id number]  Chief Complaint  Patient presents with   Medical Management of Chronic Issues   Pt presents to the office today for chronic follow up and follow up on HTN. Her BP is elevated today, but states her BP at home this morning was 136/84.  She saw HTN clinic, but has not had to go back. She is followed by Cardiologists for CHF.    She has hx of CVA.   She has CKD and is followed by nephrologists.   She has osteopenia and her last dexa scan was 11/29/21. She takes calcium and vit D daily.   She saw vascular for subclavian stenosis, and had stent placed. Reports she continues to have intermittent groin pain. She was cleared by Vascular and thought it was MSK. PT reports this pain when it occurs can be a 10 out 10 and can bring her to her knees.   Hyperlipidemia This is a chronic problem. The current episode started more than 1 year ago. The problem is controlled. Recent lipid tests were reviewed and are normal. Pertinent negatives include no shortness of breath. Current antihyperlipidemic treatment includes statins. The current treatment provides moderate improvement of lipids. Risk factors for coronary artery disease include dyslipidemia, hypertension, post-menopausal and a sedentary lifestyle.  Congestive Heart Failure Presents for follow-up visit. Associated symptoms include edema and fatigue. Pertinent negatives include no shortness of breath. The symptoms have been stable.  Hypertension This is a chronic problem. The current episode started more than 1 year ago. The problem has been resolved since onset. The problem is controlled. Pertinent negatives include no malaise/fatigue, peripheral edema or shortness of breath. Past treatments include beta blockers, calcium channel blockers and diuretics. The current treatment provides mild improvement. Hypertensive end-organ damage includes CVA.   Current  opioids rx- Ultram 50 mg  # meds rx- 30 Effectiveness of current meds- Stable Adverse reactions from pain meds-none Morphine equivalent- 20  Pill count performed-No Last drug screen - 09/05/21 ( high risk q43m moderate risk q636mlow risk yearly ) Urine drug screen today- Yes [place]  If yes were their any concerning findings? - none   Pain contract signed on:09/05/21    Review of Systems  Constitutional:  Positive for fatigue. Negative for malaise/fatigue.  Respiratory:  Negative for shortness of breath.   All other systems reviewed and are negative.      Objective:   Physical Exam Vitals reviewed.  Constitutional:      General: She is not in acute distress.    Appearance: She is well-developed.  HENT:     Head: Normocephalic and atraumatic.     Right Ear: Tympanic membrane normal.     Left Ear: Tympanic membrane normal.  Eyes:     Pupils: Pupils are equal, round, and reactive to light.  Neck:     Thyroid: No thyromegaly.  Cardiovascular:     Rate and Rhythm: Normal rate and regular rhythm.     Heart sounds: Normal heart sounds. No murmur heard. Pulmonary:     Effort: Pulmonary effort is normal. No respiratory distress.     Breath sounds: Normal breath sounds. No wheezing.  Abdominal:     General: Bowel sounds are normal. There is no distension.     Palpations: Abdomen is soft.     Tenderness: There is no abdominal tenderness.  Musculoskeletal:  General: No tenderness. Normal range of motion.     Cervical back: Normal range of motion and neck supple.  Skin:    General: Skin is warm and dry.  Neurological:     Mental Status: She is alert and oriented to person, place, and time.     Cranial Nerves: No cranial nerve deficit.     Deep Tendon Reflexes: Reflexes are normal and symmetric.  Psychiatric:        Behavior: Behavior normal.        Thought Content: Thought content normal.        Judgment: Judgment normal.       BP (!)  154/68   Pulse 67   Temp (!) 97.4 F (36.3 C) (Temporal)   Ht _0  (1.626 m)   Wt 134 lb (60.8 kg)   SpO2 96%   BMI 23.00 kg/m      Assessment & Plan:  Andrea Ross comes in today with chief complaint of Medical Management of Chronic Issues   Diagnosis and orders addressed:  1. Right inguinal pain - traMADol (ULTRAM) 50 MG tablet; Take 1 tablet (50 mg total) by mouth every 6 (six) hours as needed.  Dispense: 30 tablet; Refill: 2 - ToxASSURE Select 13 (MW), Urine - CMP14+EGFR - CBC with Differential/Platelet  2. Benign essential hypertension - CMP14+EGFR - CBC with Differential/Platelet  3. Chronic kidney disease (CKD), stage IV (severe) (HCC) - CMP14+EGFR - CBC with Differential/Platelet  4. Diastolic congestive heart failure, NYHA class 1, unspecified congestive heart failure chronicity (HCC) - CMP14+EGFR - CBC with Differential/Platelet  5. History of cardioembolic cerebrovascular accident (CVA) - CMP14+EGFR - CBC with Differential/Platelet  6. Hyperlipidemia, unspecified hyperlipidemia type - CMP14+EGFR - CBC with Differential/Platelet  7. Resistant hypertension - CMP14+EGFR - CBC with Differential/Platelet  8. Osteopenia, unspecified location - CMP14+EGFR - CBC with Differential/Platelet  9. Subclavian arterial stenosis (HCC) - CMP14+EGFR - CBC with Differential/Platelet  10. Vitamin D deficiency - CMP14+EGFR - CBC with Differential/Platelet  11. Controlled substance agreement signed - traMADol (ULTRAM) 50 MG tablet; Take 1 tablet (50 mg total) by mouth every 6 (six) hours as needed.  Dispense: 30 tablet; Refill: 2 - ToxASSURE Select 13 (MW), Urine - CMP14+EGFR - CBC with Differential/Platelet   Labs pending Patient reviewed in  controlled database, no flags noted. Contract and drug screen are up to date.  Health Maintenance reviewed Diet and exercise encouraged  Follow up plan: 3 months   Evelina Dun, FNP

## 2022-09-05 NOTE — Patient Instructions (Signed)
Hypertension, Adult High blood pressure (hypertension) is when the force of blood pumping through the arteries is too strong. The arteries are the blood vessels that carry blood from the heart throughout the body. Hypertension forces the heart to work harder to pump blood and may cause arteries to become narrow or stiff. Untreated or uncontrolled hypertension can lead to a heart attack, heart failure, a stroke, kidney disease, and other problems. A blood pressure reading consists of a higher number over a lower number. Ideally, your blood pressure should be below 120/80. The first ("top") number is called the systolic pressure. It is a measure of the pressure in your arteries as your heart beats. The second ("bottom") number is called the diastolic pressure. It is a measure of the pressure in your arteries as the heart relaxes. What are the causes? The exact cause of this condition is not known. There are some conditions that result in high blood pressure. What increases the risk? Certain factors may make you more likely to develop high blood pressure. Some of these risk factors are under your control, including: Smoking. Not getting enough exercise or physical activity. Being overweight. Having too much fat, sugar, calories, or salt (sodium) in your diet. Drinking too much alcohol. Other risk factors include: Having a personal history of heart disease, diabetes, high cholesterol, or kidney disease. Stress. Having a family history of high blood pressure and high cholesterol. Having obstructive sleep apnea. Age. The risk increases with age. What are the signs or symptoms? High blood pressure may not cause symptoms. Very high blood pressure (hypertensive crisis) may cause: Headache. Fast or irregular heartbeats (palpitations). Shortness of breath. Nosebleed. Nausea and vomiting. Vision changes. Severe chest pain, dizziness, and seizures. How is this diagnosed? This condition is diagnosed by  measuring your blood pressure while you are seated, with your arm resting on a flat surface, your legs uncrossed, and your feet flat on the floor. The cuff of the blood pressure monitor will be placed directly against the skin of your upper arm at the level of your heart. Blood pressure should be measured at least twice using the same arm. Certain conditions can cause a difference in blood pressure between your right and left arms. If you have a high blood pressure reading during one visit or you have normal blood pressure with other risk factors, you may be asked to: Return on a different day to have your blood pressure checked again. Monitor your blood pressure at home for 1 week or longer. If you are diagnosed with hypertension, you may have other blood or imaging tests to help your health care provider understand your overall risk for other conditions. How is this treated? This condition is treated by making healthy lifestyle changes, such as eating healthy foods, exercising more, and reducing your alcohol intake. You may be referred for counseling on a healthy diet and physical activity. Your health care provider may prescribe medicine if lifestyle changes are not enough to get your blood pressure under control and if: Your systolic blood pressure is above 130. Your diastolic blood pressure is above 80. Your personal target blood pressure may vary depending on your medical conditions, your age, and other factors. Follow these instructions at home: Eating and drinking  Eat a diet that is high in fiber and potassium, and low in sodium, added sugar, and fat. An example of this eating plan is called the DASH diet. DASH stands for Dietary Approaches to Stop Hypertension. To eat this way: Eat   plenty of fresh fruits and vegetables. Try to fill one half of your plate at each meal with fruits and vegetables. Eat whole grains, such as whole-wheat pasta, brown rice, or whole-grain bread. Fill about one  fourth of your plate with whole grains. Eat or drink low-fat dairy products, such as skim milk or low-fat yogurt. Avoid fatty cuts of meat, processed or cured meats, and poultry with skin. Fill about one fourth of your plate with lean proteins, such as fish, chicken without skin, beans, eggs, or tofu. Avoid pre-made and processed foods. These tend to be higher in sodium, added sugar, and fat. Reduce your daily sodium intake. Many people with hypertension should eat less than 1,500 mg of sodium a day. Do not drink alcohol if: Your health care provider tells you not to drink. You are pregnant, may be pregnant, or are planning to become pregnant. If you drink alcohol: Limit how much you have to: 0-1 drink a day for women. 0-2 drinks a day for men. Know how much alcohol is in your drink. In the [place] one drink equals one 12 oz bottle of beer (355 mL), one 5 oz glass of wine (148 mL), or one 1 oz glass of hard liquor (44 mL). Lifestyle  Work with your health care provider to maintain a healthy body weight or to lose weight. Ask what an ideal weight is for you. Get at least 30 minutes of exercise that causes your heart to beat faster (aerobic exercise) most days of the week. Activities may include walking, swimming, or biking. Include exercise to strengthen your muscles (resistance exercise), such as Pilates or lifting weights, as part of your weekly exercise routine. Try to do these types of exercises for 30 minutes at least 3 days a week. Do not use any products that contain nicotine or tobacco. These products include cigarettes, chewing tobacco, and vaping devices, such as e-cigarettes. If you need help quitting, ask your health care provider. Monitor your blood pressure at home as told by your health care provider. Keep all follow-up visits. This is important. Medicines Take over-the-counter and prescription medicines only as told by your health care provider. Follow directions carefully. Blood  pressure medicines must be taken as prescribed. Do not skip doses of blood pressure medicine. Doing this puts you at risk for problems and can make the medicine less effective. Ask your health care provider about side effects or reactions to medicines that you should watch for. Contact a health care provider if you: Think you are having a reaction to a medicine you are taking. Have headaches that keep coming back (recurring). Feel dizzy. Have swelling in your ankles. Have trouble with your vision. Get help right away if you: Develop a severe headache or confusion. Have unusual weakness or numbness. Feel faint. Have severe pain in your chest or abdomen. Vomit repeatedly. Have trouble breathing. These symptoms may be an emergency. Get help right away. Call 911. Do not wait to see if the symptoms will go away. Do not drive yourself to the hospital. Summary Hypertension is when the force of blood pumping through your arteries is too strong. If this condition is not controlled, it may put you at risk for serious complications. Your personal target blood pressure may vary depending on your medical conditions, your age, and other factors. For most people, a normal blood pressure is less than 120/80. Hypertension is treated with lifestyle changes, medicines, or a combination of both. Lifestyle changes include losing weight, eating a healthy,   low-sodium diet, exercising more, and limiting alcohol. This information is not intended to replace advice given to you by your health care provider. Make sure you discuss any questions you have with your health care provider. Document Revised: 06/26/2021 Document Reviewed: 06/26/2021 Elsevier Patient Education  2023 Elsevier Inc.  

## 2022-09-06 ENCOUNTER — Other Ambulatory Visit: Payer: Self-pay | Admitting: Family

## 2022-09-10 LAB — TOXASSURE SELECT 13 (MW), URINE

## 2022-09-12 ENCOUNTER — Encounter: Payer: Self-pay | Admitting: *Deleted

## 2022-10-08 ENCOUNTER — Other Ambulatory Visit: Payer: Commercial Managed Care - PPO

## 2022-10-28 ENCOUNTER — Telehealth: Payer: Self-pay | Admitting: Family

## 2022-10-28 DIAGNOSIS — R1031 Right lower quadrant pain: Secondary | ICD-10-CM

## 2022-10-29 NOTE — Telephone Encounter (Signed)
Called and spoke with patient states that she is still having the bad pain in groin where they put stent in right side. She has seen you for this before. Its when she lays or sits and has to get back up it causes the pain and she has to use can. Only wants to speak with [facility] about this. Please advise.

## 2022-10-29 NOTE — Telephone Encounter (Signed)
LMTCB

## 2022-10-29 NOTE — Telephone Encounter (Signed)
Patient aware and verbalized understanding. °

## 2022-10-29 NOTE — Telephone Encounter (Signed)
I have placed a referral to Ortho. Pain could be from hip.

## 2022-10-30 ENCOUNTER — Other Ambulatory Visit: Payer: Self-pay | Admitting: Family Medicine

## 2022-10-30 DIAGNOSIS — R630 Anorexia: Secondary | ICD-10-CM

## 2022-11-05 ENCOUNTER — Ambulatory Visit: Payer: 59 | Admitting: Orthopaedic Surgery

## 2022-11-05 ENCOUNTER — Encounter: Payer: Self-pay | Admitting: Orthopaedic Surgery

## 2022-11-05 ENCOUNTER — Ambulatory Visit (INDEPENDENT_AMBULATORY_CARE_PROVIDER_SITE_OTHER): Payer: 59

## 2022-11-05 VITALS — BP 119/75 | HR 75 | Ht 64.5 in | Wt 134.0 lb

## 2022-11-05 DIAGNOSIS — M545 Low back pain, unspecified: Secondary | ICD-10-CM | POA: Diagnosis not present

## 2022-11-05 DIAGNOSIS — M25551 Pain in right hip: Secondary | ICD-10-CM | POA: Diagnosis not present

## 2022-11-05 DIAGNOSIS — M79604 Pain in right leg: Secondary | ICD-10-CM | POA: Diagnosis not present

## 2022-11-05 NOTE — Progress Notes (Signed)
Subjective:    Patient ID: Andrea Ross, female    DOB: [birth year], 77 y.o.   MRN: [id number]  HPI She had a stent placed in the right groin in November. She has had pain in the right hip since then. She says she has pain running from her lower back, past the buttocks to the right posterior thigh to the right foot.  She thinks it is related to the hip. She has full motion of the hip.  She uses a cane. She has no trauma. She has taken Tylenol and used heat with slight relief. She is on Tramadol also and has seen her family doctor for this as well.    Review of Systems  Constitutional:  Positive for activity change.  Musculoskeletal:  Positive for arthralgias, gait problem and myalgias.  All other systems reviewed and are negative. For Review of Systems, all other systems reviewed and are negative.  The following is a summary of the past history medically, past history surgically, known current medicines, social history and family history.  This information is gathered electronically by the computer from prior information and documentation.  I review this each visit and have found including this information at this point in the chart is beneficial and informative.   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   HIATAL HERNIA REPAIR  2004   PERIPHERAL VASCULAR INTERVENTION Left 06/10/2022   Procedure: PERIPHERAL VASCULAR INTERVENTION;  Surgeon: Waynetta Sandy, MD;  Location: [facility];  Service: Cardiovascular;  Laterality: Left;   RENAL ANGIOGRAPHY  06/10/2022   Procedure: RENAL ANGIOGRAPHY;  Surgeon: Waynetta Sandy, MD;  Location: [facility];  Service: Cardiovascular;;   SLT LASER APPLICATION Right 0000000   Procedure: SLT LASER APPLICATION;  Surgeon: Williams Che, MD;  Location: [facility];  Service: Ophthalmology;  Laterality: Right;   UMBILICAL HERNIA REPAIR  1980    Current Outpatient Medications on File Prior to Visit  Medication Sig Dispense Refill   aspirin 81 MG chewable tablet Chew 81 mg by mouth daily.     atorvastatin (LIPITOR) 40 MG tablet TAKE 1 TABLET BY MOUTH EVERY DAY 90 tablet 0   Calcium Carbonate-Vit D-Min (CALCIUM 600+D3 PLUS MINERALS) 600-800 MG-UNIT TABS Take 1 tablet by mouth daily.     chlorthalidone (HYGROTON) 25 MG tablet Take by mouth.     Cholecalciferol 50 MCG (2000 UT) TABS Take 2,000 Units by mouth daily.     cloNIDine (CATAPRES) 0.1 MG tablet Take 0.1 mg by mouth 2 (two) times daily.     cyanocobalamin (VITAMIN B12) 1000 MCG tablet Take by mouth.     doxazosin (CARDURA) 2 MG tablet Take 2 mg by mouth daily.     furosemide (LASIX) 20 MG tablet Take 20 mg by mouth.     labetalol (NORMODYNE) 200 MG tablet Take 200 mg by mouth 2 (two) times daily.     losartan (COZAAR) 25 MG tablet Take 12.5 mg by mouth daily.     multivitamin-iron-minerals-folic acid (CENTRUM) chewable tablet Chew 1 tablet by mouth daily.     NIFEdipine (  PROCARDIA XL/NIFEDICAL XL) 60 MG 24 hr tablet Take 60 mg by mouth See admin instructions. Take with 30 for a total of 90 mg at bedtime     spironolactone (ALDACTONE) 25 MG tablet Take 25 mg by mouth daily.     ticagrelor (BRILINTA) 90 MG TABS tablet Take 1 tablet (90 mg total) by mouth 2 (two) times daily. 40 tablet 0   traMADol (ULTRAM) 50 MG tablet Take 1 tablet (50 mg total) by mouth every 6 (six) hours as needed. 30 tablet 2   No current facility-administered medications on file prior to visit.    Social History   Socioeconomic History   Marital status: Widowed    Spouse name: Not on file   Number of children: 3   Years of education: Not on file    Highest education level: Not on file  Occupational History   Not on file  Tobacco Use   Smoking status: Former    Packs/day: 0.25    Years: 0.30    Total pack years: 0.08    Types: Cigarettes    Start date: 07/03/1970    Quit date: 09/02/1970    Years since quitting: 52.2    Passive exposure: Never   Smokeless tobacco: Never  Vaping Use   Vaping Use: Never used  Substance and Sexual Activity   Alcohol use: No    Alcohol/week: 0.0 standard drinks of alcohol   Drug use: No   Sexual activity: Not on file  Other Topics Concern   Not on file  Social History Narrative   Not on file   Social Determinants of Health   Financial Resource Strain: Low Risk  (04/01/2022)   Overall Financial Resource Strain (CARDIA)    Difficulty of Paying Living Expenses: Not hard at all  Food Insecurity: No Food Insecurity (04/01/2022)   Hunger Vital Sign    Worried About Running Out of Food in the Last Year: Never true    Ran Out of Food in the Last Year: Never true  Transportation Needs: No Transportation Needs (04/01/2022)   PRAPARE - Hydrologist (Medical): No    Lack of Transportation (Non-Medical): No  Physical Activity: Inactive (04/01/2022)   Exercise Vital Sign    Days of Exercise per Week: 0 days    Minutes of Exercise per Session: 0 min  Stress: Not on file  Social Connections: Not on file  Intimate Partner Violence: Not on file    Family History  Problem Relation Age of Onset   Hypertension Mother    Heart failure Mother    Diabetes Mother    Heart disease Mother    Hypertension Father    Cancer Father    Kidney disease Brother    Hypertension Brother    Stroke Brother     BP 119/75   Pulse 75   Ht 5' 4.5" (1.638 m)   Wt 134 lb (60.8 kg)   BMI 22.65 kg/m   Body mass index is 22.65 kg/m.      Objective:   Physical Exam Vitals and nursing note reviewed. Exam conducted with a chaperone present.  Constitutional:      Appearance: She is  well-developed.  HENT:     Head: Normocephalic and atraumatic.  Eyes:     Conjunctiva/sclera: Conjunctivae normal.     Pupils: Pupils are equal, round, and reactive to light.  Cardiovascular:     Rate and Rhythm: Normal rate and regular rhythm.  Pulmonary:  Effort: Pulmonary effort is normal.  Abdominal:     Palpations: Abdomen is soft.  Musculoskeletal:     Cervical back: Normal range of motion and neck supple.       Legs:  Skin:    General: Skin is warm and dry.  Neurological:     Mental Status: She is alert and oriented to person, place, and time.     Cranial Nerves: No cranial nerve deficit.     Motor: No abnormal muscle tone.     Coordination: Coordination normal.     Deep Tendon Reflexes: Reflexes are normal and symmetric. Reflexes normal.  Psychiatric:        Behavior: Behavior normal.        Thought Content: Thought content normal.        Judgment: Judgment normal.   X-rays were done of the lumbar spine, reported separately.        Assessment & Plan:   Encounter Diagnoses  Name Primary?   Pain in right hip    Lumbar pain with radiation down right leg Yes   I have told her the problem is from the back. She has a spondylolisthesis.  I will begin Aleve one twice a day, increase to two twice a day if not better.  Return in two weeks.  Continue her Tramadol.  She may need MRI for the spondylolisthesis.  Call if any problem.  Precautions discussed.  Electronically Signed Sanjuana Kava, MD 3/5/202410:53 AM

## 2022-11-05 NOTE — Patient Instructions (Addendum)
FOLLOW UP: 2 WEEKS. PROVIDE PATIENT A BUSINESS CARD OF DR.KEELINGS  You have been prescribed Naproxen '500mg'$  by mouth twice daily. You must take this medication WITH food. If you only have coffee for breakfast that isn't a meal. Wait until you eat. It will take about 10 days for this to fully get into your system.   DO THE BACK EXERCISES PROVIDED TO YOU DURING YOUR VISIT TWICE DAILY.  DR.KEELING'S SCHEDULE IS AS FOLLOWS: TUESDAY: ALL DAY WEDNESDAY: MORNING ONLY THURSDAY: MORNING ONLY PLEASE CALL OR SEND A MESSAGE VIA MYCHART BY WEDNESDAY MORNING SO THAT I CAN SEND A REQUEST TO HIM. HE LEAVES THURSDAY BY 11:30AM. HE DOES NOT RETURN TO THE OFFICE UNTIL TUESDAY MORNINGS AND HE DOES NOT CHECK HIS WORK MESSAGES DURING HIS TIME AWAY FROM THE OFFICE. I'M ABBY AND IF YOU NEED SOMETHING, SEND ME A MYCHART MESSAGE OR CALL. MYCHART IS THE FASTEST WAY TO GET IN CONTACT WITH ME.   Aspercreme, Biofreeze, Blue Emu or Voltaren Gel over the counter 2-3 times daily. Rub into area well each use for best results.

## 2022-11-06 ENCOUNTER — Ambulatory Visit: Payer: POS | Admitting: Orthopedic Surgery

## 2022-11-07 ENCOUNTER — Other Ambulatory Visit: Payer: Self-pay | Admitting: Family

## 2022-11-07 DIAGNOSIS — N184 Chronic kidney disease, stage 4 (severe): Secondary | ICD-10-CM

## 2022-11-07 DIAGNOSIS — I10 Essential (primary) hypertension: Secondary | ICD-10-CM

## 2022-11-07 NOTE — Telephone Encounter (Signed)
Last office visit 09/05/22 Upcoming appointment 12/03/22 Medication listed on med list as historical, requires provider approval

## 2022-11-19 ENCOUNTER — Encounter: Payer: Self-pay | Admitting: Orthopaedic Surgery

## 2022-11-19 ENCOUNTER — Ambulatory Visit: Payer: 59 | Admitting: Orthopaedic Surgery

## 2022-11-19 VITALS — BP 155/100 | HR 91 | Ht 64.5 in | Wt 134.0 lb

## 2022-11-19 DIAGNOSIS — M4316 Spondylolisthesis, lumbar region: Secondary | ICD-10-CM | POA: Diagnosis not present

## 2022-11-19 DIAGNOSIS — M79604 Pain in right leg: Secondary | ICD-10-CM

## 2022-11-19 NOTE — Progress Notes (Signed)
I am worse.  Her lower back is more painful.  She has right sided sciatica that is getting more painful.  She has no new trauma.  She has slight weakness on the right and uses a cane.    Spine/Pelvis examination:  Inspection:  Overall, sacoiliac joint benign and hips nontender; without crepitus or defects.   Thoracic spine inspection: Alignment normal without kyphosis present   Lumbar spine inspection:  Alignment  with normal lumbar lordosis, without scoliosis apparent.   Thoracic spine palpation:  without tenderness of spinal processes   Lumbar spine palpation: with tenderness of lumbar area; with tightness of lumbar muscles    Range of Motion:   Lumbar flexion, forward flexion is 30 with pain or tenderness    Lumbar extension is 5 with pain or tenderness   Left lateral bend is Abnormal- 5   with pain or tenderness   Right lateral bend is Abnormal- 5  with pain or tenderness   Straight leg raising is Abnormal- 30   Strength & tone: Normal   Stability overall normal stability    Encounter Diagnoses  Name Primary?   Lumbar pain with radiation down right leg Yes   Spondylolisthesis, lumbar region    I will get MRI of the lumbar spine.  Return in two weeks.  Call if any problem.  Precautions discussed.  Electronically Signed Sanjuana Kava, MD 3/19/20249:43 AM

## 2022-11-19 NOTE — Patient Instructions (Signed)
While we are working on your approval for MRI please go ahead and call to schedule your appointment with [facility] within at least one (1) week.   Central Scheduling [phone]  

## 2022-12-03 ENCOUNTER — Encounter: Payer: Self-pay | Admitting: Family

## 2022-12-03 ENCOUNTER — Ambulatory Visit (INDEPENDENT_AMBULATORY_CARE_PROVIDER_SITE_OTHER): Payer: 59 | Admitting: Family

## 2022-12-03 VITALS — BP 132/69 | HR 86 | Temp 98.0°F | Ht 64.5 in | Wt 136.0 lb

## 2022-12-03 DIAGNOSIS — I1A0 Resistant hypertension: Secondary | ICD-10-CM | POA: Diagnosis not present

## 2022-12-03 DIAGNOSIS — Z0000 Encounter for general adult medical examination without abnormal findings: Secondary | ICD-10-CM

## 2022-12-03 DIAGNOSIS — Z8673 Personal history of transient ischemic attack (TIA), and cerebral infarction without residual deficits: Secondary | ICD-10-CM

## 2022-12-03 DIAGNOSIS — Z79899 Other long term (current) drug therapy: Secondary | ICD-10-CM

## 2022-12-03 DIAGNOSIS — M8580 Other specified disorders of bone density and structure, unspecified site: Secondary | ICD-10-CM

## 2022-12-03 DIAGNOSIS — R1031 Right lower quadrant pain: Secondary | ICD-10-CM

## 2022-12-03 DIAGNOSIS — I5030 Unspecified diastolic (congestive) heart failure: Secondary | ICD-10-CM

## 2022-12-03 DIAGNOSIS — E785 Hyperlipidemia, unspecified: Secondary | ICD-10-CM | POA: Diagnosis not present

## 2022-12-03 DIAGNOSIS — N184 Chronic kidney disease, stage 4 (severe): Secondary | ICD-10-CM

## 2022-12-03 DIAGNOSIS — I10 Essential (primary) hypertension: Secondary | ICD-10-CM

## 2022-12-03 DIAGNOSIS — E559 Vitamin D deficiency, unspecified: Secondary | ICD-10-CM

## 2022-12-03 MED ORDER — TRAMADOL HCL 50 MG PO TABS: 50.0000 mg | ORAL_TABLET | Freq: Four times a day (QID) | ORAL | 2 refills | Status: DC | PRN

## 2022-12-03 NOTE — Progress Notes (Signed)
Subjective:    Patient ID: Andrea Ross, female    DOB: [birth year], 77 y.o.   MRN: [id number]  Chief Complaint  Patient presents with   Medical Management of Chronic Issues   Pt presents to the office today for chronic follow up and follow up on HTN. Her BP is at goal today.  She saw HTN clinic, but has not had to go back. She is followed by Cardiologists for CHF.    She has hx of CVA.    She has CKD and is followed by nephrologists. Avoids NSAID's, this is stable.    She has osteopenia and her last dexa scan was 11/29/21. She takes calcium and vit D daily.    She saw vascular for subclavian stenosis, and had stent placed. Reports she continues to have intermittent groin pain. She was cleared by Vascular and thought it was MSK. PT reports this pain when it occurs can be a 10 out 10 and can bring her to her knees.  She is scheduled for a MRI spine 12/17/22 to see if this is causing her pain. She is followed by Ortho.  Hypertension This is a chronic problem. The current episode started more than 1 year ago. The problem has been resolved since onset. The problem is controlled. Pertinent negatives include no malaise/fatigue, peripheral edema or shortness of breath. Risk factors for coronary artery disease include dyslipidemia. The current treatment provides moderate improvement.  Hyperlipidemia This is a chronic problem. The current episode started more than 1 year ago. The problem is uncontrolled. Factors aggravating her hyperlipidemia include fatty foods. Pertinent negatives include no shortness of breath. Current antihyperlipidemic treatment includes statins. The current treatment provides moderate improvement of lipids. Risk factors for coronary artery disease include dyslipidemia, hypertension, a sedentary lifestyle and post-menopausal.  Congestive Heart Failure Presents for follow-up visit. Pertinent negatives include no edema, fatigue or shortness of breath. The symptoms have been stable.    Current opioids rx- Ultram 50 mg  # meds rx- 30 Effectiveness of current meds- Stable Adverse reactions from pain meds-none Morphine equivalent- 20   Pill count performed-No Last drug screen - 09/05/21 ( high risk q28m, moderate risk q40m, low risk yearly ) Urine drug screen today- Yes Was the [place] reviewed- yes             If yes were their any concerning findings? - none     Pain contract signed on:09/05/21   Review of Systems  Constitutional:  Negative for fatigue and malaise/fatigue.  Respiratory:  Negative for shortness of breath.   All other systems reviewed and are negative.      Objective:   Physical Exam Vitals reviewed.  Constitutional:      General: She is not in acute distress.    Appearance: She is well-developed.  HENT:     Head: Normocephalic and atraumatic.     Right Ear: Tympanic membrane normal.     Left Ear: Tympanic membrane normal.  Eyes:     Pupils: Pupils are equal, round, and reactive to light.  Neck:     Thyroid: No thyromegaly.  Cardiovascular:     Rate and Rhythm: Normal rate and regular rhythm.     Heart sounds: Normal heart sounds. No murmur heard. Pulmonary:     Effort: Pulmonary effort is normal. No respiratory distress.     Breath sounds: Normal breath sounds. No wheezing.  Abdominal:     General: Bowel sounds are normal. There is no distension.  Palpations: Abdomen is soft.     Tenderness: There is no abdominal tenderness.  Musculoskeletal:        General: No tenderness. Normal range of motion.     Cervical back: Normal range of motion and neck supple.  Skin:    General: Skin is warm and dry.  Neurological:     Mental Status: She is alert and oriented to person, place, and time.     Cranial Nerves: No cranial nerve deficit.     Deep Tendon Reflexes: Reflexes are normal and symmetric.  Psychiatric:        Behavior: Behavior normal.        Thought Content: Thought content normal.        Judgment: Judgment normal.        BP 132/69   Pulse 86   Temp 98 F (36.7 C) (Temporal)   Ht 5' 4.5" (1.638 m)   Wt 136 lb (61.7 kg)   SpO2 97%   BMI 22.98 kg/m      Assessment & Plan:  Andrea Ross comes in today with chief complaint of Medical Management of Chronic Issues   Diagnosis and orders addressed:  1. Right inguinal pain - traMADol (ULTRAM) 50 MG tablet; Take 1 tablet (50 mg total) by mouth every 6 (six) hours as needed.  Dispense: 30 tablet; Refill: 2 - CMP14+EGFR - CBC with Differential/Platelet  2. Controlled substance agreement signed - traMADol (ULTRAM) 50 MG tablet; Take 1 tablet (50 mg total) by mouth every 6 (six) hours as needed.  Dispense: 30 tablet; Refill: 2 - CMP14+EGFR - CBC with Differential/Platelet  3. Resistant hypertension - CMP14+EGFR - CBC with Differential/Platelet  4. Hyperlipidemia, unspecified hyperlipidemia type - CMP14+EGFR - CBC with Differential/Platelet - Lipid panel  5. Vitamin D deficiency - CMP14+EGFR - CBC with Differential/Platelet  6. History of cardioembolic cerebrovascular accident (CVA) - CMP14+EGFR - CBC with Differential/Platelet  7. Osteopenia, unspecified location - CMP14+EGFR - CBC with Differential/Platelet  8. Chronic kidney disease (CKD), stage IV (severe) - CMP14+EGFR - CBC with Differential/Platelet  9. Benign essential hypertension - CMP14+EGFR - CBC with Differential/Platelet  10. Diastolic congestive heart failure, NYHA class 1, unspecified congestive heart failure chronicity - CMP14+EGFR - CBC with Differential/Platelet  11. Annual physical exam - CMP14+EGFR - CBC with Differential/Platelet - Lipid panel - TSH   Labs pending Continue current meds- low fat diet and exercise and recheck in 3 months Health Maintenance reviewed Diet and exercise encouraged  Follow up plan: 3 months    Evelina Dun, FNP

## 2022-12-03 NOTE — Patient Instructions (Signed)
Hypertension, Adult High blood pressure (hypertension) is when the force of blood pumping through the arteries is too strong. The arteries are the blood vessels that carry blood from the heart throughout the body. Hypertension forces the heart to work harder to pump blood and may cause arteries to become narrow or stiff. Untreated or uncontrolled hypertension can lead to a heart attack, heart failure, a stroke, kidney disease, and other problems. A blood pressure reading consists of a higher number over a lower number. Ideally, your blood pressure should be below 120/80. The first ("top") number is called the systolic pressure. It is a measure of the pressure in your arteries as your heart beats. The second ("bottom") number is called the diastolic pressure. It is a measure of the pressure in your arteries as the heart relaxes. What are the causes? The exact cause of this condition is not known. There are some conditions that result in high blood pressure. What increases the risk? Certain factors may make you more likely to develop high blood pressure. Some of these risk factors are under your control, including: Smoking. Not getting enough exercise or physical activity. Being overweight. Having too much fat, sugar, calories, or salt (sodium) in your diet. Drinking too much alcohol. Other risk factors include: Having a personal history of heart disease, diabetes, high cholesterol, or kidney disease. Stress. Having a family history of high blood pressure and high cholesterol. Having obstructive sleep apnea. Age. The risk increases with age. What are the signs or symptoms? High blood pressure may not cause symptoms. Very high blood pressure (hypertensive crisis) may cause: Headache. Fast or irregular heartbeats (palpitations). Shortness of breath. Nosebleed. Nausea and vomiting. Vision changes. Severe chest pain, dizziness, and seizures. How is this diagnosed? This condition is diagnosed by  measuring your blood pressure while you are seated, with your arm resting on a flat surface, your legs uncrossed, and your feet flat on the floor. The cuff of the blood pressure monitor will be placed directly against the skin of your upper arm at the level of your heart. Blood pressure should be measured at least twice using the same arm. Certain conditions can cause a difference in blood pressure between your right and left arms. If you have a high blood pressure reading during one visit or you have normal blood pressure with other risk factors, you may be asked to: Return on a different day to have your blood pressure checked again. Monitor your blood pressure at home for 1 week or longer. If you are diagnosed with hypertension, you may have other blood or imaging tests to help your health care provider understand your overall risk for other conditions. How is this treated? This condition is treated by making healthy lifestyle changes, such as eating healthy foods, exercising more, and reducing your alcohol intake. You may be referred for counseling on a healthy diet and physical activity. Your health care provider may prescribe medicine if lifestyle changes are not enough to get your blood pressure under control and if: Your systolic blood pressure is above 130. Your diastolic blood pressure is above 80. Your personal target blood pressure may vary depending on your medical conditions, your age, and other factors. Follow these instructions at home: Eating and drinking  Eat a diet that is high in fiber and potassium, and low in sodium, added sugar, and fat. An example of this eating plan is called the DASH diet. DASH stands for Dietary Approaches to Stop Hypertension. To eat this way: Eat   plenty of fresh fruits and vegetables. Try to fill one half of your plate at each meal with fruits and vegetables. Eat whole grains, such as whole-wheat pasta, brown rice, or whole-grain bread. Fill about one  fourth of your plate with whole grains. Eat or drink low-fat dairy products, such as skim milk or low-fat yogurt. Avoid fatty cuts of meat, processed or cured meats, and poultry with skin. Fill about one fourth of your plate with lean proteins, such as fish, chicken without skin, beans, eggs, or tofu. Avoid pre-made and processed foods. These tend to be higher in sodium, added sugar, and fat. Reduce your daily sodium intake. Many people with hypertension should eat less than 1,500 mg of sodium a day. Do not drink alcohol if: Your health care provider tells you not to drink. You are pregnant, may be pregnant, or are planning to become pregnant. If you drink alcohol: Limit how much you have to: 0-1 drink a day for women. 0-2 drinks a day for men. Know how much alcohol is in your drink. In the [place] one drink equals one 12 oz bottle of beer (355 mL), one 5 oz glass of wine (148 mL), or one 1 oz glass of hard liquor (44 mL). Lifestyle  Work with your health care provider to maintain a healthy body weight or to lose weight. Ask what an ideal weight is for you. Get at least 30 minutes of exercise that causes your heart to beat faster (aerobic exercise) most days of the week. Activities may include walking, swimming, or biking. Include exercise to strengthen your muscles (resistance exercise), such as Pilates or lifting weights, as part of your weekly exercise routine. Try to do these types of exercises for 30 minutes at least 3 days a week. Do not use any products that contain nicotine or tobacco. These products include cigarettes, chewing tobacco, and vaping devices, such as e-cigarettes. If you need help quitting, ask your health care provider. Monitor your blood pressure at home as told by your health care provider. Keep all follow-up visits. This is important. Medicines Take over-the-counter and prescription medicines only as told by your health care provider. Follow directions carefully. Blood  pressure medicines must be taken as prescribed. Do not skip doses of blood pressure medicine. Doing this puts you at risk for problems and can make the medicine less effective. Ask your health care provider about side effects or reactions to medicines that you should watch for. Contact a health care provider if you: Think you are having a reaction to a medicine you are taking. Have headaches that keep coming back (recurring). Feel dizzy. Have swelling in your ankles. Have trouble with your vision. Get help right away if you: Develop a severe headache or confusion. Have unusual weakness or numbness. Feel faint. Have severe pain in your chest or abdomen. Vomit repeatedly. Have trouble breathing. These symptoms may be an emergency. Get help right away. Call 911. Do not wait to see if the symptoms will go away. Do not drive yourself to the hospital. Summary Hypertension is when the force of blood pumping through your arteries is too strong. If this condition is not controlled, it may put you at risk for serious complications. Your personal target blood pressure may vary depending on your medical conditions, your age, and other factors. For most people, a normal blood pressure is less than 120/80. Hypertension is treated with lifestyle changes, medicines, or a combination of both. Lifestyle changes include losing weight, eating a healthy,   low-sodium diet, exercising more, and limiting alcohol. This information is not intended to replace advice given to you by your health care provider. Make sure you discuss any questions you have with your health care provider. Document Revised: 06/26/2021 Document Reviewed: 06/26/2021 Elsevier Patient Education  2023 Elsevier Inc.  

## 2022-12-04 LAB — CBC WITH DIFFERENTIAL/PLATELET
Basophils Absolute: 0 10*3/uL (ref 0.0–0.2)
Basos: 0 %
EOS (ABSOLUTE): 0 10*3/uL (ref 0.0–0.4)
Eos: 1 %
Hematocrit: 34.8 % (ref 34.0–46.6)
Hemoglobin: 11.2 g/dL (ref 11.1–15.9)
Immature Grans (Abs): 0 10*3/uL (ref 0.0–0.1)
Immature Granulocytes: 0 %
Lymphocytes Absolute: 1.4 10*3/uL (ref 0.7–3.1)
Lymphs: 22 %
MCH: 29.2 pg (ref 26.6–33.0)
MCHC: 32.2 g/dL (ref 31.5–35.7)
MCV: 91 fL (ref 79–97)
Monocytes Absolute: 0.5 10*3/uL (ref 0.1–0.9)
Monocytes: 8 %
Neutrophils Absolute: 4.2 10*3/uL (ref 1.4–7.0)
Neutrophils: 69 %
Platelets: 206 10*3/uL (ref 150–450)
RBC: 3.84 x10E6/uL (ref 3.77–5.28)
RDW: 13.1 % (ref 11.7–15.4)
WBC: 6.1 10*3/uL (ref 3.4–10.8)

## 2022-12-04 LAB — LIPID PANEL
Chol/HDL Ratio: 3.9 ratio (ref 0.0–4.4)
Cholesterol, Total: 155 mg/dL (ref 100–199)
HDL: 40 mg/dL (ref >39)
LDL Chol Calc (NIH): 94 mg/dL (ref 0–99)
Triglycerides: 117 mg/dL (ref 0–149)
VLDL Cholesterol Cal: 21 mg/dL (ref 5–40)

## 2022-12-04 LAB — CMP14+EGFR
ALT: 15 IU/L (ref 0–32)
AST: 17 IU/L (ref 0–40)
Albumin/Globulin Ratio: 1.7 (ref 1.2–2.2)
Albumin: 4.3 g/dL (ref 3.8–4.8)
Alkaline Phosphatase: 86 IU/L (ref 44–121)
BUN/Creatinine Ratio: 20 (ref 12–28)
BUN: 53 mg/dL — ABNORMAL HIGH (ref 8–27)
Bilirubin Total: 0.3 mg/dL (ref 0.0–1.2)
CO2: 20 mmol/L (ref 20–29)
Calcium: 9.1 mg/dL (ref 8.7–10.3)
Chloride: 103 mmol/L (ref 96–106)
Creatinine, Ser: 2.6 mg/dL — ABNORMAL HIGH (ref 0.57–1.00)
Globulin, Total: 2.6 g/dL (ref 1.5–4.5)
Glucose: 92 mg/dL (ref 70–99)
Potassium: 5 mmol/L (ref 3.5–5.2)
Sodium: 138 mmol/L (ref 134–144)
Total Protein: 6.9 g/dL (ref 6.0–8.5)
eGFR: 18 mL/min/{1.73_m2} — ABNORMAL LOW (ref >59)

## 2022-12-04 LAB — TSH: TSH: 2.56 u[IU]/mL (ref 0.450–4.500)

## 2022-12-05 ENCOUNTER — Ambulatory Visit: Payer: Commercial Managed Care - PPO | Admitting: Family

## 2022-12-09 ENCOUNTER — Other Ambulatory Visit: Payer: Self-pay | Admitting: Family Medicine

## 2022-12-09 DIAGNOSIS — R7989 Other specified abnormal findings of blood chemistry: Secondary | ICD-10-CM

## 2022-12-17 ENCOUNTER — Ambulatory Visit (HOSPITAL_COMMUNITY)
Admission: RE | Admit: 2022-12-17 | Discharge: 2022-12-17 | Disposition: A | Discharge status: Home / Self Care | Payer: 59 | Source: Ambulatory Visit | Attending: Orthopaedic Surgery | Admitting: Orthopaedic Surgery

## 2022-12-17 DIAGNOSIS — M545 Low back pain, unspecified: Secondary | ICD-10-CM | POA: Diagnosis present

## 2022-12-17 DIAGNOSIS — M4316 Spondylolisthesis, lumbar region: Secondary | ICD-10-CM | POA: Diagnosis present

## 2022-12-17 DIAGNOSIS — M79604 Pain in right leg: Secondary | ICD-10-CM | POA: Insufficient documentation

## 2022-12-24 ENCOUNTER — Encounter: Payer: Self-pay | Admitting: Orthopaedic Surgery

## 2022-12-24 ENCOUNTER — Ambulatory Visit: Payer: 59 | Admitting: Orthopaedic Surgery

## 2022-12-24 DIAGNOSIS — M79604 Pain in right leg: Secondary | ICD-10-CM

## 2022-12-24 DIAGNOSIS — M545 Low back pain, unspecified: Secondary | ICD-10-CM

## 2022-12-24 DIAGNOSIS — M4316 Spondylolisthesis, lumbar region: Secondary | ICD-10-CM | POA: Diagnosis not present

## 2022-12-24 NOTE — Patient Instructions (Signed)
You have been referred to [facility] on [address] in [place]. They will call you to schedule the appointment. If you have not heard anything in one week call them to schedule [phone]. This referral has to be uploaded to another system because they are not on the same medical records system as we are. Please allow us two business days to get this uploaded for you.   

## 2022-12-24 NOTE — Progress Notes (Signed)
My back still hurts.  She had MRI of the lumbar spine showing: IMPRESSION: 1. 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 2. At L4-L5, anterolisthesis and facet arthropathy results in compression of the traversing right L5 nerve root in the right lateral recess. 3. Mild spinal canal stenosis at L2-3.   I have explained the findings to her.  I will have neurosurgery see her.  She has lower back pain, right sided sciatica with positive SLR at 30 on the right, uses cane to ambulate, no back spasm, limited ROM of back secondary to pain, muscle tone and strength normal, NV intact.  Encounter Diagnoses  Name Primary?   Lumbar pain with radiation down right leg Yes   Spondylolisthesis, lumbar region    To neurosurgery.  I have independently reviewed the MRI.    Call if any problem.  Precautions discussed.  Electronically Signed Darreld Mclean, MD 4/23/20248:31 AM

## 2022-12-25 ENCOUNTER — Other Ambulatory Visit: Payer: 59

## 2023-01-08 ENCOUNTER — Other Ambulatory Visit (HOSPITAL_BASED_OUTPATIENT_CLINIC_OR_DEPARTMENT_OTHER): Payer: Self-pay | Admitting: Cardiovascular Disease

## 2023-01-08 ENCOUNTER — Other Ambulatory Visit: Payer: Self-pay | Admitting: Family

## 2023-01-08 ENCOUNTER — Other Ambulatory Visit: Payer: Self-pay | Admitting: Cardiology

## 2023-01-08 DIAGNOSIS — N184 Chronic kidney disease, stage 4 (severe): Secondary | ICD-10-CM

## 2023-01-08 DIAGNOSIS — E785 Hyperlipidemia, unspecified: Secondary | ICD-10-CM

## 2023-01-08 DIAGNOSIS — I10 Essential (primary) hypertension: Secondary | ICD-10-CM

## 2023-01-09 ENCOUNTER — Other Ambulatory Visit: Payer: Self-pay | Admitting: Neurosurgery

## 2023-01-13 NOTE — Pre-Procedure Instructions (Signed)
Surgical Instructions    Your procedure is scheduled on Tuesday, May 21.  Report to [facility] Main Entrance "A" at 8:00 A.M., then check in with the Admitting office.  Call this number if you have problems the morning of surgery:  [phone]   If you have any questions prior to your surgery date call [phone]: Open Monday-Friday 8am-4pm If you experience any cold or flu symptoms such as cough, fever, chills, shortness of breath, etc. between now and your scheduled surgery, please notify us at the above number     Remember:  Do not eat or drink after midnight the night before your surgery     Take these medicines the morning of surgery with A SIP OF WATER:  atorvastatin (LIPITOR)  cloNIDine (CATAPRES)  doxazosin (CARDURA)  labetalol (NORMODYNE)   If needed: acetaminophen (TYLENOL)  traMADol (ULTRAM)   Follow your surgeon's instructions on when to stop Aspirin and ticagrelor (Brilinta).  If no instructions were given by your surgeon then you will need to call the office to get those instructions.     As of today, STOP taking any  Aleve, Naproxen, Ibuprofen, Motrin, Advil, Goody's, BC's, all herbal medications, fish oil, and all vitamins.            [facility] is not responsible for any belongings or valuables.    Do NOT Smoke (Tobacco/Vaping)  24 hours prior to your procedure  If you use a CPAP at night, you may bring your mask for your overnight stay.   Contacts, glasses, hearing aids, dentures or partials may not be worn into surgery, please bring cases for these belongings   For patients admitted to the hospital, discharge time will be determined by your treatment team.   Patients discharged the day of surgery will not be allowed to drive home, and someone needs to stay with them for 24 hours.   SURGICAL WAITING ROOM VISITATION Patients having surgery or a procedure may have no more than 2 support people in the waiting area - these visitors may rotate.    Children under the age of 71 must have an adult with them who is not the patient. If the patient needs to stay at the hospital during part of their recovery, the visitor guidelines for inpatient rooms apply. Pre-op nurse will coordinate an appropriate time for 1 support person to accompany patient in pre-op.  This support person may not rotate.   Please refer to [email or web address] for the visitor guidelines for Inpatients (after your surgery is over and you are in a regular room).    Special instructions:    Oral Hygiene is also important to reduce your risk of infection.  Remember - BRUSH YOUR TEETH THE MORNING OF SURGERY WITH YOUR REGULAR TOOTHPASTE     Pre-operative 5 CHG Bath Instructions   You can play a key role in reducing the risk of infection after surgery. Your skin needs to be as free of germs as possible. You can reduce the number of germs on your skin by washing with CHG (chlorhexidine gluconate) soap before surgery. CHG is an antiseptic soap that kills germs and continues to kill germs even after washing.   DO NOT use if you have an allergy to chlorhexidine/CHG or antibacterial soaps. If your skin becomes reddened or irritated, stop using the CHG and notify one of our RNs at [phone].   Please shower with the CHG soap starting 4 days before surgery using the following schedule:     Please  keep in mind the following:  DO NOT shave, including legs and underarms, starting the day of your first shower.   You may shave your face at any point before/day of surgery.  Place clean sheets on your bed the day you start using CHG soap. Use a clean washcloth (not used since being washed) for each shower. DO NOT sleep with pets once you start using the CHG.   CHG Shower Instructions:  If you choose to wash your hair and private area, wash first with your normal shampoo/soap.  After you use shampoo/soap, rinse your hair and body  thoroughly to remove shampoo/soap residue.  Turn the water OFF and apply about 3 tablespoons (45 ml) of CHG soap to a CLEAN washcloth.  Apply CHG soap ONLY FROM YOUR NECK DOWN TO YOUR TOES (washing for 3-5 minutes)  DO NOT use CHG soap on face, private areas, open wounds, or sores.  Pay special attention to the area where your surgery is being performed.  If you are having back surgery, having someone wash your back for you may be helpful. Wait 2 minutes after CHG soap is applied, then you may rinse off the CHG soap.  Pat dry with a clean towel  Put on clean clothes/pajamas   If you choose to wear lotion, please use ONLY the CHG-compatible lotions on the back of this paper.     Additional instructions for the day of surgery: DO NOT APPLY any lotions, deodorants, cologne, or perfumes.   Put on clean/comfortable clothes.  Brush your teeth.  Ask your nurse before applying any prescription medications to the skin.      CHG Compatible Lotions   Aveeno Moisturizing lotion  Cetaphil Moisturizing Cream  Cetaphil Moisturizing Lotion  Clairol Herbal Essence Moisturizing Lotion, Dry Skin  Clairol Herbal Essence Moisturizing Lotion, Extra Dry Skin  Clairol Herbal Essence Moisturizing Lotion, Normal Skin  Curel Age Defying Therapeutic Moisturizing Lotion with Alpha Hydroxy  Curel Extreme Care Body Lotion  Curel Soothing Hands Moisturizing Hand Lotion  Curel Therapeutic Moisturizing Cream, Fragrance-Free  Curel Therapeutic Moisturizing Lotion, Fragrance-Free  Curel Therapeutic Moisturizing Lotion, Original Formula  Eucerin Daily Replenishing Lotion  Eucerin Dry Skin Therapy Plus Alpha Hydroxy Crme  Eucerin Dry Skin Therapy Plus Alpha Hydroxy Lotion  Eucerin Original Crme  Eucerin Original Lotion  Eucerin Plus Crme Eucerin Plus Lotion  Eucerin TriLipid Replenishing Lotion  Keri Anti-Bacterial Hand Lotion  Keri Deep Conditioning Original Lotion Dry Skin Formula Softly Scented  Keri  Deep Conditioning Original Lotion, Fragrance Free Sensitive Skin Formula  Keri Lotion Fast Absorbing Fragrance Free Sensitive Skin Formula  Keri Lotion Fast Absorbing Softly Scented Dry Skin Formula  Keri Original Lotion  Keri Skin Renewal Lotion Keri Silky Smooth Lotion  Keri Silky Smooth Sensitive Skin Lotion  Nivea Body Creamy Conditioning Oil  Nivea Body Extra Enriched Lotion  Nivea Body Original Lotion  Nivea Body Sheer Moisturizing Lotion Nivea Crme  Nivea Skin Firming Lotion  NutraDerm 30 Skin Lotion  NutraDerm Skin Lotion  NutraDerm Therapeutic Skin Cream  NutraDerm Therapeutic Skin Lotion  ProShield Protective Hand Cream  Provon moisturizing lotion    Day of Surgery:  Take a shower with CHG soap. Wear Clean/Comfortable clothing the morning of surgery Do not wear jewelry or makeup. Do not wear lotions, powders, perfumes/cologne or deodorant. Do not shave 48 hours prior to surgery.  Men may shave face and neck. Do not bring valuables to the hospital. Do not wear nail polish, gel polish, artificial nails, or  any other type of covering on natural nails (fingers and toes) If you have artificial nails or gel coating that need to be removed by a nail salon, please have this removed prior to surgery. Artificial nails or gel coating may interfere with anesthesia's ability to adequately monitor your vital signs. Remember to brush your teeth WITH YOUR REGULAR TOOTHPASTE.    If you received a COVID test during your pre-op visit, it is requested that you wear a mask when out in public, stay away from anyone that may not be feeling well, and notify your surgeon if you develop symptoms. If you have been in contact with anyone that has tested positive in the last 10 days, please notify your surgeon.    Please read over the following fact sheets that you were given.

## 2023-01-14 ENCOUNTER — Encounter (HOSPITAL_COMMUNITY)
Admission: RE | Admit: 2023-01-14 | Discharge: 2023-01-14 | Disposition: A | Discharge status: Home / Self Care | Payer: 59 | Source: Ambulatory Visit | Attending: Neurosurgery | Admitting: Neurosurgery

## 2023-01-14 ENCOUNTER — Other Ambulatory Visit: Payer: Self-pay

## 2023-01-14 ENCOUNTER — Encounter (HOSPITAL_COMMUNITY): Payer: Self-pay

## 2023-01-14 VITALS — BP 164/75 | HR 96 | Temp 98.1°F | Resp 16 | Ht 64.0 in | Wt 133.9 lb

## 2023-01-14 DIAGNOSIS — Z01818 Encounter for other preprocedural examination: Secondary | ICD-10-CM

## 2023-01-14 DIAGNOSIS — I10 Essential (primary) hypertension: Secondary | ICD-10-CM | POA: Insufficient documentation

## 2023-01-14 DIAGNOSIS — Z01812 Encounter for preprocedural laboratory examination: Secondary | ICD-10-CM | POA: Insufficient documentation

## 2023-01-14 HISTORY — DX: Unspecified osteoarthritis, unspecified site: M19.90

## 2023-01-14 LAB — CBC
HCT: 39.9 % (ref 36.0–46.0)
Hemoglobin: 12.7 g/dL (ref 12.0–15.0)
MCH: 29 pg (ref 26.0–34.0)
MCHC: 31.8 g/dL (ref 30.0–36.0)
MCV: 91.1 fL (ref 80.0–100.0)
Platelets: 207 10*3/uL (ref 150–400)
RBC: 4.38 MIL/uL (ref 3.87–5.11)
RDW: 13.4 % (ref 11.5–15.5)
WBC: 6.7 10*3/uL (ref 4.0–10.5)
nRBC: 0 % (ref 0.0–0.2)

## 2023-01-14 LAB — BASIC METABOLIC PANEL
Anion gap: 10 (ref 5–15)
BUN: 33 mg/dL — ABNORMAL HIGH (ref 8–23)
CO2: 26 mmol/L (ref 22–32)
Calcium: 9.3 mg/dL (ref 8.9–10.3)
Chloride: 99 mmol/L (ref 98–111)
Creatinine, Ser: 1.98 mg/dL — ABNORMAL HIGH (ref 0.44–1.00)
GFR, Estimated: 25 mL/min — ABNORMAL LOW (ref >60)
Glucose, Bld: 124 mg/dL — ABNORMAL HIGH (ref 70–99)
Potassium: 4 mmol/L (ref 3.5–5.1)
Sodium: 135 mmol/L (ref 135–145)

## 2023-01-14 LAB — SURGICAL PCR SCREEN
MRSA, PCR: NEGATIVE
Staphylococcus aureus: NEGATIVE

## 2023-01-14 NOTE — Progress Notes (Signed)
PCP - Jannifer Rodney, FNP Cardiologist - denies  PPM/ICD - denies   Chest x-ray - 06/16/22 EKG - 06/16/22 Stress Test - denies ECHO - 06/18/22 Cardiac Cath - denies  Sleep Study - denies   DM- denies  Blood Thinner Instructions: n/a Aspirin Instructions: f/u with surgeon  ERAS Protcol - no, NPO   COVID TEST- n/a   Anesthesia review: no  Patient denies shortness of breath, fever, cough and chest pain at PAT appointment   All instructions explained to the patient, with a verbal understanding of the material. Patient agrees to go over the instructions while at home for a better understanding. The opportunity to ask questions was provided.

## 2023-01-21 ENCOUNTER — Observation Stay (HOSPITAL_COMMUNITY)
Admission: RE | Admit: 2023-01-21 | Discharge: 2023-01-22 | Disposition: A | Discharge status: Home / Self Care | Payer: 59 | Attending: Neurosurgery | Admitting: Neurosurgery

## 2023-01-21 ENCOUNTER — Ambulatory Visit (HOSPITAL_BASED_OUTPATIENT_CLINIC_OR_DEPARTMENT_OTHER): Payer: 59

## 2023-01-21 ENCOUNTER — Other Ambulatory Visit: Payer: Self-pay

## 2023-01-21 ENCOUNTER — Encounter (HOSPITAL_COMMUNITY): Payer: Self-pay | Admitting: Neurosurgery

## 2023-01-21 ENCOUNTER — Ambulatory Visit (HOSPITAL_COMMUNITY): Payer: 59 | Admitting: Physician Assistant

## 2023-01-21 ENCOUNTER — Ambulatory Visit (HOSPITAL_COMMUNITY): Payer: 59

## 2023-01-21 ENCOUNTER — Ambulatory Visit (HOSPITAL_COMMUNITY)
Admission: RE | Disposition: A | Discharge status: Home / Self Care | Payer: Self-pay | Source: Home / Self Care | Attending: Neurosurgery

## 2023-01-21 DIAGNOSIS — N184 Chronic kidney disease, stage 4 (severe): Secondary | ICD-10-CM | POA: Insufficient documentation

## 2023-01-21 DIAGNOSIS — I10 Essential (primary) hypertension: Secondary | ICD-10-CM | POA: Diagnosis not present

## 2023-01-21 DIAGNOSIS — Z79899 Other long term (current) drug therapy: Secondary | ICD-10-CM | POA: Diagnosis not present

## 2023-01-21 DIAGNOSIS — M4726 Other spondylosis with radiculopathy, lumbar region: Secondary | ICD-10-CM | POA: Diagnosis present

## 2023-01-21 DIAGNOSIS — Z739 Problem related to life management difficulty, unspecified: Secondary | ICD-10-CM

## 2023-01-21 DIAGNOSIS — Z7982 Long term (current) use of aspirin: Secondary | ICD-10-CM | POA: Diagnosis not present

## 2023-01-21 DIAGNOSIS — Z87891 Personal history of nicotine dependence: Secondary | ICD-10-CM | POA: Insufficient documentation

## 2023-01-21 DIAGNOSIS — M48061 Spinal stenosis, lumbar region without neurogenic claudication: Secondary | ICD-10-CM | POA: Diagnosis not present

## 2023-01-21 DIAGNOSIS — Z8673 Personal history of transient ischemic attack (TIA), and cerebral infarction without residual deficits: Secondary | ICD-10-CM | POA: Diagnosis not present

## 2023-01-21 DIAGNOSIS — R1031 Right lower quadrant pain: Secondary | ICD-10-CM

## 2023-01-21 DIAGNOSIS — M5416 Radiculopathy, lumbar region: Secondary | ICD-10-CM | POA: Diagnosis present

## 2023-01-21 DIAGNOSIS — I129 Hypertensive chronic kidney disease with stage 1 through stage 4 chronic kidney disease, or unspecified chronic kidney disease: Secondary | ICD-10-CM | POA: Insufficient documentation

## 2023-01-21 HISTORY — PX: LUMBAR LAMINECTOMY/DECOMPRESSION MICRODISCECTOMY: SHX5026

## 2023-01-21 SURGERY — LUMBAR LAMINECTOMY/DECOMPRESSION MICRODISCECTOMY 2 LEVELS
Anesthesia: General | Site: Back | Laterality: Right

## 2023-01-21 MED ORDER — VITAMIN B-12 1000 MCG PO TABS
1000.0000 ug | ORAL_TABLET | Freq: Every day | ORAL | Status: DC
  Administered 2023-01-21: 1000 ug via ORAL
  Filled 2023-01-21: qty 1

## 2023-01-21 MED ORDER — ATORVASTATIN CALCIUM 40 MG PO TABS: 40.0000 mg | ORAL_TABLET | Freq: Every day | ORAL | Status: DC

## 2023-01-21 MED ORDER — BUPIVACAINE HCL (PF) 0.25 % IJ SOLN
INTRAMUSCULAR | Status: DC | PRN
  Administered 2023-01-21: 20 mL

## 2023-01-21 MED ORDER — HYDROCODONE-ACETAMINOPHEN 10-325 MG PO TABS: 1.0000 | ORAL_TABLET | ORAL | Status: DC | PRN

## 2023-01-21 MED ORDER — LABETALOL HCL 200 MG PO TABS
200.0000 mg | ORAL_TABLET | Freq: Every day | ORAL | Status: DC
  Filled 2023-01-21: qty 1

## 2023-01-21 MED ORDER — DEXAMETHASONE SODIUM PHOSPHATE 10 MG/ML IJ SOLN
INTRAMUSCULAR | Status: AC
  Filled 2023-01-21: qty 1

## 2023-01-21 MED ORDER — CHLORTHALIDONE 25 MG PO TABS
25.0000 mg | ORAL_TABLET | Freq: Every day | ORAL | Status: DC
  Administered 2023-01-21: 25 mg via ORAL
  Filled 2023-01-21: qty 1

## 2023-01-21 MED ORDER — LACTATED RINGERS IV SOLN: INTRAVENOUS | Status: DC

## 2023-01-21 MED ORDER — ONDANSETRON HCL 4 MG/2ML IJ SOLN: 4.0000 mg | Freq: Four times a day (QID) | INTRAMUSCULAR | Status: DC | PRN

## 2023-01-21 MED ORDER — ONDANSETRON HCL 4 MG/2ML IJ SOLN
INTRAMUSCULAR | Status: AC
  Filled 2023-01-21: qty 2

## 2023-01-21 MED ORDER — FENTANYL CITRATE (PF) 100 MCG/2ML IJ SOLN
INTRAMUSCULAR | Status: AC
  Filled 2023-01-21: qty 2

## 2023-01-21 MED ORDER — OYSTER SHELL CALCIUM/D3 500-5 MG-MCG PO TABS
1.0000 | ORAL_TABLET | Freq: Every day | ORAL | Status: DC
  Administered 2023-01-21: 1 via ORAL
  Filled 2023-01-21: qty 1

## 2023-01-21 MED ORDER — MENTHOL 3 MG MT LOZG: 1.0000 | LOZENGE | OROMUCOSAL | Status: DC | PRN

## 2023-01-21 MED ORDER — SUGAMMADEX SODIUM 200 MG/2ML IV SOLN
INTRAVENOUS | Status: DC | PRN
  Administered 2023-01-21: 200 mg via INTRAVENOUS

## 2023-01-21 MED ORDER — ACETAMINOPHEN 500 MG PO TABS: 1000.0000 mg | ORAL_TABLET | Freq: Once | ORAL | Status: DC

## 2023-01-21 MED ORDER — TRAMADOL HCL 50 MG PO TABS: 50.0000 mg | ORAL_TABLET | Freq: Two times a day (BID) | ORAL | Status: DC | PRN

## 2023-01-21 MED ORDER — FENTANYL CITRATE (PF) 250 MCG/5ML IJ SOLN
INTRAMUSCULAR | Status: DC | PRN
  Administered 2023-01-21: 50 ug via INTRAVENOUS
  Administered 2023-01-21: 25 ug via INTRAVENOUS
  Administered 2023-01-21: 50 ug via INTRAVENOUS

## 2023-01-21 MED ORDER — CHLORHEXIDINE GLUCONATE CLOTH 2 % EX PADS: 6.0000 | MEDICATED_PAD | Freq: Once | CUTANEOUS | Status: DC

## 2023-01-21 MED ORDER — ROCURONIUM BROMIDE 10 MG/ML (PF) SYRINGE
PREFILLED_SYRINGE | INTRAVENOUS | Status: DC | PRN
  Administered 2023-01-21: 50 mg via INTRAVENOUS
  Administered 2023-01-21: 30 mg via INTRAVENOUS

## 2023-01-21 MED ORDER — LIDOCAINE 2% (20 MG/ML) 5 ML SYRINGE
INTRAMUSCULAR | Status: DC | PRN
  Administered 2023-01-21: 60 mg via INTRAVENOUS

## 2023-01-21 MED ORDER — SODIUM CHLORIDE 0.9 % IV SOLN
250.0000 mL | INTRAVENOUS | Status: DC
  Administered 2023-01-21: 250 mL via INTRAVENOUS

## 2023-01-21 MED ORDER — PHENOL 1.4 % MT LIQD: 1.0000 | OROMUCOSAL | Status: DC | PRN

## 2023-01-21 MED ORDER — LIDOCAINE 2% (20 MG/ML) 5 ML SYRINGE
INTRAMUSCULAR | Status: AC
  Filled 2023-01-21: qty 5

## 2023-01-21 MED ORDER — VANCOMYCIN HCL IN DEXTROSE 1-5 GM/200ML-% IV SOLN: 1000.0000 mg | INTRAVENOUS | Status: DC

## 2023-01-21 MED ORDER — KETOROLAC TROMETHAMINE 30 MG/ML IJ SOLN
INTRAMUSCULAR | Status: AC
  Filled 2023-01-21: qty 1

## 2023-01-21 MED ORDER — LOSARTAN POTASSIUM 25 MG PO TABS
12.5000 mg | ORAL_TABLET | Freq: Every day | ORAL | Status: DC
  Administered 2023-01-21: 12.5 mg via ORAL
  Filled 2023-01-21 (×2): qty 0.5

## 2023-01-21 MED ORDER — FENTANYL CITRATE (PF) 100 MCG/2ML IJ SOLN
25.0000 ug | INTRAMUSCULAR | Status: DC | PRN
  Administered 2023-01-21 (×2): 25 ug via INTRAVENOUS

## 2023-01-21 MED ORDER — THROMBIN (RECOMBINANT) 5000 UNITS EX SOLR
CUTANEOUS | Status: DC | PRN
  Administered 2023-01-21: 10 mL via TOPICAL

## 2023-01-21 MED ORDER — SODIUM CHLORIDE 0.9% FLUSH
3.0000 mL | Freq: Two times a day (BID) | INTRAVENOUS | Status: DC
  Administered 2023-01-21: 3 mL via INTRAVENOUS

## 2023-01-21 MED ORDER — CLONIDINE HCL 0.1 MG PO TABS
0.1000 mg | ORAL_TABLET | Freq: Three times a day (TID) | ORAL | Status: DC
  Administered 2023-01-21: 0.1 mg via ORAL
  Filled 2023-01-21 (×3): qty 1

## 2023-01-21 MED ORDER — 0.9 % SODIUM CHLORIDE (POUR BTL) OPTIME
TOPICAL | Status: DC | PRN
  Administered 2023-01-21: 1000 mL

## 2023-01-21 MED ORDER — ROCURONIUM BROMIDE 10 MG/ML (PF) SYRINGE
PREFILLED_SYRINGE | INTRAVENOUS | Status: AC
  Filled 2023-01-21: qty 10

## 2023-01-21 MED ORDER — CHLORHEXIDINE GLUCONATE 0.12 % MT SOLN: 15.0000 mL | Freq: Once | OROMUCOSAL | Status: DC

## 2023-01-21 MED ORDER — ACETAMINOPHEN 325 MG PO TABS: 650.0000 mg | ORAL_TABLET | ORAL | Status: DC | PRN

## 2023-01-21 MED ORDER — ASPIRIN 81 MG PO CHEW: 81.0000 mg | CHEWABLE_TABLET | Freq: Every day | ORAL | Status: DC

## 2023-01-21 MED ORDER — THROMBIN 5000 UNITS EX SOLR
CUTANEOUS | Status: AC
  Filled 2023-01-21: qty 10000

## 2023-01-21 MED ORDER — HYDRALAZINE HCL 20 MG/ML IJ SOLN
INTRAMUSCULAR | Status: AC
  Filled 2023-01-21: qty 1

## 2023-01-21 MED ORDER — HYDRALAZINE HCL 20 MG/ML IJ SOLN
INTRAMUSCULAR | Status: DC | PRN
  Administered 2023-01-21 (×3): 5 mg via INTRAVENOUS

## 2023-01-21 MED ORDER — VANCOMYCIN HCL 1000 MG IV SOLR
INTRAVENOUS | Status: AC
  Filled 2023-01-21: qty 20

## 2023-01-21 MED ORDER — DEXAMETHASONE SODIUM PHOSPHATE 10 MG/ML IJ SOLN
INTRAMUSCULAR | Status: DC | PRN
  Administered 2023-01-21: 5 mg via INTRAVENOUS

## 2023-01-21 MED ORDER — CEFAZOLIN SODIUM-DEXTROSE 1-4 GM/50ML-% IV SOLN
1.0000 g | Freq: Two times a day (BID) | INTRAVENOUS | Status: DC
  Administered 2023-01-22: 1 g via INTRAVENOUS
  Filled 2023-01-21: qty 50

## 2023-01-21 MED ORDER — FUROSEMIDE 20 MG PO TABS: 20.0000 mg | ORAL_TABLET | Freq: Every day | ORAL | Status: DC

## 2023-01-21 MED ORDER — NIFEDIPINE ER OSMOTIC RELEASE 60 MG PO TB24: 60.0000 mg | ORAL_TABLET | ORAL | Status: DC

## 2023-01-21 MED ORDER — KETOROLAC TROMETHAMINE 30 MG/ML IJ SOLN
INTRAMUSCULAR | Status: DC | PRN
  Administered 2023-01-21: 15 mg via INTRAVENOUS

## 2023-01-21 MED ORDER — MIDAZOLAM HCL 2 MG/2ML IJ SOLN: INTRAMUSCULAR | Status: DC | PRN

## 2023-01-21 MED ORDER — KETOROLAC TROMETHAMINE 15 MG/ML IJ SOLN
7.5000 mg | Freq: Four times a day (QID) | INTRAMUSCULAR | Status: DC
  Administered 2023-01-21 – 2023-01-22 (×2): 7.5 mg via INTRAVENOUS
  Filled 2023-01-21 (×2): qty 1

## 2023-01-21 MED ORDER — ORAL CARE MOUTH RINSE: 15.0000 mL | Freq: Once | OROMUCOSAL | Status: AC

## 2023-01-21 MED ORDER — PROPOFOL 10 MG/ML IV BOLUS
INTRAVENOUS | Status: DC | PRN
  Administered 2023-01-21: 100 mg via INTRAVENOUS

## 2023-01-21 MED ORDER — DOXAZOSIN MESYLATE 2 MG PO TABS
2.0000 mg | ORAL_TABLET | Freq: Every day | ORAL | Status: DC
  Filled 2023-01-21: qty 1

## 2023-01-21 MED ORDER — NIFEDIPINE ER OSMOTIC RELEASE 30 MG PO TB24: 30.0000 mg | ORAL_TABLET | ORAL | Status: DC

## 2023-01-21 MED ORDER — ACETAMINOPHEN 650 MG RE SUPP: 650.0000 mg | RECTAL | Status: DC | PRN

## 2023-01-21 MED ORDER — CHLORHEXIDINE GLUCONATE 0.12 % MT SOLN
15.0000 mL | Freq: Once | OROMUCOSAL | Status: AC
  Administered 2023-01-21: 15 mL via OROMUCOSAL
  Filled 2023-01-21: qty 15

## 2023-01-21 MED ORDER — CYCLOBENZAPRINE HCL 10 MG PO TABS: 10.0000 mg | ORAL_TABLET | Freq: Three times a day (TID) | ORAL | Status: DC | PRN

## 2023-01-21 MED ORDER — VITAMIN D 25 MCG (1000 UNIT) PO TABS
2000.0000 [IU] | ORAL_TABLET | Freq: Every day | ORAL | Status: DC
  Administered 2023-01-21: 2000 [IU] via ORAL
  Filled 2023-01-21: qty 2

## 2023-01-21 MED ORDER — LABETALOL HCL 200 MG PO TABS
200.0000 mg | ORAL_TABLET | Freq: Once | ORAL | Status: AC
  Administered 2023-01-21: 200 mg via ORAL
  Filled 2023-01-21: qty 1

## 2023-01-21 MED ORDER — ACETAMINOPHEN 500 MG PO TABS
1000.0000 mg | ORAL_TABLET | Freq: Four times a day (QID) | ORAL | Status: DC
  Administered 2023-01-21 – 2023-01-22 (×2): 1000 mg via ORAL
  Filled 2023-01-21 (×2): qty 2

## 2023-01-21 MED ORDER — NIFEDIPINE ER OSMOTIC RELEASE 60 MG PO TB24
90.0000 mg | ORAL_TABLET | Freq: Every day | ORAL | Status: DC
  Administered 2023-01-21: 90 mg via ORAL
  Filled 2023-01-21: qty 1

## 2023-01-21 MED ORDER — BUPIVACAINE HCL (PF) 0.25 % IJ SOLN
INTRAMUSCULAR | Status: AC
  Filled 2023-01-21: qty 30

## 2023-01-21 MED ORDER — SPIRONOLACTONE 25 MG PO TABS
25.0000 mg | ORAL_TABLET | Freq: Every day | ORAL | Status: DC
  Administered 2023-01-21: 25 mg via ORAL
  Filled 2023-01-21: qty 1

## 2023-01-21 MED ORDER — ONDANSETRON HCL 4 MG PO TABS: 4.0000 mg | ORAL_TABLET | Freq: Four times a day (QID) | ORAL | Status: DC | PRN

## 2023-01-21 MED ORDER — HYDROCODONE-ACETAMINOPHEN 5-325 MG PO TABS: 1.0000 | ORAL_TABLET | ORAL | Status: DC | PRN

## 2023-01-21 MED ORDER — TAB-A-VITE/IRON PO TABS
1.0000 | ORAL_TABLET | Freq: Every day | ORAL | Status: DC
  Administered 2023-01-21: 1 via ORAL
  Filled 2023-01-21 (×2): qty 1

## 2023-01-21 MED ORDER — ONDANSETRON HCL 4 MG/2ML IJ SOLN
INTRAMUSCULAR | Status: DC | PRN
  Administered 2023-01-21: 4 mg via INTRAVENOUS

## 2023-01-21 MED ORDER — CEFAZOLIN SODIUM-DEXTROSE 2-4 GM/100ML-% IV SOLN
2.0000 g | Freq: Once | INTRAVENOUS | Status: AC
  Administered 2023-01-21: 2 g via INTRAVENOUS

## 2023-01-21 MED ORDER — HYDROMORPHONE HCL 1 MG/ML IJ SOLN: 1.0000 mg | INTRAMUSCULAR | Status: DC | PRN

## 2023-01-21 MED ORDER — ORAL CARE MOUTH RINSE: 15.0000 mL | Freq: Once | OROMUCOSAL | Status: DC

## 2023-01-21 MED ORDER — SODIUM CHLORIDE 0.9% FLUSH: 3.0000 mL | INTRAVENOUS | Status: DC | PRN

## 2023-01-21 MED ORDER — FENTANYL CITRATE (PF) 250 MCG/5ML IJ SOLN
INTRAMUSCULAR | Status: AC
  Filled 2023-01-21: qty 5

## 2023-01-21 MED ORDER — CEFAZOLIN SODIUM-DEXTROSE 2-4 GM/100ML-% IV SOLN
INTRAVENOUS | Status: AC
  Filled 2023-01-21: qty 100

## 2023-01-21 SURGICAL SUPPLY — 47 items
ADH SKN CLS APL DERMABOND .7 (GAUZE/BANDAGES/DRESSINGS) ×1
APL SKNCLS STERI-STRIP NONHPOA (GAUZE/BANDAGES/DRESSINGS) ×1
BAG COUNTER SPONGE SURGICOUNT (BAG) ×1 IMPLANT
BAG DECANTER FOR FLEXI CONT (MISCELLANEOUS) ×1 IMPLANT
BAG SPNG CNTER NS LX DISP (BAG) ×1
BENZOIN TINCTURE PRP APPL 2/3 (GAUZE/BANDAGES/DRESSINGS) ×1 IMPLANT
BLADE CLIPPER SURG (BLADE) IMPLANT
BUR CUTTER 7.0 ROUND (BURR) ×1 IMPLANT
CANISTER SUCT 3000ML PPV (MISCELLANEOUS) ×1 IMPLANT
DERMABOND ADVANCED .7 DNX12 (GAUZE/BANDAGES/DRESSINGS) ×1 IMPLANT
DRAPE HALF SHEET 40X57 (DRAPES) IMPLANT
DRAPE LAPAROTOMY 100X72X124 (DRAPES) ×1 IMPLANT
DRAPE MICROSCOPE SLANT 54X150 (MISCELLANEOUS) ×1 IMPLANT
DRAPE SURG 17X23 STRL (DRAPES) ×2 IMPLANT
DRSG OPSITE POSTOP 4X6 (GAUZE/BANDAGES/DRESSINGS) IMPLANT
DURAPREP 26ML APPLICATOR (WOUND CARE) ×1 IMPLANT
ELECT REM PT RETURN 9FT ADLT (ELECTROSURGICAL) ×1
ELECTRODE REM PT RTRN 9FT ADLT (ELECTROSURGICAL) ×1 IMPLANT
GAUZE 4X4 16PLY ~~LOC~~+RFID DBL (SPONGE) IMPLANT
GAUZE SPONGE 4X4 12PLY STRL (GAUZE/BANDAGES/DRESSINGS) ×1 IMPLANT
GLOVE BIO SURGEON STRL SZ 6.5 (GLOVE) ×1 IMPLANT
GLOVE BIOGEL PI IND STRL 6.5 (GLOVE) ×1 IMPLANT
GLOVE ECLIPSE 9.0 STRL (GLOVE) ×1 IMPLANT
GLOVE EXAM NITRILE XL STR (GLOVE) IMPLANT
GOWN STRL REUS W/ TWL LRG LVL3 (GOWN DISPOSABLE) IMPLANT
GOWN STRL REUS W/ TWL XL LVL3 (GOWN DISPOSABLE) ×1 IMPLANT
GOWN STRL REUS W/TWL 2XL LVL3 (GOWN DISPOSABLE) IMPLANT
GOWN STRL REUS W/TWL LRG LVL3 (GOWN DISPOSABLE)
GOWN STRL REUS W/TWL XL LVL3 (GOWN DISPOSABLE) ×1
KIT BASIN OR (CUSTOM PROCEDURE TRAY) ×1 IMPLANT
KIT TURNOVER KIT B (KITS) ×1 IMPLANT
NDL SPNL 22GX3.5 QUINCKE BK (NEEDLE) IMPLANT
NEEDLE HYPO 22GX1.5 SAFETY (NEEDLE) ×1 IMPLANT
NEEDLE SPNL 22GX3.5 QUINCKE BK (NEEDLE) IMPLANT
NS IRRIG 1000ML POUR BTL (IV SOLUTION) ×1 IMPLANT
PACK LAMINECTOMY NEURO (CUSTOM PROCEDURE TRAY) ×1 IMPLANT
PAD ARMBOARD 7.5X6 YLW CONV (MISCELLANEOUS) ×3 IMPLANT
SOL ELECTROSURG ANTI STICK (MISCELLANEOUS) ×1
SOLUTION ELECTROSURG ANTI STCK (MISCELLANEOUS) ×1 IMPLANT
SPIKE FLUID TRANSFER (MISCELLANEOUS) ×1 IMPLANT
SPONGE SURGIFOAM ABS GEL SZ50 (HEMOSTASIS) ×1 IMPLANT
STRIP CLOSURE SKIN 1/2X4 (GAUZE/BANDAGES/DRESSINGS) ×1 IMPLANT
SUT VIC AB 2-0 CT1 18 (SUTURE) ×1 IMPLANT
SUT VIC AB 3-0 SH 8-18 (SUTURE) ×1 IMPLANT
TOWEL GREEN STERILE (TOWEL DISPOSABLE) ×1 IMPLANT
TOWEL GREEN STERILE FF (TOWEL DISPOSABLE) ×1 IMPLANT
WATER STERILE IRR 1000ML POUR (IV SOLUTION) ×1 IMPLANT

## 2023-01-21 NOTE — Transfer of Care (Signed)
Immediate Anesthesia Transfer of Care Note  Patient: Andrea Ross  Procedure(s) Performed: Laminectomy and Foraminotomy - right  Lumbar three-Lumbar four - Lumbar four-Lumbar five (Right: Back)  Patient Location: PACU  Anesthesia Type:General  Level of Consciousness: awake  Airway & Oxygen Therapy: Patient Spontanous Breathing  Post-op Assessment: Report given to RN, Post -op Vital signs reviewed and stable, and Patient moving all extremities X 4  Post vital signs: Reviewed and stable  Last Vitals:  Vitals Value Taken Time  BP 160/68 01/21/23 1456  Temp    Pulse 72 01/21/23 1500  Resp 28 01/21/23 1500  SpO2 98 % 01/21/23 1500  Vitals shown include unvalidated device data.  Last Pain:  Vitals:   01/21/23 0901  TempSrc:   PainSc: 10-Worst pain ever      Patients Stated Pain Goal: 3 (01/21/23 0901)  Complications: There were no known notable events for this encounter.

## 2023-01-21 NOTE — Op Note (Signed)
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  Surgeon:Stacey Maura A.Jenson Beedle, M.D.  Asst. Surgeon: Danielle Dess, MD  Anesthesia: General  Indication: 77 year old female with right lower extremity pain numbness and weakness consistent with a mixed lumbar radiculopathy.  Workup demonstrates evidence of critical stenosis right greater than left at L3-4 and to a lesser at L4-5.  Patient presents now for decompressive surgery.  Operative note: At induction of anesthesia, patient positioned prone on the Wilson frame and appropriately padded.  Lumbar region prepped and draped sterilely.  Incision made overlying L4.  Dissection performed in the right.  Retractor placed.  X-ray taken.  L4-5 level was confirmed.  Decompressive laminotomy was then performed on the right at L4-5.  This was done using high-speed drill and Kerrison rongeurs to remove the inferior two thirds the lamina of L4.  The medial aspect the L4-5 facet joint and the superior aspect of the L5 lamina were also removed.  Ligament flavum elevated and resected.  Foraminotomies were then performed on the course exiting L4 and L5 nerve roots.  This went very thorough decompression been achieved at this level.  There is no evidence of injury to the thecal sac and nerve roots.  Attention then placed to the L3-4 level.  Once again decompressive laminotomy and foraminotomies were performed.  This was again done by using high-speed drill to remove the inferior two thirds of the lamina of L3.  The medial aspect of the L3-4 facet joint and the superior aspect the L4 lamina.  Ligament flavum elevated and resected.  Foraminotomies then complete on the course exiting L3 and L4 nerve  roots on the right side.  A sublaminar decompression was then performed secondary to the severe stenosis at this level.  The spinous process was undercut.  The ligament flavum on the left side was elevated and resected providing good decompression of the central thecal sac.  At this point a very thorough decompression had been achieved.  There was no evidence of injury to the thecal sac or nerve roots.  The wound was irrigated.  Gelfoam was placed topically over the laminotomy defects.  Wound is then closed in layers with Vicryl sutures.  Steri-Strips and sterile dressing were applied.  No apparent complications.  Patient tolerated the procedure well and she returns to the recovery room postop.

## 2023-01-21 NOTE — Brief Op Note (Signed)
01/21/2023  2:37 PM  PATIENT:  Andrea Ross  77 y.o. female  PRE-OPERATIVE DIAGNOSIS:  Stenosis  POST-OPERATIVE DIAGNOSIS:  Stenosis  PROCEDURE:  Procedure(s): Laminectomy and Foraminotomy - right  Lumbar three-Lumbar four - Lumbar four-Lumbar five (Right)  SURGEON:  Surgeon(s) and Role:    * Julio Sicks, MD - Primary    Barnett Abu, MD - Assisting  PHYSICIAN ASSISTANT:   ASSISTANTS:    ANESTHESIA:   general  EBL:  100 mL   BLOOD ADMINISTERED:none  DRAINS: none   LOCAL MEDICATIONS USED:  MARCAINE     SPECIMEN:  No Specimen  DISPOSITION OF SPECIMEN:  N/A  COUNTS:  YES  TOURNIQUET:  * No tourniquets in log *  DICTATION: .Dragon Dictation  PLAN OF CARE: Admit for overnight observation  PATIENT DISPOSITION:  PACU - hemodynamically stable.   Delay start of Pharmacological VTE agent (>24hrs) due to surgical blood loss or risk of bleeding: yes

## 2023-01-21 NOTE — Anesthesia Preprocedure Evaluation (Addendum)
Anesthesia Evaluation  Patient identified by MRN, date of birth, ID band Patient awake    Reviewed: Allergy & Precautions, H&P , NPO status , Patient's Chart, lab work & pertinent test results, reviewed documented beta blocker date and time   Airway Mallampati: II  TM Distance: >3 FB Neck ROM: Full    Dental no notable dental hx. (+) Edentulous Upper, Edentulous Lower, Dental Advisory Given   Pulmonary former smoker   Pulmonary exam normal breath sounds clear to auscultation       Cardiovascular hypertension, Pt. on medications and Pt. on home beta blockers + Peripheral Vascular Disease   Rhythm:Regular Rate:Normal     Neuro/Psych CVA  negative psych ROS   GI/Hepatic negative GI ROS, Neg liver ROS,,,  Endo/Other  negative endocrine ROS    Renal/GU Renal InsufficiencyRenal disease  negative genitourinary   Musculoskeletal  (+) Arthritis , Osteoarthritis,    Abdominal   Peds  Hematology negative hematology ROS (+)   Anesthesia Other Findings   Reproductive/Obstetrics negative OB ROS                             Anesthesia Physical Anesthesia Plan  ASA: 3  Anesthesia Plan: General   Post-op Pain Management: Tylenol PO (pre-op)*   Induction: Intravenous  PONV Risk Score and Plan: 4 or greater and Ondansetron, Dexamethasone and Treatment may vary due to age or medical condition  Airway Management Planned: Oral ETT  Additional Equipment:   Intra-op Plan:   Post-operative Plan: Extubation in OR  Informed Consent: I have reviewed the patients History and Physical, chart, labs and discussed the procedure including the risks, benefits and alternatives for the proposed anesthesia with the patient or authorized representative who has indicated his/her understanding and acceptance.     Dental advisory given  Plan Discussed with: CRNA  Anesthesia Plan Comments:        Anesthesia  Quick Evaluation

## 2023-01-21 NOTE — Anesthesia Procedure Notes (Signed)
Procedure Name: Intubation Date/Time: 01/21/2023 1:06 PM  Performed by: Gaynelle Adu, MDPre-anesthesia Checklist: Patient identified, Emergency Drugs available, Suction available and Patient being monitored Patient Re-evaluated:Patient Re-evaluated prior to induction Oxygen Delivery Method: Circle system utilized Preoxygenation: Pre-oxygenation with 100% oxygen Induction Type: IV induction Ventilation: Oral airway inserted - appropriate to patient size Laryngoscope Size: Mac and 3 Grade View: Grade I Tube type: Oral Tube size: 7.0 mm Number of attempts: 1 Airway Equipment and Method: Stylet and Oral airway Placement Confirmation: ETT inserted through vocal cords under direct vision, positive ETCO2 and breath sounds checked- equal and bilateral Secured at: 21 cm Tube secured with: Tape Dental Injury: Teeth and Oropharynx as per pre-operative assessment

## 2023-01-21 NOTE — Plan of Care (Signed)

## 2023-01-21 NOTE — H&P (Signed)
Andrea Ross is an 77 y.o. female.   Chief Complaint: Right leg pain HPI: 77 year old female with severe right lower extremity pain numbness and some weakness which is failed conservative management.  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  The patient's pain is strictly radicular.  She has no back pain.  She has not responded to anti-inflammatory medications.  She presents now for lumbar decompressive surgery in hopes of improving her symptoms.  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  09/02/1990   PERIPHERAL VASCULAR INTERVENTION Left 06/10/2022   Procedure: PERIPHERAL VASCULAR INTERVENTION;  Surgeon: Maeola Harman, MD;  Location: [facility] INVASIVE CV LAB;  Service: Cardiovascular;  Laterality: Left;   RENAL ANGIOGRAPHY  06/10/2022   Procedure: RENAL ANGIOGRAPHY;  Surgeon: Maeola Harman, MD;  Location: [facility] INVASIVE CV LAB;  Service: Cardiovascular;;   SLT LASER APPLICATION Right 12/19/2014   Procedure: SLT LASER APPLICATION;  Surgeon: Susa Simmonds, MD;  Location: [facility];  Service: Ophthalmology;  Laterality: Right;   UMBILICAL HERNIA REPAIR  09/02/1978    Family History  Problem Relation Age of Onset   Hypertension Mother    Heart failure Mother    Diabetes Mother    Heart disease Mother    Hypertension Father    Cancer Father    Kidney disease Brother    Hypertension Brother    Stroke Brother    Social History:  reports that she quit smoking about 49 years ago. Her smoking use included cigarettes. She started  smoking about 52 years ago. She has a 0.08 pack-year smoking history. She has never been exposed to tobacco smoke. She has never used smokeless tobacco. She reports that she does not drink alcohol and does not use drugs.  Allergies:  Allergies  Allergen Reactions   Sodium Bicarbonate Itching   Pepperoni [Pickled Meat] Swelling   Penicillins Rash    Medications Prior to Admission  Medication Sig Dispense Refill   acetaminophen (TYLENOL) 500 MG tablet Take 500-1,000 mg by mouth every 6 (six) hours as needed for moderate pain or mild pain.     aspirin 81 MG chewable tablet Chew 81 mg by mouth daily.     atorvastatin (LIPITOR) 40 MG tablet TAKE 1 TABLET BY MOUTH EVERY DAY 90 tablet 1   Calcium Carbonate-Vit D-Min (CALCIUM 600+D3 PLUS MINERALS) 600-800 MG-UNIT TABS Take 1 tablet by mouth daily.     chlorthalidone (HYGROTON) 25 MG tablet Take 25 mg by mouth daily.     Cholecalciferol 50 MCG (2000 UT) TABS Take 2,000 Units by mouth daily.     cloNIDine (CATAPRES) 0.1 MG tablet TAKE 1 TABLET BY MOUTH TWICE A DAY (Patient taking differently: Take 0.1 mg by mouth 3 (three) times daily.) 180 tablet 1   cyanocobalamin (VITAMIN B12) 1000 MCG tablet Take 1,000 mcg by mouth daily.     doxazosin (CARDURA) 2 MG tablet TAKE 1 TABLET BY MOUTH EVERY DAY 90 tablet 3   furosemide (LASIX) 20 MG tablet Take 20 mg by mouth  daily.     labetalol (NORMODYNE) 200 MG tablet Take 200 mg by mouth daily.     losartan (COZAAR) 25 MG tablet Take 12.5 mg by mouth daily.     multivitamin-iron-minerals-folic acid (CENTRUM) chewable tablet Chew 1 tablet by mouth daily.     NIFEdipine (PROCARDIA XL/NIFEDICAL XL) 60 MG 24 hr tablet Take 60 mg by mouth See admin instructions. Take with 30 for a total of 90 mg at bedtime     NIFEdipine (PROCARDIA-XL/NIFEDICAL-XL) 30 MG 24 hr tablet Take 30 mg by mouth See admin instructions. Take with 60 mg for a total of 90 mg at bedtime     spironolactone (ALDACTONE) 25 MG tablet Take 25 mg by  mouth daily.     traMADol (ULTRAM) 50 MG tablet Take 1 tablet (50 mg total) by mouth every 6 (six) hours as needed. (Patient taking differently: Take 50 mg by mouth every 6 (six) hours as needed for severe pain or moderate pain.) 30 tablet 2   ticagrelor (BRILINTA) 90 MG TABS tablet TAKE 1 TABLET BY MOUTH 2 TIMES DAILY. (Patient not taking: Reported on 01/10/2023) 60 tablet 6    No results found for this or any previous visit (from the past 48 hour(s)). No results found.  Pertinent items noted in HPI and remainder of comprehensive ROS otherwise negative.  Blood pressure (!) 153/98, pulse 87, temperature 98.1 F (36.7 C), temperature source Oral, resp. rate 18, height 5\' 4"  (1.626 m), weight 61.7 kg, SpO2 99 %.  Patient is awake and alert.  She is oriented and appropriate.  Speech is fluent.  Judgment insight are intact.  Cranial nerve function normal bilateral.  Motor examination reveals weakness of her right quadriceps muscle groups grading out at 4-/5.  She has decreased strength with dorsiflexion on the right grading at 4/5.  The remainder of her motor examination is intact.  Sensory examination with decrease sensation pinprick light touch in her right L4 dermatome.  Deep tendon flexes normal active.  No evidence of long track signs.  Gait and posture are normal.  Examination head ears eyes nose throat unremarkable chest and abdomen are benign.  Extremities are free of major deformity. Assessment/Plan Right L3-4 and L4-5 spondylosis with stenosis and severe radiculopathy.  Plan right L3-4 and right L4-5 decompressive laminotomies and foraminotomies.  Risks and benefits been explained.  Patient wishes to proceed.  Kathaleen Maser Chett Taniguchi 01/21/2023, 12:11 PM

## 2023-01-21 NOTE — Anesthesia Postprocedure Evaluation (Signed)
Anesthesia Post Note  Patient: Andrea Ross  Procedure(s) Performed: Laminectomy and Foraminotomy - right  Lumbar three-Lumbar four - Lumbar four-Lumbar five (Right: Back)     Patient location during evaluation: PACU Anesthesia Type: General Level of consciousness: awake and alert Pain management: pain level controlled Vital Signs Assessment: post-procedure vital signs reviewed and stable Respiratory status: spontaneous breathing, nonlabored ventilation and respiratory function stable Cardiovascular status: blood pressure returned to baseline and stable Postop Assessment: no apparent nausea or vomiting Anesthetic complications: no  There were no known notable events for this encounter.  Last Vitals:  Vitals:   01/21/23 1545 01/21/23 1547  BP: (!) 184/72   Pulse: 67 65  Resp: 10 19  Temp:  (!) 36.4 C  SpO2: 98% 99%    Last Pain:  Vitals:   01/21/23 1515  TempSrc:   PainSc: Asleep                 Amberia Bayless,W. EDMOND

## 2023-01-21 NOTE — Progress Notes (Signed)
PHARMACY NOTE:  ANTIMICROBIAL RENAL DOSAGE ADJUSTMENT  Current antimicrobial regimen includes a mismatch between antimicrobial dosage and estimated renal function.  As per policy approved by the Pharmacy & Therapeutics and Medical Executive Committees, the antimicrobial dosage will be adjusted accordingly.  Current antimicrobial dosage:  cefazolin 1g IV q8 hours x2 doses  Indication: surgical prophylaxis  Renal Function:  Estimated Creatinine Clearance: 19.9 mL/min (A) (by C-G formula based on SCr of 1.98 mg/dL (H)). []      On intermittent HD, scheduled: []      On CRRT    Antimicrobial dosage has been changed to:  cefazolin 1g IV q12 hours x2 doses  Additional comments:   Thank you for allowing pharmacy to be a part of this patient's care.  Rexford Maus, PharmD, BCPS 01/21/2023 4:30 PM

## 2023-01-22 ENCOUNTER — Encounter (HOSPITAL_COMMUNITY): Payer: Self-pay | Admitting: Neurosurgery

## 2023-01-22 DIAGNOSIS — M4726 Other spondylosis with radiculopathy, lumbar region: Secondary | ICD-10-CM | POA: Diagnosis not present

## 2023-01-22 MED ORDER — TRAMADOL HCL 50 MG PO TABS
50.0000 mg | ORAL_TABLET | Freq: Four times a day (QID) | ORAL | 2 refills | Status: DC | PRN
Start: 2023-01-22 — End: 2023-02-27

## 2023-01-22 MED FILL — Thrombin For Soln 5000 Unit: CUTANEOUS | Qty: 2 | Status: AC

## 2023-01-22 NOTE — Discharge Summary (Signed)
Physician Discharge Summary  Patient ID: Andrea Ross MRN: [id number] DOB/AGE: 1946/01/24 77 y.o.  Admit date: 01/21/2023 Discharge date: 01/22/2023  Admission Diagnoses:  Discharge Diagnoses:  Principal Problem:   Lumbar radiculopathy   Discharged Condition: good  Hospital Course: Patient admitted to hospital where she underwent uncomplicated two-level lumbar decompressive surgery.  Postoperatively doing very well.  Preoperative back and lower extremity pain resolved.  Standing ambulating and voiding without difficulty.  Ready for discharge home.  Consults:   Significant Diagnostic Studies:   Treatments:   Discharge Exam: Blood pressure 115/72, pulse 77, temperature 98.4 F (36.9 C), temperature source Oral, resp. rate 16, height 5\' 4"  (1.626 m), weight 61.7 kg, SpO2 100 %. Awake and alert.  Oriented and appropriate.  Motor and sensory function intact.  Wound clean and dry.  Chest and abdomen benign.  Disposition: Discharge disposition: 01-Home or Self Care        Allergies as of 01/22/2023       Reactions   Sodium Bicarbonate Itching   Pepperoni [pickled Meat] Swelling   Penicillins Rash        Medication List     TAKE these medications    acetaminophen 500 MG tablet Commonly known as: TYLENOL Take 500-1,000 mg by mouth every 6 (six) hours as needed for moderate pain or mild pain.   aspirin 81 MG chewable tablet Chew 81 mg by mouth daily.   atorvastatin 40 MG tablet Commonly known as: LIPITOR TAKE 1 TABLET BY MOUTH EVERY DAY   Brilinta 90 MG Tabs tablet Generic drug: ticagrelor TAKE 1 TABLET BY MOUTH 2 TIMES DAILY.   Calcium 600+D3 Plus Minerals 600-800 MG-UNIT Tabs Take 1 tablet by mouth daily.   chlorthalidone 25 MG tablet Commonly known as: HYGROTON Take 25 mg by mouth daily.   Cholecalciferol 50 MCG (2000 UT) Tabs Take 2,000 Units by mouth daily.   cloNIDine 0.1 MG tablet Commonly known as: CATAPRES TAKE 1 TABLET BY MOUTH TWICE A  DAY What changed: when to take this   cyanocobalamin 1000 MCG tablet Commonly known as: VITAMIN B12 Take 1,000 mcg by mouth daily.   doxazosin 2 MG tablet Commonly known as: CARDURA TAKE 1 TABLET BY MOUTH EVERY DAY   furosemide 20 MG tablet Commonly known as: LASIX Take 20 mg by mouth daily.   labetalol 200 MG tablet Commonly known as: NORMODYNE Take 200 mg by mouth daily.   losartan 25 MG tablet Commonly known as: COZAAR Take 12.5 mg by mouth daily.   multivitamin-iron-minerals-folic acid chewable tablet Chew 1 tablet by mouth daily.   NIFEdipine 30 MG 24 hr tablet Commonly known as: PROCARDIA-XL/NIFEDICAL-XL Take 30 mg by mouth See admin instructions. Take with 60 mg for a total of 90 mg at bedtime   NIFEdipine 60 MG 24 hr tablet Commonly known as: PROCARDIA XL/NIFEDICAL XL Take 60 mg by mouth See admin instructions. Take with 30 for a total of 90 mg at bedtime   spironolactone 25 MG tablet Commonly known as: ALDACTONE Take 25 mg by mouth daily.   traMADol 50 MG tablet Commonly known as: ULTRAM Take 1 tablet (50 mg total) by mouth every 6 (six) hours as needed. What changed: reasons to take this         Signed: Temple Pacini 01/22/2023, 8:01 AM

## 2023-01-22 NOTE — Progress Notes (Signed)
Patient alert and oriented, mae's well, voiding adequate amount of urine, swallowing without difficulty, no c/o pain at time of discharge. Patient discharged home with family. Script and discharged instructions given to patient. Patient and sister stated understanding of instructions given. Patient has an appointment with Dr. Jordan Likes

## 2023-01-22 NOTE — Evaluation (Signed)
Occupational Therapy Evaluation Patient Details Name: Andrea Ross MRN: [id number] DOB: Jan 02, 1946 Today's Date: 01/22/2023   History of Present Illness 77 y.o. female s/p R L3-5 laminectomy and foraminotomy. PMH significant for arthritis, glaucoma, resistant hypertension, kidney failure, subclavian arterial stenosis.   Clinical Impression   PTA, pt lived with sister and was independent within the home. Upon eval, pt presents with decreased LE strength and balance. Pt performing UB ADL with set-up and LB ADL with supervision. Pt educated and demonstrating use of compensatory techniques for bed mobility, LB ADL, grooming, toileting, shower transfers, car transfers, and stair training within precautions. All education provided and questions answered. Recommending discharge home with sister to assist as needed. OT to sign off. Thank you for this order.      Recommendations for follow up therapy are one component of a multi-disciplinary discharge planning process, led by the attending physician.  Recommendations may be updated based on patient status, additional functional criteria and insurance authorization.   Assistance Recommended at Discharge Intermittent Supervision/Assistance  Patient can return home with the following A little help with bathing/dressing/bathroom;Assistance with cooking/housework;Assist for transportation;Help with stairs or ramp for entrance    Functional Status Assessment  Patient has had a recent decline in their functional status and demonstrates the ability to make significant improvements in function in a reasonable and predictable amount of time.  Equipment Recommendations  None recommended by OT    Recommendations for Other Services       Precautions / Restrictions Precautions Precautions: Back Precaution Booklet Issued: Yes (comment) Precaution Comments: All precautions reviewed within the context of ADL Restrictions Weight Bearing Restrictions: No       Mobility Bed Mobility Overal bed mobility: Modified Independent             General bed mobility comments: increased time    Transfers Overall transfer level: Needs assistance Equipment used: Straight cane Transfers: Sit to/from Stand Sit to Stand: Supervision           General transfer comment: incr time      Balance Overall balance assessment: Mild deficits observed, not formally tested                                         ADL either performed or assessed with clinical judgement   ADL Overall ADL's : Needs assistance/impaired Eating/Feeding: Independent   Grooming: Supervision/safety   Upper Body Bathing: Set up;Sitting   Lower Body Bathing: Sit to/from stand;Supervison/ safety   Upper Body Dressing : Set up;Sitting Upper Body Dressing Details (indicate cue type and reason): cues to avoid twisting while searching for garments Lower Body Dressing: Supervision/safety;Sit to/from stand   Toilet Transfer: Min guard;Ambulation (cane)     Toileting - Clothing Manipulation Details (indicate cue type and reason): reviewed comensatory strategies Tub/ Shower Transfer: Tub transfer;Min guard;Ambulation;Shower Field seismologist Details (indicate cue type and reason): min cues for use of compensatory techniques during new learning Functional mobility during ADLs: Min guard;Cane       Vision Baseline Vision/History: 1 Wears glasses Ability to See in Adequate Light: 0 Adequate Patient Visual Report: No change from baseline Additional Comments: glaucoma per chart; wearing glasses and using magnifying glass on arrival while reading bible     Perception Perception Perception Tested?: No   Praxis Praxis Praxis tested?: Within functional limits    Pertinent Vitals/Pain Pain Assessment Pain Assessment: Faces Faces  Pain Scale: Hurts a little bit Pain Location: operative site Pain Descriptors / Indicators: Sore, Guarding Pain  Intervention(s): Limited activity within patient's tolerance, Monitored during session     Hand Dominance Right   Extremity/Trunk Assessment Upper Extremity Assessment Upper Extremity Assessment: Overall WFL for tasks assessed   Lower Extremity Assessment Lower Extremity Assessment: Generalized weakness   Cervical / Trunk Assessment Cervical / Trunk Assessment: Back Surgery   Communication Communication Communication: No difficulties   Cognition Arousal/Alertness: Awake/alert Behavior During Therapy: WFL for tasks assessed/performed Overall Cognitive Status: Within Functional Limits for tasks assessed                                       General Comments  VSS    Exercises     Shoulder Instructions      Home Living Family/patient expects to be discharged to:: Private residence Living Arrangements: Other relatives Data processing manager, Andrea Ross) Available Help at Discharge: Family Type of Home: House Home Access: Stairs to enter [employer] of Steps: 5 in the back where there is a Buyer, retail: Left (wall on R side) Home Layout: One level     Bathroom Shower/Tub: Chief Strategy Officer: Handicapped height     Home Equipment: Shower seat;BSC/3in1;Cane - single point;Wheelchair - manual          Prior Functioning/Environment Prior Level of Function : Independent/Modified Independent             Mobility Comments: Cane in community ADLs Comments: independent, does not drive        OT Problem List: Decreased strength;Decreased activity tolerance;Impaired balance (sitting and/or standing)      OT Treatment/Interventions:      OT Goals(Current goals can be found in the care plan section) Acute Rehab OT Goals Patient Stated Goal: go home today OT Goal Formulation: With patient  OT Frequency:      Co-evaluation              AM-PAC OT "6 Clicks" Daily Activity     Outcome Measure Help from another person  eating meals?: None Help from another person taking care of personal grooming?: A Little Help from another person toileting, which includes using toliet, bedpan, or urinal?: A Little Help from another person bathing (including washing, rinsing, drying)?: A Little Help from another person to put on and taking off regular upper body clothing?: A Little Help from another person to put on and taking off regular lower body clothing?: A Little 6 Click Score: 19   End of Session Equipment Utilized During Treatment: Gait belt (cane) Nurse Communication: Mobility status  Activity Tolerance: Patient tolerated treatment well Patient left: in bed;with call bell/phone within reach;with family/visitor present  OT Visit Diagnosis: Unsteadiness on feet (R26.81);Muscle weakness (generalized) (M62.81)                Time: 1610-9604 OT Time Calculation (min): 25 min Charges:  OT General Charges $OT Visit: 1 Visit OT Evaluation $OT Eval Low Complexity: 1 Low OT Treatments $Self Care/Home Management : 8-22 mins  Tyler Deis, OTR/L [facility] Acute Rehabilitation Office: [phone]   Andrea Ross 01/22/2023, 9:12 AM

## 2023-01-22 NOTE — Discharge Instructions (Addendum)
Wound Care Keep incision covered and dry for three days.   Do not put any creams, lotions, or ointments on incision. Leave steri-strips on back.  They will fall off by themselves. Activity Walk each and every day, increasing distance each day. No lifting greater than 5 lbs.  Avoid excessive neck motion. No driving for 2 weeks; may ride as a passenger locally.  Resume your normal diet.   Call Your Doctor If Any of These Occur Redness, drainage, or swelling at the wound.  Temperature greater than 101 degrees. Severe pain not relieved by pain medication. Incision starts to come apart. Follow Up Appt Call  [phone]) for problems.  If you have any hardware placed in your spine, you will need an x-ray before your appointment.

## 2023-01-23 ENCOUNTER — Telehealth: Payer: Self-pay

## 2023-01-23 NOTE — Transitions of Care (Post Inpatient/ED Visit) (Signed)
01/23/2023  Name: Andrea Ross MRN: [id number] DOB: [birth year]  Today's TOC FU Call Status: Today's TOC FU Call Status:: Successful TOC FU Call Competed Unsuccessful Call (1st Attempt) Date: 01/23/23 [facility] FU Call Complete Date: 01/23/23  Transition Care Management Follow-up Telephone Call Date of Discharge: 01/22/23 Discharge Facility: [facility]) Type of Discharge: Inpatient Admission Primary Inpatient Discharge Diagnosis:: RLQ pain How have you been since you were released from the hospital?: Better  Items Reviewed: Did you receive and understand the discharge instructions provided?: Yes Medications obtained,verified, and reconciled?: Yes (Medications Reviewed) Any new allergies since your discharge?: No Dietary orders reviewed?: Yes Do you have support at home?: No  Medications Reviewed Today: Medications Reviewed Today     Reviewed by Karena Addison, LPN (Licensed Practical Nurse) on 01/23/23 at 1148  Med List Status: <None>   Medication Order Taking? Sig Documenting Provider Last Dose Status Informant  acetaminophen (TYLENOL) 500 MG tablet [id number] Yes Take 500-1,000 mg by mouth every 6 (six) hours as needed for moderate pain or mild pain. [provider] Taking Active Self  aspirin 81 MG chewable tablet [id number] Yes Chew 81 mg by mouth daily. [provider] Taking Active Self  atorvastatin (LIPITOR) 40 MG tablet [id number] Yes TAKE 1 TABLET BY MOUTH EVERY DAY Hawks, Christy A, FNP Taking Active Self  Calcium Carbonate-Vit D-Min (CALCIUM 600+D3 PLUS MINERALS) 600-800 MG-UNIT TABS [id number] Yes Take 1 tablet by mouth daily. [provider] Taking Active Self  chlorthalidone (HYGROTON) 25 MG tablet [id number] Yes Take 25 mg by mouth daily. [provider] Taking Active Self  Cholecalciferol 50 MCG (2000 UT) TABS [id number] Yes Take 2,000 Units by mouth daily. [provider] Taking Active Self  cloNIDine (CATAPRES) 0.1 MG  tablet [id number] Yes TAKE 1 TABLET BY MOUTH TWICE A DAY  Patient taking differently: Take 0.1 mg by mouth 3 (three) times daily.   Junie Spencer, FNP Taking Active Self  cyanocobalamin (VITAMIN B12) 1000 MCG tablet [id number] Yes Take 1,000 mcg by mouth daily. [provider] Taking Active Self  doxazosin (CARDURA) 2 MG tablet [id number] Yes TAKE 1 TABLET BY MOUTH EVERY DAY Branch, Dorothe Pea, MD Taking Active   furosemide (LASIX) 20 MG tablet [id number] Yes Take 20 mg by mouth daily. [provider] Taking Active Self  labetalol (NORMODYNE) 200 MG tablet [id number] Yes Take 200 mg by mouth daily. [provider] Taking Active Self  losartan (COZAAR) 25 MG tablet [id number] Yes Take 12.5 mg by mouth daily. [provider] Taking Active Self  multivitamin-iron-minerals-folic acid (CENTRUM) chewable tablet [id number] Yes Chew 1 tablet by mouth daily. [provider] Taking Active Self  NIFEdipine (PROCARDIA XL/NIFEDICAL XL) 60 MG 24 hr tablet [id number] Yes Take 60 mg by mouth See admin instructions. Take with 30 for a total of 90 mg at bedtime [provider] Taking Active Self  NIFEdipine (PROCARDIA-XL/NIFEDICAL-XL) 30 MG 24 hr tablet [id number] Yes Take 30 mg by mouth See admin instructions. Take with 60 mg for a total of 90 mg at bedtime [provider] Taking Active Self  spironolactone (ALDACTONE) 25 MG tablet [id number] Yes Take 25 mg by mouth daily. [provider] Taking Active Self  ticagrelor (BRILINTA) 90 MG TABS tablet [id number] Yes TAKE 1 TABLET BY MOUTH 2 TIMES DAILY. Antoine Poche, MD Taking Active Self  traMADol Janean Sark) 50 MG tablet [id number] Yes Take 1 tablet (50 mg total) by mouth every 6 (six) hours as needed. Julio Sicks, MD Taking  Active             Home Care and Equipment/Supplies: Were Home Health Services Ordered?: NA Any new equipment or medical supplies ordered?: NA  Functional Questionnaire: Do  you need assistance with bathing/showering or dressing?: No Do you need assistance with meal preparation?: No Do you need assistance with eating?: No Do you have difficulty maintaining continence: No Do you need assistance with getting out of bed/getting out of a chair/moving?: No Do you have difficulty managing or taking your medications?: No  Follow up appointments reviewed: PCP Follow-up appointment confirmed?: NA Specialist Hospital Follow-up appointment confirmed?: Yes Date of Specialist follow-up appointment?: 02/12/23 Follow-Up Specialty Provider:: general surgeon Do you need transportation to your follow-up appointment?: No Do you understand care options if your condition(s) worsen?: Yes-patient verbalized understanding    SIGNATURE Karena Addison, LPN [facility] Nurse Health Advisor Direct Dial [phone]

## 2023-01-23 NOTE — Transitions of Care (Post Inpatient/ED Visit) (Signed)
   01/23/2023  Name: Andrea Ross MRN: [id number] DOB: 24-Sep-1945  Today's TOC FU Call Status: Today's TOC FU Call Status:: Unsuccessul Call (1st Attempt) Unsuccessful Call (1st Attempt) Date: 01/23/23  Attempted to reach the patient regarding the most recent Inpatient/ED visit.  Follow Up Plan: Additional outreach attempts will be made to reach the patient to complete the Transitions of Care (Post Inpatient/ED visit) call.   Signature Karena Addison, LPN [facility] Nurse Health Advisor Direct Dial [phone]

## 2023-02-12 ENCOUNTER — Ambulatory Visit (HOSPITAL_COMMUNITY)
Admission: RE | Admit: 2023-02-12 | Discharge: 2023-02-12 | Disposition: A | Discharge status: Home / Self Care | Payer: 59 | Source: Ambulatory Visit | Attending: Vascular Surgery | Admitting: Vascular Surgery

## 2023-02-12 ENCOUNTER — Ambulatory Visit (INDEPENDENT_AMBULATORY_CARE_PROVIDER_SITE_OTHER): Payer: 59 | Admitting: Physician Assistant

## 2023-02-12 VITALS — BP 151/92 | HR 68 | Temp 97.6°F | Resp 14 | Ht 64.5 in | Wt 131.0 lb

## 2023-02-12 DIAGNOSIS — I701 Atherosclerosis of renal artery: Secondary | ICD-10-CM | POA: Diagnosis present

## 2023-02-12 NOTE — Progress Notes (Signed)
[facility] [place] HISTORY AND PHYSICAL   History of Present Illness:  Patient is a 77 y.o. year old female who presents for evaluation of  Renal artery stenosis.  S/P  post renal angiography with left renal artery stenting by Dr. Caroline More 06/10/2022.  This was performed due to sustained systolic blood pressures above 220.  She states her BP has been controlled well in the 130's systolic.  Blood pressure significantly improved since renal artery stenting. Dr. Randie Heinz also noticed 90% stenosis of the right renal artery origin. He will consider repeating angiogram and intervening on right renal artery if blood pressure did not show any improvement.   She states she has no pain on a daily basis.  She recently underwent lumbar procedure and since then she has been pain free.  She denies rest pain, claudication symptoms or non healing wounds.       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  09/02/1990   LUMBAR LAMINECTOMY/DECOMPRESSION MICRODISCECTOMY Right 01/21/2023   Procedure: Laminectomy and Foraminotomy - right  Lumbar three-Lumbar four - Lumbar four-Lumbar five;  Surgeon: Julio Sicks, MD;  Location: [facility] OR;  Service: Neurosurgery;  Laterality: Right;   PERIPHERAL VASCULAR INTERVENTION Left 06/10/2022   Procedure: PERIPHERAL VASCULAR INTERVENTION;  Surgeon: Maeola Harman, MD;  Location: [facility] INVASIVE CV LAB;  Service: Cardiovascular;  Laterality: Left;   RENAL ANGIOGRAPHY  06/10/2022   Procedure: RENAL ANGIOGRAPHY;  Surgeon: Maeola Harman, MD;  Location: [facility] INVASIVE CV LAB;  Service: Cardiovascular;;    SLT LASER APPLICATION Right 12/19/2014   Procedure: SLT LASER APPLICATION;  Surgeon: Susa Simmonds, MD;  Location: [facility] ORS;  Service: Ophthalmology;  Laterality: Right;   UMBILICAL HERNIA REPAIR  09/02/1978    ROS:   General:  No weight loss, Fever, chills  HEENT: No recent headaches, no nasal bleeding, no visual changes, no sore throat  Neurologic: No dizziness, blackouts, seizures. No recent symptoms of stroke or mini- stroke. No recent episodes of slurred speech, or temporary blindness.  Cardiac: No recent episodes of chest pain/pressure, no shortness of breath at rest.  No shortness of breath with exertion.  Denies history of atrial fibrillation or irregular heartbeat  Vascular: No history of rest pain in feet.  No history of claudication.  No history of non-healing ulcer, No history of DVT   Pulmonary: No home oxygen, no productive cough, no hemoptysis,  No asthma or wheezing  Musculoskeletal:  [ ]  Arthritis, [ ]  Low back pain,  [ ]  Joint pain  Hematologic:No history of hypercoagulable state.  No history of easy bleeding.  No history of anemia  Gastrointestinal: No hematochezia or melena,  No gastroesophageal reflux, no trouble swallowing  Urinary: [ ]  chronic Kidney disease, [ ]  on HD - [ ]  MWF or [ ]  TTHS, [ ]  Burning with urination, [ ]  Frequent urination, [ ]  Difficulty urinating;   Skin: No rashes  Psychological: No history of anxiety,  No history of depression  Social History Social History   Tobacco Use   Smoking status: Former  Packs/day: 0.25    Years: 0.30    Additional pack years: 0.00    Total pack years: 0.08    Types: Cigarettes    Start date: 07/03/1970    Quit date: 09/02/1973    Years since quitting: 49.4    Passive exposure: Never   Smokeless tobacco: Never  Vaping Use   Vaping Use: Never used  Substance Use Topics   Alcohol use: No    Alcohol/week: 0.0 standard drinks of alcohol   Drug use: No    Family History Family History  Problem  Relation Age of Onset   Hypertension Mother    Heart failure Mother    Diabetes Mother    Heart disease Mother    Hypertension Father    Cancer Father    Kidney disease Brother    Hypertension Brother    Stroke Brother     Allergies  Allergies  Allergen Reactions   Sodium Bicarbonate Itching   Pepperoni [Pickled Meat] Swelling   Penicillins Rash     Current Outpatient Medications  Medication Sig Dispense Refill   acetaminophen (TYLENOL) 500 MG tablet Take 500-1,000 mg by mouth every 6 (six) hours as needed for moderate pain or mild pain.     aspirin 81 MG chewable tablet Chew 81 mg by mouth daily.     atorvastatin (LIPITOR) 40 MG tablet TAKE 1 TABLET BY MOUTH EVERY DAY 90 tablet 1   Calcium Carbonate-Vit D-Min (CALCIUM 600+D3 PLUS MINERALS) 600-800 MG-UNIT TABS Take 1 tablet by mouth daily.     chlorthalidone (HYGROTON) 25 MG tablet Take 25 mg by mouth daily.     Cholecalciferol 50 MCG (2000 UT) TABS Take 2,000 Units by mouth daily.     cloNIDine (CATAPRES) 0.1 MG tablet TAKE 1 TABLET BY MOUTH TWICE A DAY (Patient taking differently: Take 0.1 mg by mouth 3 (three) times daily.) 180 tablet 1   cyanocobalamin (VITAMIN B12) 1000 MCG tablet Take 1,000 mcg by mouth daily.     doxazosin (CARDURA) 2 MG tablet TAKE 1 TABLET BY MOUTH EVERY DAY 90 tablet 3   furosemide (LASIX) 20 MG tablet Take 20 mg by mouth daily.     labetalol (NORMODYNE) 200 MG tablet Take 200 mg by mouth daily.     losartan (COZAAR) 25 MG tablet Take 12.5 mg by mouth daily.     multivitamin-iron-minerals-folic acid (CENTRUM) chewable tablet Chew 1 tablet by mouth daily.     NIFEdipine (PROCARDIA XL/NIFEDICAL XL) 60 MG 24 hr tablet Take 60 mg by mouth See admin instructions. Take with 30 for a total of 90 mg at bedtime     NIFEdipine (PROCARDIA-XL/NIFEDICAL-XL) 30 MG 24 hr tablet Take 30 mg by mouth See admin instructions. Take with 60 mg for a total of 90 mg at bedtime     spironolactone (ALDACTONE) 25 MG tablet  Take 25 mg by mouth daily.     ticagrelor (BRILINTA) 90 MG TABS tablet TAKE 1 TABLET BY MOUTH 2 TIMES DAILY. 60 tablet 6   traMADol (ULTRAM) 50 MG tablet Take 1 tablet (50 mg total) by mouth every 6 (six) hours as needed. 40 tablet 2   No current facility-administered medications for this visit.    Physical Examination  Vitals:   02/12/23 0959  BP: (!) 151/92  Pulse: 68  Resp: 14  Temp: 97.6 F (36.4 C)  TempSrc: Temporal  SpO2: 98%  Weight: 131 lb (59.4 kg)  Height: 5' 4.5" (1.638 m)    Body mass index  is 22.14 kg/m.  General:  Alert and oriented, no acute distress HEENT: Normal Neck: No bruit or JVD Pulmonary: Clear to auscultation bilaterally Cardiac: Regular Rate and Rhythm without murmur Abdomen: Soft, non-tender, non-distended, no mass, no scars Skin: No rash Extremity Pulses:   radial pulses bilaterally Musculoskeletal: No deformity or edema  Neurologic: Upper and lower extremity motor 5/5 and symmetric  DATA:  Duplex Findings:   +------------------+--------+--------+-------+  Right Renal ArteryPSV cm/sEDV cm/sComment  +------------------+--------+--------+-------+  Proximal           243      76            +------------------+--------+--------+-------+  Mid                117      40            +------------------+--------+--------+-------+  Distal              61      23            +------------------+--------+--------+-------+   +-----------------+--------+--------+-------+  Left Renal ArteryPSV cm/sEDV cm/sComment  +-----------------+--------+--------+-------+  Proximal          456     171            +-----------------+--------+--------+-------+  Mid               213      58            +-----------------+--------+--------+-------+  Distal            197      74            +-----------------+--------+--------+-------+   +------------+--------+--------+----+-----------+--------+--------+----+  Right  KidneyPSV cm/sEDV cm/sRI  Left KidneyPSV cm/sEDV cm/sRI    +------------+--------+--------+----+-----------+--------+--------+----+  Upper Pole  35      14      0.60Upper Pole 45      19      0.58  +------------+--------+--------+----+-----------+--------+--------+----+  Mid        41      14      0.                              +------------+--------+--------+----+-----------+--------+--------+----+  Lower Pole  31      12      0.61Lower Pole                       +------------+--------+--------+----+-----------+--------+--------+----+  Hilar                          Hilar                            +------------+--------+--------+----+-----------+--------+--------+----+   +------------------+-------+------------------+-------+  Right Kidney             Left Kidney                +------------------+-------+------------------+-------+  RAR                     RAR                        +------------------+-------+------------------+-------+  RAR (manual)             RAR (manual)               +------------------+-------+------------------+-------+  Cortex                  Cortex                     +------------------+-------+------------------+-------+  Cortex thickness  1.14 mmCorex thickness   0.85 mm  +------------------+-------+------------------+-------+  Kidney length (cm)8.04   Kidney length (cm)8.90     +------------------+-------+------------------+-------+     Summary:  Renal:    Right: Abnormal size for the right kidney. RRV flow present. 1-59%         stenosis of the right renal artery.  Left:  Abnormal size for the left kidney. Evidence of a > 60%         stenosis in the left renal artery. LRV flow present.         Suboptimal visualization of the left kidney. Further imaging         may be warranted.      ASSESSMENT/PLAN:  Uncontrolled hypertension s/p Left renal stent  Her BP is  better controlled.  She checks it frequently at home and systolic is 130's.  She had been complaining of right leg pain originating in the groin when weightbearing on the right leg.  She states since he spine surgery she no longer has pain.  Duplex is unchanged over all.  She has >60% stenosis at the proximal stent left renal artery.   Blood pressure significantly improved since renal artery stenting. Dr. Randie Heinz also noticed 90% stenosis of the right renal artery origin. The right renal artery today measures 1-59% stenosis.  He will consider repeating angiogram and intervening on right renal artery if blood pressure did not show any improvement. She will continue aspirin, Plavix, statin daily. We will plan to rescan renal arteries with duplex in 6 months.      Mosetta Pigeon PA-C [facility] [place] Office: [phone] Pager: [phone]

## 2023-02-13 ENCOUNTER — Encounter: Payer: Self-pay | Admitting: Physician Assistant

## 2023-02-24 ENCOUNTER — Other Ambulatory Visit: Payer: Self-pay

## 2023-02-24 DIAGNOSIS — I701 Atherosclerosis of renal artery: Secondary | ICD-10-CM

## 2023-02-27 ENCOUNTER — Ambulatory Visit: Payer: 59 | Attending: Cardiology | Admitting: Cardiology

## 2023-02-27 ENCOUNTER — Encounter: Payer: Self-pay | Admitting: Cardiology

## 2023-02-27 VITALS — BP 230/110 | HR 76 | Ht 64.5 in | Wt 134.8 lb

## 2023-02-27 DIAGNOSIS — I1A0 Resistant hypertension: Secondary | ICD-10-CM

## 2023-02-27 MED ORDER — NIFEDIPINE ER 90 MG PO TB24: 90.0000 mg | ORAL_TABLET | Freq: Every day | ORAL | 6 refills | Status: DC

## 2023-02-27 MED ORDER — LABETALOL HCL 200 MG PO TABS: 200.0000 mg | ORAL_TABLET | Freq: Every day | ORAL | 6 refills | Status: DC

## 2023-02-27 NOTE — Patient Instructions (Addendum)
Medication Instructions:   Labetalol refilled today  Change Nifedipine to 90mg  tablet - refill sent today  Continue all other medications.     Labwork:  none  Testing/Procedures:  none  Follow-Up:  4 months   Any Other Special Instructions Will Be Listed Below (If Applicable).  Nursing visit next week for vitals recheck - please bring all pill bottles to this visit   If you need a refill on your cardiac medications before your next appointment, please call your pharmacy.

## 2023-02-27 NOTE — Progress Notes (Signed)
Clinical Summary Andrea Ross is a 77 y.o.female seen today for follow up of the following medical problems.          1. HTN - long history of HTN, historically has been difficult to control - she also has been noted to have significantly high bp in left arm compared to right, up to 30 mmHg. She denies any arm pain or weakness. Carotid US showed right subclavian stenosis.  - normal renin/aldo ratio, normal TSH.   - we decreased her aldactone to 12.5mg  daily due to increase in Cr.  - recent stenting to left renal artery, unable to stent right.   - ran out of labetlol. Had been on 200mg  bid at our last visit, most recent list has 200mg  daily. Not sure when changed. She stopped taking her nifedipine 90mg , unclear history as well.      2.Subclavian stenosis - bp's need to be checked in left arm     3. CKD - followed by Dr Wolfgang Phoenix   4.Renal artery stenosis bilateral -followed by vascular, stent to left renal artery 06/2022 -unable to pass wire across right renal artery, could conider repeat in future     5. History of CVA    6.Back surgery 01/2023  7. 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     aspirin 81 MG chewable tablet Chew 81 mg by mouth daily.     atorvastatin (LIPITOR) 40 MG tablet TAKE 1 TABLET BY MOUTH EVERY DAY 90 tablet 1   Calcium Carbonate-Vit D-Min (CALCIUM 600+D3 PLUS MINERALS)  600-800 MG-UNIT TABS Take 1 tablet by mouth daily.     chlorthalidone (HYGROTON) 25 MG tablet Take 25 mg by mouth daily.     Cholecalciferol 50 MCG (2000 UT) TABS Take 2,000 Units by mouth daily.     cloNIDine (CATAPRES) 0.1 MG tablet Take 0.1 mg by mouth 2 (two) times daily.     cyanocobalamin (VITAMIN B12) 1000 MCG tablet Take 1,000 mcg by mouth daily.     doxazosin (CARDURA) 2 MG tablet TAKE 1 TABLET BY MOUTH EVERY DAY 90 tablet 3   furosemide (LASIX) 20 MG tablet Take 20 mg by mouth daily.     losartan (COZAAR) 25 MG tablet Take 12.5 mg by mouth daily.     multivitamin-iron-minerals-folic acid (CENTRUM) chewable tablet Chew 1 tablet by mouth daily.     NIFEdipine (PROCARDIA XL/NIFEDICAL XL) 60 MG 24 hr tablet Take 60 mg by mouth See admin instructions. Take with 30 for a total of 90 mg at bedtime     NIFEdipine (PROCARDIA-XL/NIFEDICAL-XL) 30 MG 24 hr tablet Take 30 mg by mouth See admin instructions. Take with 60 mg for a total of 90 mg at bedtime     spironolactone (ALDACTONE) 25 MG tablet Take 25 mg by mouth daily.     labetalol (  NORMODYNE) 200 MG tablet Take 200 mg by mouth daily. (Patient not taking: Reported on 02/27/2023)     No current facility-administered medications for this visit.     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 OR;  Service: Neurosurgery;  Laterality: Right;   PERIPHERAL VASCULAR INTERVENTION Left 06/10/2022   Procedure: PERIPHERAL VASCULAR INTERVENTION;  Surgeon: Maeola Harman, MD;  Location: [facility] INVASIVE CV LAB;  Service: Cardiovascular;  Laterality: Left;   RENAL ANGIOGRAPHY  06/10/2022   Procedure: RENAL ANGIOGRAPHY;  Surgeon: Maeola Harman, MD;  Location: [facility] INVASIVE CV LAB;  Service: Cardiovascular;;   SLT LASER APPLICATION Right 12/19/2014   Procedure: SLT LASER APPLICATION;  Surgeon: Susa Simmonds, MD;  Location: [facility] ORS;  Service: Ophthalmology;  Laterality: Right;   UMBILICAL HERNIA REPAIR  09/02/1978     Allergies  Allergen Reactions   Sodium Bicarbonate Itching   Pepperoni [Pickled Meat] Swelling   Penicillins Rash      Family History  Problem Relation Age of Onset   Hypertension Mother    Heart failure Mother    Diabetes Mother    Heart disease Mother    Hypertension Father    Cancer Father    Kidney disease Brother    Hypertension Brother    Stroke Brother      Social History Andrea Ross reports that she quit smoking about 49 years ago. Her smoking use included cigarettes. She started smoking about 52 years ago. She has a 0.08 pack-year smoking history. She has never been exposed to tobacco smoke. She has never used smokeless tobacco. Andrea Ross reports no history of alcohol use.   Review of Systems CONSTITUTIONAL: No weight loss, fever, chills, weakness or fatigue.  HEENT: Eyes: No visual loss, blurred vision, double vision or yellow sclerae.No hearing loss, sneezing, congestion, runny nose or sore throat.  SKIN: No rash or itching.  CARDIOVASCULAR: per hpi RESPIRATORY: No shortness of breath, cough or sputum.  GASTROINTESTINAL: No anorexia, nausea, vomiting or diarrhea. No abdominal pain or blood.  GENITOURINARY: No burning on urination, no polyuria NEUROLOGICAL: No headache, dizziness, syncope, paralysis, ataxia, numbness or tingling in the extremities. No change in bowel or bladder control.  MUSCULOSKELETAL: No muscle, back pain, joint pain or stiffness.  LYMPHATICS: No enlarged nodes. No history of splenectomy.  PSYCHIATRIC: No history of depression or anxiety.  ENDOCRINOLOGIC: No reports of sweating, cold or heat intolerance. No polyuria or polydipsia.   Marland Kitchen   Physical Examination Today's Vitals   02/27/23 0852 02/27/23 0935  BP: (!) 250/118 (!) 230/110  Pulse: 76   SpO2: 98%   Weight: 134 lb 12.8 oz (61.1 kg)   Height: 5' 4.5" (1.638 m)    Body mass index is 22.78 kg/m.  Gen: resting comfortably, no acute distress HEENT: no scleral icterus, pupils equal round and reactive, no palptable cervical adenopathy,  CV: RRR, 2/6 systolic murmur rusb, bilateral carotid bruits Resp: Clear to auscultation bilaterally GI: abdomen is soft, non-tender, non-distended, normal bowel sounds, no hepatosplenomegaly MSK: extremities are warm, no edema.  Skin: warm, no rash Neuro:  no focal deficits Psych: appropriate affect   Diagnostic Studies 06/2015 echo Study Conclusions  - Left ventricle: The cavity size was  normal. Wall thickness was   increased in a pattern of mild LVH. Systolic function was normal.   The estimated ejection fraction was in the range of 60% to 65%.   Wall motion was normal; there were no regional wall motion   abnormalities. Doppler parameters are consistent with abnormal   left ventricular relaxation (grade 1 diastolic dysfunction). - Aortic valve: Mildly calcified annulus. Trileaflet; mildly   calcified leaflets. There was trivial regurgitation. - Mitral valve: Calcified annulus. There was trivial regurgitation. - Right atrium: Central venous pressure (est): 3 mm Hg. - Atrial septum: No defect or patent foramen ovale was identified. - Tricuspid valve: There was trivial regurgitation. - Pulmonary arteries: Systolic pressure could not be accurately   estimated. - Pericardium, extracardiac: There was no pericardial effusion.  Impressions:  - Mild LVH with LVEF 60-65% and grade 1 diastolic dysfunction. MAC   with trivial mitral regurgitation. Sclerotic aortic valve without   stenosis, trivial aortic regurgitation noted.     06/2015 Carotid US 1-39% bilateral ICA diseae, right subclavian stenosis       Assessment and Plan   1.Resistant HTN - elevated bp but asymptomatic, restart her labetalol 200mg  daily and nifedipine 90mg  daily - nursing vitals check in 1 week     Antoine Poche, M.D.

## 2023-03-07 ENCOUNTER — Ambulatory Visit: Payer: 59 | Attending: Cardiovascular Disease | Admitting: *Deleted

## 2023-03-07 NOTE — Progress Notes (Signed)
Patient in office this morning for BP check.  She has taken her morning meds prior to coming for visit.  Patient did bring all medicine bottles with her today.  Medication list updated.   Reviewed proper technique on taking her BP.    BP today - 178/80  58  Patient brought in some previous readings - see below:  03/02/23 - 9:07 pm - 144/58  63 03/03/23 - 10:25 pm - 123/56  67 03/06/23 - 12:22 pm - 154/79  67              10:06 pm - 143/89  62 03/07/23 - 5:42 am -  178/85  70

## 2023-03-10 NOTE — Progress Notes (Signed)
Left message to return call 

## 2023-03-10 NOTE — Progress Notes (Signed)
Please increase labetalol from 200mg  daily to 200mg  bid. Update [place] on home bp's 1 week  Dominga Ferry MD

## 2023-03-13 ENCOUNTER — Telehealth: Payer: Self-pay | Admitting: Cardiology

## 2023-03-13 NOTE — Telephone Encounter (Signed)
Patient is returning phone call. Patient requested that we call her house phone instead of her mobile phone.

## 2023-03-13 NOTE — Telephone Encounter (Signed)
Favorite phone number switched

## 2023-03-13 NOTE — Progress Notes (Signed)
Left message with daughter Octavia Bruckner) - she will reach out to her mom to have her give [place] a call.

## 2023-03-18 ENCOUNTER — Other Ambulatory Visit: Payer: 59

## 2023-03-25 MED ORDER — LABETALOL HCL 200 MG PO TABS: 200.0000 mg | ORAL_TABLET | Freq: Two times a day (BID) | ORAL | Status: DC

## 2023-03-25 NOTE — Progress Notes (Signed)
Patient notified and verbalized understanding.  States she will be seeing Dr. Wolfgang Phoenix today also.  She will keep log as advised.

## 2023-04-18 ENCOUNTER — Encounter: Payer: Self-pay | Admitting: Orthopedic Surgery

## 2023-04-18 ENCOUNTER — Ambulatory Visit: Payer: 59 | Admitting: Orthopedic Surgery

## 2023-04-18 ENCOUNTER — Other Ambulatory Visit: Payer: Self-pay

## 2023-04-18 VITALS — BP 230/80 | HR 86 | Ht 64.5 in | Wt 134.0 lb

## 2023-04-18 DIAGNOSIS — M25552 Pain in left hip: Secondary | ICD-10-CM

## 2023-04-18 MED ORDER — PREDNISONE 10 MG (21) PO TBPK: ORAL_TABLET | ORAL | 0 refills | Status: DC

## 2023-04-18 NOTE — Progress Notes (Signed)
Orthopaedic Clinic Return  Assessment: Andrea Ross is a 77 y.o. female with the following: Acute onset left hip pain  Plan: Andrea Ross has had acute onset of pain in the left groin, radiating distally for the past 2 weeks.  No specific injury.  Of note, she did have surgery on her low back in May, 2024.  She states she is starting to have some low back pain, but this started after her left groin pain.  She has no numbness or tingling.  Repeat radiographs of the left hip obtained in clinic today demonstrates some mild degenerative changes, with slightly progressive loss of joint space compared to x-rays from November 2023.  This could be related to the hip joint, it could be abductor muscle pain or could be coming from her back.  We discussed multiple treatment options, and she is elected proceed with a prednisone Dosepak.  If becomes more clear that the pain is localized to the left hip joint, we could consider an injection in the future.  I would like to check on her in 2 weeks to see how she is doing.   Meds ordered this encounter  Medications   predniSONE (STERAPRED UNI-PAK 21 TAB) 10 MG (21) TBPK tablet    Sig: 10 mg DS 12 as directed    Dispense:  48 tablet    Refill:  0    Body mass index is 22.65 kg/m.  Follow-up: Return in about 2 weeks (around 05/02/2023).   Subjective:  Chief Complaint  Patient presents with   Hip Pain    States pain from left groin into leg / has had back surgery for similar pain on right/ pain with weightbearing can not walk     History of Present Illness: Andrea Ross is a 77 y.o. female who returns to clinic for evaluation of left hip pain.  She states that she has had pain in the left groin area for the past 1.5-2 weeks.  No specific injury.  She localizes the pain to the groin, radiating distally towards her knee, and past her knee.  The pain is relentless.  She has difficulty getting comfortable.  She has difficulty lifting her leg.  She had  surgery in May, 2024, and states that she had symptoms in the right leg at that time.  She denies numbness and tingling currently.  She has difficulty when she stands up, tries to walk.  She has been taking Tylenol, but this is not helping.  When she tries to move the leg, she has to use her hands to lift the leg.  Review of Systems: No fevers or chills No numbness or tingling No chest pain No shortness of breath No bowel or bladder dysfunction No GI distress No headaches   Objective: BP (!) 230/80   Pulse 86   Ht 5' 4.5" (1.638 m)   Wt 134 lb (60.8 kg)   BMI 22.65 kg/m   Physical Exam:  Alert and oriented.  She is in some obvious discomfort.  She is able to stand, but she has difficulty walking.  She demonstrates slow ability to transfer to the examination table.  She is no point tenderness.  Negative straight leg raise.  Sensation intact distally.  No tenderness to palpation of the greater trochanter laterally.  Pain is mildly irritated with internal rotation of the hip.  She has difficulty when you attempt to flex the hip.  IMAGING: I personally ordered and reviewed the following images:  [facility] pelvis  and left hip x-rays were obtained in clinic today.  These are compared to x-rays available in the system.  No acute injuries.  Mild loss of joint space.  Questionable progression of joint space narrowing compared to prior x-rays.  No acute injuries.  There are some lateral based osteophytes.  No collapse of the femoral head.  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

## 2023-05-02 ENCOUNTER — Ambulatory Visit: Payer: 59 | Admitting: Orthopedic Surgery

## 2023-05-02 ENCOUNTER — Encounter: Payer: Self-pay | Admitting: Orthopedic Surgery

## 2023-05-02 VITALS — BP 140/51 | HR 50 | Ht 64.5 in | Wt 129.5 lb

## 2023-05-02 DIAGNOSIS — M25552 Pain in left hip: Secondary | ICD-10-CM | POA: Diagnosis not present

## 2023-05-02 NOTE — Progress Notes (Signed)
Orthopaedic Clinic Return  Assessment: Andrea Ross is a 77 y.o. female with the following: Acute onset left hip pain  Plan: Andrea Ross he is doing much better.  She no longer has any pain in the left groin.  She denies ever having pain in the left buttock.  She is ambulating now without a cane.  She is very happy with her improvements.  Given the location of her ongoing pain, I told her we could potentially proceed with an injection, if her pain worsens.  She states her understanding.  She will continue with medicines as needed.  Nothing further needed at this time.   Follow-up: Return if symptoms worsen or fail to improve.   Subjective:  Chief Complaint  Patient presents with   Hip Pain    L/having a little pain, but not as bad as it has been.    History of Present Illness: Andrea Ross is a 77 y.o. female who returns to clinic for evaluation of left hip pain.  I saw her in clinic a couple of weeks ago.  She had acute onset pain in the left groin area which was very uncomfortable.  She has taken prednisone.  She notes that she is doing much better.  She is no longer using a cane to assist with ambulation.  She denies pain in the left buttock, radiating distally.  No numbness or tingling.  Review of Systems: No fevers or chills No numbness or tingling No chest pain No shortness of breath No bowel or bladder dysfunction No GI distress No headaches   Objective: BP (!) 140/51   Pulse (!) 50   Ht 5' 4.5" (1.638 m)   Wt 129 lb 8 oz (58.7 kg)   BMI 21.89 kg/m   Physical Exam:  Alert and oriented.  She appears very comfortable.  Nonantalgic gait, without assistive device.  Left hip no deformity.  No tenderness to palpation of the greater trochanter laterally.  She tolerates internal and external rotation of the left hip without discomfort.  She has good lower body strength bilaterally.  Sensation is intact throughout bilateral lower extremities.  Negative straight leg  raise.  IMAGING: I personally ordered and reviewed the following images:   No new imaging obtained today.   Oliver Barre, MD 05/02/2023 8:57 AM

## 2023-05-28 ENCOUNTER — Ambulatory Visit (INDEPENDENT_AMBULATORY_CARE_PROVIDER_SITE_OTHER): Payer: 59

## 2023-05-28 DIAGNOSIS — I771 Stricture of artery: Secondary | ICD-10-CM | POA: Diagnosis not present

## 2023-05-28 DIAGNOSIS — I779 Disorder of arteries and arterioles, unspecified: Secondary | ICD-10-CM

## 2023-06-01 ENCOUNTER — Other Ambulatory Visit: Payer: Self-pay | Admitting: Cardiology

## 2023-06-06 ENCOUNTER — Ambulatory Visit: Payer: 59 | Admitting: Family

## 2023-06-06 ENCOUNTER — Other Ambulatory Visit (HOSPITAL_BASED_OUTPATIENT_CLINIC_OR_DEPARTMENT_OTHER): Payer: Self-pay

## 2023-06-06 ENCOUNTER — Encounter: Payer: Self-pay | Admitting: Family

## 2023-06-06 VITALS — BP 118/63 | HR 88 | Temp 97.4°F | Ht 64.5 in | Wt 132.6 lb

## 2023-06-06 DIAGNOSIS — I6523 Occlusion and stenosis of bilateral carotid arteries: Secondary | ICD-10-CM

## 2023-06-06 DIAGNOSIS — M8580 Other specified disorders of bone density and structure, unspecified site: Secondary | ICD-10-CM

## 2023-06-06 DIAGNOSIS — Z8673 Personal history of transient ischemic attack (TIA), and cerebral infarction without residual deficits: Secondary | ICD-10-CM

## 2023-06-06 DIAGNOSIS — M5416 Radiculopathy, lumbar region: Secondary | ICD-10-CM

## 2023-06-06 DIAGNOSIS — I5030 Unspecified diastolic (congestive) heart failure: Secondary | ICD-10-CM

## 2023-06-06 DIAGNOSIS — N184 Chronic kidney disease, stage 4 (severe): Secondary | ICD-10-CM | POA: Diagnosis not present

## 2023-06-06 DIAGNOSIS — I10 Essential (primary) hypertension: Secondary | ICD-10-CM

## 2023-06-06 DIAGNOSIS — E785 Hyperlipidemia, unspecified: Secondary | ICD-10-CM | POA: Diagnosis not present

## 2023-06-06 LAB — CBC WITH DIFFERENTIAL/PLATELET
Basophils Absolute: 0 10*3/uL (ref 0.0–0.2)
Basos: 0 %
EOS (ABSOLUTE): 0.3 10*3/uL (ref 0.0–0.4)
Eos: 4 %
Hematocrit: 33.8 % — ABNORMAL LOW (ref 34.0–46.6)
Hemoglobin: 10.3 g/dL — ABNORMAL LOW (ref 11.1–15.9)
Immature Grans (Abs): 0 10*3/uL (ref 0.0–0.1)
Immature Granulocytes: 0 %
Lymphocytes Absolute: 1.2 10*3/uL (ref 0.7–3.1)
Lymphs: 19 %
MCH: 27.8 pg (ref 26.6–33.0)
MCHC: 30.5 g/dL — ABNORMAL LOW (ref 31.5–35.7)
MCV: 91 fL (ref 79–97)
Monocytes Absolute: 0.8 10*3/uL (ref 0.1–0.9)
Monocytes: 13 %
Neutrophils Absolute: 4 10*3/uL (ref 1.4–7.0)
Neutrophils: 64 %
Platelets: 225 10*3/uL (ref 150–450)
RBC: 3.7 x10E6/uL — ABNORMAL LOW (ref 3.77–5.28)
RDW: 13.8 % (ref 11.7–15.4)
WBC: 6.3 10*3/uL (ref 3.4–10.8)

## 2023-06-06 LAB — CMP14+EGFR
ALT: 14 [IU]/L (ref 0–32)
AST: 17 [IU]/L (ref 0–40)
Albumin: 3.8 g/dL (ref 3.8–4.8)
Alkaline Phosphatase: 103 [IU]/L (ref 44–121)
BUN/Creatinine Ratio: 18 (ref 12–28)
BUN: 42 mg/dL — ABNORMAL HIGH (ref 8–27)
Bilirubin Total: 0.2 mg/dL (ref 0.0–1.2)
CO2: 22 mmol/L (ref 20–29)
Calcium: 9.2 mg/dL (ref 8.7–10.3)
Chloride: 102 mmol/L (ref 96–106)
Creatinine, Ser: 2.35 mg/dL — ABNORMAL HIGH (ref 0.57–1.00)
Globulin, Total: 2.2 g/dL (ref 1.5–4.5)
Glucose: 121 mg/dL — ABNORMAL HIGH (ref 70–99)
Potassium: 4.8 mmol/L (ref 3.5–5.2)
Sodium: 140 mmol/L (ref 134–144)
Total Protein: 6 g/dL (ref 6.0–8.5)
eGFR: 21 mL/min/{1.73_m2} — ABNORMAL LOW (ref >59)

## 2023-06-06 MED ORDER — ATORVASTATIN CALCIUM 40 MG PO TABS: 40.0000 mg | ORAL_TABLET | Freq: Every day | ORAL | 1 refills | Status: DC

## 2023-06-06 NOTE — Progress Notes (Signed)
Subjective:    Patient ID: Andrea Ross, female    DOB: [birth year], 77 y.o.   MRN: [id number]  Chief Complaint  Patient presents with   Medical Management of Chronic Issues   Pt presents to the office today for chronic follow up. She saw HTN clinic, but has not had to go back. She is followed by Cardiologists for CHF.    She has hx of CVA.    She has CKD and is followed by nephrologists. Avoids NSAID's, this is stable.    She has osteopenia and her last dexa scan was 11/29/21. She takes calcium and vit D daily.   Hypertension This is a chronic problem. The current episode started more than 1 year ago. The problem has been waxing and waning since onset. Associated symptoms include malaise/fatigue. Pertinent negatives include no peripheral edema or shortness of breath. Risk factors for coronary artery disease include sedentary lifestyle. The current treatment provides moderate improvement.  Congestive Heart Failure Presents for follow-up visit. Associated symptoms include fatigue. Pertinent negatives include no edema or shortness of breath. The symptoms have been stable.  Back Pain This is a chronic problem. The current episode started more than 1 year ago. The problem occurs intermittently. The problem has been waxing and waning since onset. The pain is present in the lumbar spine. The quality of the pain is described as aching. The pain is at a severity of 0/10. The patient is experiencing no pain.  Hyperlipidemia This is a chronic problem. The current episode started more than 1 year ago. Pertinent negatives include no shortness of breath. Current antihyperlipidemic treatment includes statins. The current treatment provides moderate improvement of lipids. Risk factors for coronary artery disease include dyslipidemia, hypertension and a sedentary lifestyle.      Review of Systems  Constitutional:  Positive for fatigue and malaise/fatigue.  Respiratory:  Negative for shortness of  breath.   Musculoskeletal:  Positive for back pain.  All other systems reviewed and are negative.      Objective:   Physical Exam Vitals reviewed.  Constitutional:      General: She is not in acute distress.    Appearance: She is well-developed.  HENT:     Head: Normocephalic and atraumatic.     Right Ear: Tympanic membrane normal.     Left Ear: Tympanic membrane normal.  Eyes:     Pupils: Pupils are equal, round, and reactive to light.  Neck:     Thyroid: No thyromegaly.  Cardiovascular:     Rate and Rhythm: Normal rate and regular rhythm.     Heart sounds: Normal heart sounds. No murmur heard. Pulmonary:     Effort: Pulmonary effort is normal. No respiratory distress.     Breath sounds: Normal breath sounds. No wheezing.  Abdominal:     General: Bowel sounds are normal. There is no distension.     Palpations: Abdomen is soft.     Tenderness: There is no abdominal tenderness.  Musculoskeletal:        General: No tenderness. Normal range of motion.     Cervical back: Normal range of motion and neck supple.  Skin:    General: Skin is warm and dry.  Neurological:     Mental Status: She is alert and oriented to person, place, and time.     Cranial Nerves: No cranial nerve deficit.     Deep Tendon Reflexes: Reflexes are normal and symmetric.  Psychiatric:        Behavior:  Behavior normal.        Thought Content: Thought content normal.        Judgment: Judgment normal.    BP 118/63   Pulse 88   Temp (!) 97.4 F (36.3 C) (Temporal)   Ht 5' 4.5" (1.638 m)   Wt 132 lb 9.6 oz (60.1 kg)   SpO2 98%   BMI 22.41 kg/m       Assessment & Plan:  Andrea Ross comes in today with chief complaint of Medical Management of Chronic Issues   Diagnosis and orders addressed:  1. Hyperlipidemia, unspecified hyperlipidemia type - atorvastatin (LIPITOR) 40 MG tablet; Take 1 tablet (40 mg total) by mouth daily.  Dispense: 90 tablet; Refill: 1 - CMP14+EGFR - CBC with  Differential/Platelet  2. Benign essential hypertension - CMP14+EGFR - CBC with Differential/Platelet  3. Chronic kidney disease (CKD), stage IV (severe) (HCC) - CMP14+EGFR - CBC with Differential/Platelet  4. Diastolic congestive heart failure, NYHA class 1, unspecified congestive heart failure chronicity (HCC) - CMP14+EGFR - CBC with Differential/Platelet  5. History of cardioembolic cerebrovascular accident (CVA) - CMP14+EGFR - CBC with Differential/Platelet  6. Lumbar radiculopathy - CMP14+EGFR - CBC with Differential/Platelet  7. Osteopenia, unspecified location - CMP14+EGFR - CBC with Differential/Platelet   Labs pending Health Maintenance reviewed Diet and exercise encouraged  Follow up plan: 6 months    Jannifer Rodney, FNP

## 2023-06-06 NOTE — Patient Instructions (Signed)
Health Maintenance After Age 79 After age 79, you are at a higher risk for certain long-term diseases and infections as well as injuries from falls. Falls are a major cause of broken bones and head injuries in people who are older than age 79. Getting regular preventive care can help to keep you healthy and well. Preventive care includes getting regular testing and making lifestyle changes as recommended by your health care provider. Talk with your health care provider about: Which screenings and tests you should have. A screening is a test that checks for a disease when you have no symptoms. A diet and exercise plan that is right for you. What should I know about screenings and tests to prevent falls? Screening and testing are the best ways to find a health problem early. Early diagnosis and treatment give you the best chance of managing medical conditions that are common after age 79. Certain conditions and lifestyle choices may make you more likely to have a fall. Your health care provider may recommend: Regular vision checks. Poor vision and conditions such as cataracts can make you more likely to have a fall. If you wear glasses, make sure to get your prescription updated if your vision changes. Medicine review. Work with your health care provider to regularly review all of the medicines you are taking, including over-the-counter medicines. Ask your health care provider about any side effects that may make you more likely to have a fall. Tell your health care provider if any medicines that you take make you feel dizzy or sleepy. Strength and balance checks. Your health care provider may recommend certain tests to check your strength and balance while standing, walking, or changing positions. Foot health exam. Foot pain and numbness, as well as not wearing proper footwear, can make you more likely to have a fall. Screenings, including: Osteoporosis screening. Osteoporosis is a condition that causes  the bones to get weaker and break more easily. Blood pressure screening. Blood pressure changes and medicines to control blood pressure can make you feel dizzy. Depression screening. You may be more likely to have a fall if you have a fear of falling, feel depressed, or feel unable to do activities that you used to do. Alcohol use screening. Using too much alcohol can affect your balance and may make you more likely to have a fall. Follow these instructions at home: Lifestyle Do not drink alcohol if: Your health care provider tells you not to drink. If you drink alcohol: Limit how much you have to: 0-1 drink a day for women. 0-2 drinks a day for men. Know how much alcohol is in your drink. In the [place] one drink equals one 12 oz bottle of beer (355 mL), one 5 oz glass of wine (148 mL), or one 1 oz glass of hard liquor (44 mL). Do not use any products that contain nicotine or tobacco. These products include cigarettes, chewing tobacco, and vaping devices, such as e-cigarettes. If you need help quitting, ask your health care provider. Activity  Follow a regular exercise program to stay fit. This will help you maintain your balance. Ask your health care provider what types of exercise are appropriate for you. If you need a cane or walker, use it as recommended by your health care provider. Wear supportive shoes that have nonskid soles. Safety  Remove any tripping hazards, such as rugs, cords, and clutter. Install safety equipment such as grab bars in bathrooms and safety rails on stairs. Keep rooms and walkways   well-lit. General instructions Talk with your health care provider about your risks for falling. Tell your health care provider if: You fall. Be sure to tell your health care provider about all falls, even ones that seem minor. You feel dizzy, tiredness (fatigue), or off-balance. Take over-the-counter and prescription medicines only as told by your health care provider. These include  supplements. Eat a healthy diet and maintain a healthy weight. A healthy diet includes low-fat dairy products, low-fat (lean) meats, and fiber from whole grains, beans, and lots of fruits and vegetables. Stay current with your vaccines. Schedule regular health, dental, and eye exams. Summary Having a healthy lifestyle and getting preventive care can help to protect your health and wellness after age 79. Screening and testing are the best way to find a health problem early and help you avoid having a fall. Early diagnosis and treatment give you the best chance for managing medical conditions that are more common for people who are older than age 79. Falls are a major cause of broken bones and head injuries in people who are older than age 79. Take precautions to prevent a fall at home. Work with your health care provider to learn what changes you can make to improve your health and wellness and to prevent falls. This information is not intended to replace advice given to you by your health care provider. Make sure you discuss any questions you have with your health care provider. Document Revised: 01/08/2021 Document Reviewed: 01/08/2021 Elsevier Patient Education  2024 Elsevier Inc.  

## 2023-06-09 ENCOUNTER — Other Ambulatory Visit: Payer: 59

## 2023-07-02 ENCOUNTER — Encounter: Payer: Self-pay | Admitting: Cardiology

## 2023-07-02 ENCOUNTER — Ambulatory Visit: Payer: 59 | Attending: Cardiology | Admitting: Cardiology

## 2023-07-02 VITALS — BP 165/85 | HR 60 | Ht 64.0 in | Wt 132.4 lb

## 2023-07-02 DIAGNOSIS — E785 Hyperlipidemia, unspecified: Secondary | ICD-10-CM | POA: Diagnosis not present

## 2023-07-02 DIAGNOSIS — Z79899 Other long term (current) drug therapy: Secondary | ICD-10-CM | POA: Diagnosis not present

## 2023-07-02 DIAGNOSIS — I1A0 Resistant hypertension: Secondary | ICD-10-CM

## 2023-07-02 MED ORDER — FUROSEMIDE 20 MG PO TABS: 40.0000 mg | ORAL_TABLET | Freq: Every day | ORAL | 3 refills | Status: DC

## 2023-07-02 MED ORDER — ATORVASTATIN CALCIUM 80 MG PO TABS: 80.0000 mg | ORAL_TABLET | Freq: Every day | ORAL | 3 refills | Status: DC

## 2023-07-02 NOTE — Progress Notes (Signed)
Clinical Summary Ms. Mcfalls is a 77 y.o.female seen today for follow up of the following medical problems.       1. HTN - long history of HTN, historically has been difficult to control - she also has been noted to have significantly high bp in left arm compared to right, up to 30 mmHg. She denies any arm pain or weakness. Carotid US showed right subclavian stenosis.  - normal renin/aldo ratio, normal TSH.   - we decreased her aldactone to 12.5mg  daily due to increase in Cr.  - history of renal artetery stenosis with stenting to left renal artery, unable to stent right.    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 CKD - followed by Dr Wolfgang Phoenix   4.Renal artery stenosis bilateral -followed by vascular, stent to left renal artery 06/2022 -unable to pass wire across right renal artery, could conider repeat in future     5. History of CVA     6.Back surgery 01/2023   7. Carotid stenosis -mild by 05/2022 [place]  8. 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     Cholecalciferol 50 MCG (2000 UT) TABS Take 2,000 Units by mouth daily.     cloNIDine (CATAPRES) 0.1 MG tablet Take 0.1 mg by mouth 2 (two) times daily.     cyanocobalamin (VITAMIN B12) 1000 MCG tablet Take 1,000 mcg by mouth daily.      doxazosin (CARDURA) 2 MG tablet TAKE 1 TABLET BY MOUTH EVERY DAY 90 tablet 3   furosemide (LASIX) 20 MG tablet Take 40 mg by mouth daily.     labetalol (NORMODYNE) 200 MG tablet TAKE 1 TABLET BY MOUTH EVERY DAY 90 tablet 2   losartan (COZAAR) 25 MG tablet Take 12.5 mg by mouth daily.     MISC NATURAL PRODUCTS PO Take 4 tablets by mouth daily. Total Beets     multivitamin-iron-minerals-folic acid (CENTRUM) chewable tablet Chew 1 tablet by mouth daily.     NIFEdipine (ADALAT CC) 90 MG 24 hr tablet TAKE 1 TABLET BY MOUTH EVERY DAY 90 tablet 2   spironolactone (ALDACTONE) 25 MG tablet Take 25 mg by mouth daily.     No current facility-administered medications for this visit.     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  09/02/1990   LUMBAR LAMINECTOMY/DECOMPRESSION MICRODISCECTOMY Right 01/21/2023   Procedure: Laminectomy and Foraminotomy - right  Lumbar three-Lumbar four - Lumbar four-Lumbar five;  Surgeon: Julio Sicks, MD;  Location: [facility] OR;  Service: Neurosurgery;  Laterality: Right;   PERIPHERAL VASCULAR INTERVENTION Left 06/10/2022   Procedure: PERIPHERAL VASCULAR  INTERVENTION;  Surgeon: Maeola Harman, MD;  Location: [facility] INVASIVE CV LAB;  Service: Cardiovascular;  Laterality: Left;   RENAL ANGIOGRAPHY  06/10/2022   Procedure: RENAL ANGIOGRAPHY;  Surgeon: Maeola Harman, MD;  Location: [facility] INVASIVE CV LAB;  Service: Cardiovascular;;   SLT LASER APPLICATION Right 12/19/2014   Procedure: SLT LASER APPLICATION;  Surgeon: Susa Simmonds, MD;  Location: [facility] ORS;  Service: Ophthalmology;  Laterality: Right;   UMBILICAL HERNIA REPAIR  09/02/1978     Allergies  Allergen Reactions   Sodium Bicarbonate Itching   Pepperoni [Pickled Meat] Swelling   Penicillins Rash      Family History  Problem Relation Age of Onset   Hypertension Mother    Heart failure Mother     Diabetes Mother    Heart disease Mother    Hypertension Father    Cancer Father    Kidney disease Brother    Hypertension Brother    Stroke Brother      Social History Ms. Holding reports that she quit smoking about 49 years ago. Her smoking use included cigarettes. She started smoking about 53 years ago. She has a 0.8 pack-year smoking history. She has never been exposed to tobacco smoke. She has never used smokeless tobacco. Ms. Wilmore reports no history of alcohol use.   Review of Systems CONSTITUTIONAL: No weight loss, fever, chills, weakness or fatigue.  HEENT: Eyes: No visual loss, blurred vision, double vision or yellow sclerae.No hearing loss, sneezing, congestion, runny nose or sore throat.  SKIN: No rash or itching.  CARDIOVASCULAR: per hpi RESPIRATORY: No shortness of breath, cough or sputum.  GASTROINTESTINAL: No anorexia, nausea, vomiting or diarrhea. No abdominal pain or blood.  GENITOURINARY: No burning on urination, no polyuria NEUROLOGICAL: No headache, dizziness, syncope, paralysis, ataxia, numbness or tingling in the extremities. No change in bowel or bladder control.  MUSCULOSKELETAL: No muscle, back pain, joint pain or stiffness.  LYMPHATICS: No enlarged nodes. No history of splenectomy.  PSYCHIATRIC: No history of depression or anxiety.  ENDOCRINOLOGIC: No reports of sweating, cold or heat intolerance. No polyuria or polydipsia.  Marland Kitchen   Physical Examination Today's Vitals   07/02/23 1055 07/02/23 1103 07/02/23 1144  BP: (!) 160/100 (!) 160/100 (!) 165/85  Pulse: 60    SpO2: 99%    Weight: 132 lb 6.4 oz (60.1 kg)    Height: 5\' 4"  (1.626 m)     Body mass index is 22.73 kg/m.  Gen: resting comfortably, no acute distress HEENT: no scleral icterus, pupils equal round and reactive, no palptable cervical adenopathy,  CV: RRR, no m/rg, no jvd Resp: Clear to auscultation bilaterally GI: abdomen is soft, non-tender, non-distended, normal bowel sounds, no  hepatosplenomegaly MSK: extremities are warm, no edema.  Skin: warm, no rash Neuro:  no focal deficits Psych: appropriate affect   Diagnostic Studies  06/2015 echo Study Conclusions  - Left ventricle: The cavity size was normal. Wall thickness was   increased in a pattern of mild LVH. Systolic function was normal.   The estimated ejection fraction was in the range of 60% to 65%.   Wall motion was normal; there were no regional wall motion   abnormalities. Doppler parameters are consistent with abnormal   left ventricular relaxation (grade 1 diastolic dysfunction). - Aortic valve: Mildly calcified annulus. Trileaflet; mildly   calcified leaflets. There was trivial regurgitation. - Mitral valve: Calcified annulus. There was trivial regurgitation. - Right atrium: Central venous pressure (est): 3 mm Hg. - Atrial septum: No  defect or patent foramen ovale was identified. - Tricuspid valve: There was trivial regurgitation. - Pulmonary arteries: Systolic pressure could not be accurately   estimated. - Pericardium, extracardiac: There was no pericardial effusion.  Impressions:  - Mild LVH with LVEF 60-65% and grade 1 diastolic dysfunction. MAC   with trivial mitral regurgitation. Sclerotic aortic valve without   stenosis, trivial aortic regurgitation noted.     06/2015 Carotid US 1-39% bilateral ICA diseae, right subclavian stenosis   Assessment and Plan  1.Resistant HTN -bp's elevated in clinic, home numbers often at goal but at times elevated - GFR <30, would d/c her thiazide diuretic and start lasix 40mg  daily. Check bmet 2 weeks  2. HLD - history of CVA, LDL is above goal. Increase atorvastatin to 80mg  daily.   EKG today shows sinus brady at 53 with LVH   Antoine Poche, M.D.

## 2023-07-02 NOTE — Patient Instructions (Addendum)
Medication Instructions:  Your physician has recommended you make the following change in your medication:  Stop taking Chlorthalidone Increase Lasix to 40 mg once daily Increase Atorvastatin 80 mg once daily Continue taking all other medications as prescribed   Labwork: BMET in 2 weeks at [facility]  Testing/Procedures: None  Follow-Up: Your physician recommends that you schedule a follow-up appointment in: 3 months  Any Other Special Instructions Will Be Listed Below (If Applicable). Thank you for choosing  [facility]!      If you need a refill on your cardiac medications before your next appointment, please call your pharmacy.

## 2023-07-09 ENCOUNTER — Other Ambulatory Visit: Payer: 59

## 2023-07-10 LAB — BASIC METABOLIC PANEL
BUN/Creatinine Ratio: 16 (ref 12–28)
BUN: 42 mg/dL — ABNORMAL HIGH (ref 8–27)
CO2: 23 mmol/L (ref 20–29)
Calcium: 9.7 mg/dL (ref 8.7–10.3)
Chloride: 104 mmol/L (ref 96–106)
Creatinine, Ser: 2.59 mg/dL — ABNORMAL HIGH (ref 0.57–1.00)
Glucose: 84 mg/dL (ref 70–99)
Potassium: 5 mmol/L (ref 3.5–5.2)
Sodium: 142 mmol/L (ref 134–144)
eGFR: 18 mL/min/{1.73_m2} — ABNORMAL LOW (ref >59)

## 2023-07-18 ENCOUNTER — Other Ambulatory Visit: Payer: Self-pay | Admitting: Cardiology

## 2023-08-22 ENCOUNTER — Telehealth: Payer: Self-pay | Admitting: *Deleted

## 2023-08-22 NOTE — Telephone Encounter (Signed)
-----   Message from [facility] sent at 08/18/2023  7:20 AM EST -----  ----- Message ----- From: Antoine Poche, MD Sent: 08/17/2023   9:24 AM EST To: Sharen Hones  Labs show overall stable kidney dysfunction  Dominga Ferry MD

## 2023-08-22 NOTE — Telephone Encounter (Signed)
Notified, copy to pcp.

## 2023-09-08 ENCOUNTER — Ambulatory Visit: Payer: 59 | Admitting: Nurse Practitioner

## 2023-09-17 ENCOUNTER — Telehealth: Payer: Self-pay

## 2023-09-17 DIAGNOSIS — E785 Hyperlipidemia, unspecified: Secondary | ICD-10-CM

## 2023-09-17 DIAGNOSIS — I1A0 Resistant hypertension: Secondary | ICD-10-CM

## 2023-09-17 NOTE — Telephone Encounter (Signed)
 Copied from CRM [id number]. Topic: Clinical - Request for Lab/Test Order >> Sep 17, 2023 11:22 AM Tisa Forester wrote: Reason for CRM: patient requesting labs for blood work to be done on 09/19/23 , her provider Tommas Fragmin told her to schedule for labs

## 2023-09-17 NOTE — Telephone Encounter (Signed)
Future lab order placed.  

## 2023-09-17 NOTE — Addendum Note (Signed)
 Addended by: Kyra Phy on: 09/17/2023 04:46 PM   Modules accepted: Orders

## 2023-09-17 NOTE — Telephone Encounter (Signed)
 Copied from CRM [id number]. Topic: Clinical - Request for Lab/Test Order >> Sep 17, 2023 11:28 AM Tisa Forester wrote: Reason for CRM: please disregard the crm dated 09/17/23 done in error requesting for labs for bloodwork for patient for [facility] , patient stated its for her kidney doctor will

## 2023-09-19 ENCOUNTER — Other Ambulatory Visit: Payer: 59

## 2023-09-22 ENCOUNTER — Other Ambulatory Visit: Payer: 59

## 2023-09-23 ENCOUNTER — Ambulatory Visit: Payer: Commercial Managed Care - PPO | Attending: Nurse Practitioner | Admitting: Nurse Practitioner

## 2023-09-23 ENCOUNTER — Encounter: Payer: Self-pay | Admitting: Nurse Practitioner

## 2023-09-23 VITALS — BP 126/78 | HR 68 | Ht 64.0 in | Wt 130.0 lb

## 2023-09-23 DIAGNOSIS — I10 Essential (primary) hypertension: Secondary | ICD-10-CM | POA: Diagnosis not present

## 2023-09-23 DIAGNOSIS — I701 Atherosclerosis of renal artery: Secondary | ICD-10-CM | POA: Diagnosis not present

## 2023-09-23 DIAGNOSIS — I739 Peripheral vascular disease, unspecified: Secondary | ICD-10-CM

## 2023-09-23 DIAGNOSIS — Z79899 Other long term (current) drug therapy: Secondary | ICD-10-CM

## 2023-09-23 DIAGNOSIS — E785 Hyperlipidemia, unspecified: Secondary | ICD-10-CM

## 2023-09-23 DIAGNOSIS — I6523 Occlusion and stenosis of bilateral carotid arteries: Secondary | ICD-10-CM

## 2023-09-23 DIAGNOSIS — N184 Chronic kidney disease, stage 4 (severe): Secondary | ICD-10-CM

## 2023-09-23 DIAGNOSIS — Z8673 Personal history of transient ischemic attack (TIA), and cerebral infarction without residual deficits: Secondary | ICD-10-CM

## 2023-09-23 NOTE — Patient Instructions (Addendum)
Medication Instructions:  Your physician recommends that you continue on your current medications as directed. Please refer to the Current Medication list given to you today.  Labwork: In 2 weeks at [facility]   Testing/Procedures: None    Follow-Up: Your physician recommends that you schedule a follow-up appointment in: 4-6 months   Any Other Special Instructions Will Be Listed Below (If Applicable).  If you need a refill on your cardiac medications before your next appointment, please call your pharmacy.

## 2023-09-23 NOTE — Progress Notes (Signed)
Cardiology Office Note:  .   Date:  09/23/2023 ID:  Stacie Acres, DOB 01-06-1946, MRN [id number] PCP: Junie Spencer, FNP  [facility] Providers Cardiologist:  Dina Rich, MD    History of Present Illness: Marland Kitchen   MELICENT EISENHUTH is a 78 y.o. female with a PMH of resistant hypertension, carotid artery stenosis, bilateral renal artery stenosis, subclavian stenosis, hyperlipidemia, history of CVA, and CKD (Dr. Wolfgang Phoenix), who presents today for 57-month follow-up.  Last seen by Dr. Dina Rich on June 05, 2023.  It was noted that her blood pressures were elevated in the clinic, at home blood pressures were often at goal, but sometimes were elevated.  She was instructed to stop taking chlorthalidone due to GFR of less than 30, was started on Lasix 40 mg daily.  Atorvastatin was increased to 80 mg daily.  Today she presents for 72-month follow-up appointment.  She states she is overall doing well, however admits to left groin pain that has been ongoing for the past 2 years and pain is noticed with movement particularly when turning in the bed. Denies any chest pain, shortness of breath, palpitations, syncope, presyncope, dizziness, orthopnea, PND, swelling or significant weight changes, acute bleeding, or claudication.   ROS: Negative. See HPI.   Studies Reviewed: .    Carotid duplex 05/2023:  Summary:  Right Carotid: Velocities in the right ICA are consistent with a 1-39%  stenosis.   Left Carotid: Velocities in the left ICA are consistent with a 1-39%  stenosis.   Vertebrals: Bilateral vertebral arteries demonstrate antegrade flow.  Subclavians: Left subclavian artery was stenotic. Right subclavian artery flow was disturbed.   *See table(s) above for measurements and observations.  Suggest follow up study in 12 months.    Renal artery duplex 02/2023:  Summary:  Renal:    Right: Abnormal size for the right kidney. RRV flow present. 1-59%         stenosis of the right  renal artery.  Left:  Abnormal size for the left kidney. Evidence of a > 60%         stenosis in the left renal artery. LRV flow present.         Suboptimal visualization of the left kidney. Further imaging         may be warranted.    *See table(s) above for measurements and observations.  Echo 06/2022:  1. Left ventricular ejection fraction, by estimation, is 60 to 65%. The  left ventricle has normal function. The left ventricle has no regional  wall motion abnormalities. There is moderate concentric left ventricular hypertrophy. Left ventricular diastolic parameters are consistent with Grade I diastolic dysfunction (impaired relaxation).   2. Right ventricular systolic function is normal. The right ventricular  size is normal. There is normal pulmonary artery systolic pressure. The estimated right ventricular systolic pressure is 18.7 mmHg.   3. The mitral valve is grossly normal. Mild mitral valve regurgitation.   4. The aortic valve is tricuspid. Aortic valve regurgitation is mild.  Aortic valve sclerosis/calcification is present, without any evidence of  aortic stenosis. Aortic regurgitation PHT measures 622 msec. Aortic valve mean gradient measures 5.0 mmHg.   5. The inferior vena cava is normal in size with greater than 50%  respiratory variability, suggesting right atrial pressure of 3 mmHg.   Comparison(s): Prior images unable to be directly viewed.  Vascular ultrasound lower extremity arterial bilateral 05/2022:  Summary:  Right: Total occlusion noted in the superficial femoral artery  and/or  popliteal artery. 30-49% stenosis noted in the anterior tibial artery.  30-49% stenosis noted in the posterior tibial artery.   Left: 75-99% stenosis noted in the common femoral artery. Total occlusion  noted in the superficial femoral artery and/or popliteal artery. 30-49%  stenosis noted in the popliteal artery. 30-49% stenosis noted in the  posterior tibial artery.  ABI's 05/2022:   Findings reported to [facility] notified over BP over at 10:30 am. Patient instructed to go home and call Phillips Hay Monday morning..  Summary:  Right: The right toe-brachial index is abnormal.  Although ankle brachial indices are within normal limits (0.95-1.29),  arterial Doppler waveforms at the ankle suggest some component of arterial  occlusive disease.  Left: Resting left ankle-brachial index indicates moderate left lower  extremity arterial disease. The left toe-brachial index is abnormal.  Physical Exam:   VS:  BP 126/78   Pulse 68   Ht 5\' 4"  (1.626 m)   Wt 130 lb (59 kg)   SpO2 98%   BMI 22.31 kg/m    Wt Readings from Last 3 Encounters:  09/23/23 130 lb (59 kg)  07/02/23 132 lb 6.4 oz (60.1 kg)  06/06/23 132 lb 9.6 oz (60.1 kg)    GEN: Well nourished, well developed in no acute distress NECK: No JVD; No carotid bruits CARDIAC: S1/S2, RRR, no murmurs, rubs, gallops RESPIRATORY:  Clear to auscultation without rales, wheezing or rhonchi  ABDOMEN: Soft, non-tender, non-distended EXTREMITIES:  No edema; No deformity   ASSESSMENT AND PLAN: .    HTN BP stable and improved/at goal. Discussed to monitor BP at home at least 2 hours after medications and sitting for 5-10 minutes. Continue clonidine, Cardura, Labetalol, Losartan, Nifedipine, and Aldactone. Heart healthy diet and regular cardiovascular exercise encouraged.   Carotid artery stenosis, bilateral renal artery stenosis Carotid duplex 05/2023 revealed mild 1-39% bilateral carotid artery stenosis with stenosis of left subclavian artery. Renal artery duplex 02/2023 revealed 1-59% stenosis of right renal artery and > 60% stenosis of left renal artery. LDL 12/2022 was 94 and not at goal. Atorvastatin increased at last office visit. Will obtain FLP/LFT per protocol. Denies any symptoms. No medication changes at this time. Heart healthy diet and regular cardiovascular exercise encouraged.   PAD, HLD, medication  management In addition to vascular studies above, vascular ultrasound lower extremity arterial duplex 05/2022 revealed total occlusion noted in right superficial femoral artery and/or popliteal artery, 30 to 49% stenosis noted in the right anterior tibial artery and 30 to 49% stenosis noted in the posterior tibial artery.  On the left side, she was noted to have 7099% stenosis along the common femoral artery with total occlusion noted in the left superficial femoral artery and/or popliteal artery, 30 to 49% stenosis noted in the popliteal artery, 30 to 49% stenosis in the left posterior tibial artery.  She admits to chronic, stable left groin pain.  She has upcoming appointment with [facility].  Obtaining labs as mentioned above.  No medication changes at this time.  Care and ED precautions discussed.  Hx of CVA Denies any issues.  Continue current medication regimen.  Continue follow-up with PCP.  Care and ED precautions discussed.  CKD stage IV Most recent sCr was 2.35 with eGFR at 21.  Avoid nephrotoxic agents.  Continue follow-up with Dr. Wolfgang Phoenix.  No medication changes at this time.   Dispo: Follow-up with Dr. Dina Rich or APP in 4 to 6 months or sooner if anything changes.  Truddie Coco  Philis Nettle, NP

## 2023-09-25 ENCOUNTER — Other Ambulatory Visit: Payer: Commercial Managed Care - PPO

## 2023-09-26 LAB — LIPID PANEL
Chol/HDL Ratio: 4.9 {ratio} — ABNORMAL HIGH (ref 0.0–4.4)
Cholesterol, Total: 199 mg/dL (ref 100–199)
HDL: 41 mg/dL (ref >39)
LDL Chol Calc (NIH): 134 mg/dL — ABNORMAL HIGH (ref 0–99)
Triglycerides: 133 mg/dL (ref 0–149)
VLDL Cholesterol Cal: 24 mg/dL (ref 5–40)

## 2023-09-26 LAB — HEPATIC FUNCTION PANEL
ALT: 10 [IU]/L (ref 0–32)
AST: 14 [IU]/L (ref 0–40)
Albumin: 4.2 g/dL (ref 3.8–4.8)
Alkaline Phosphatase: 115 [IU]/L (ref 44–121)
Bilirubin Total: 0.4 mg/dL (ref 0.0–1.2)
Bilirubin, Direct: 0.14 mg/dL (ref 0.00–0.40)
Total Protein: 6.6 g/dL (ref 6.0–8.5)

## 2023-09-30 ENCOUNTER — Other Ambulatory Visit: Payer: Self-pay | Admitting: Nurse Practitioner

## 2023-09-30 DIAGNOSIS — E785 Hyperlipidemia, unspecified: Secondary | ICD-10-CM

## 2023-10-02 ENCOUNTER — Ambulatory Visit: Payer: 59 | Admitting: Nurse Practitioner

## 2023-10-06 ENCOUNTER — Other Ambulatory Visit: Payer: Commercial Managed Care - PPO

## 2023-10-15 ENCOUNTER — Ambulatory Visit: Payer: 59 | Admitting: Nurse Practitioner

## 2023-10-24 ENCOUNTER — Other Ambulatory Visit: Payer: Commercial Managed Care - PPO

## 2023-11-11 ENCOUNTER — Ambulatory Visit
Payer: Commercial Managed Care - PPO | Attending: Cardiovascular Disease | Admitting: Pharmacist Clinician (PhC)/ Clinical Pharmacy Specialist

## 2023-11-11 ENCOUNTER — Other Ambulatory Visit: Payer: Self-pay | Admitting: Pharmacist Clinician (PhC)/ Clinical Pharmacy Specialist

## 2023-11-11 ENCOUNTER — Telehealth: Payer: Self-pay | Admitting: Pharmacy Technician

## 2023-11-11 ENCOUNTER — Encounter: Payer: Self-pay | Admitting: Pharmacist Clinician (PhC)/ Clinical Pharmacy Specialist

## 2023-11-11 DIAGNOSIS — E785 Hyperlipidemia, unspecified: Secondary | ICD-10-CM

## 2023-11-11 NOTE — Progress Notes (Signed)
 Office Visit    Patient Name: Andrea Ross Date of Encounter: 11/11/2023  Primary Care Provider:  Junie Spencer, FNP Primary Cardiologist:  Dina Rich, MD  Chief Complaint    Hyperlipidemia   Significant Past Medical History   HTN Controlled, on losartan, nifedipine, labetalol, doxazosin and clonidine  ASCVD Carotid stenosis, bilateral RAS, subclavian stenosis, PAD  CVA 2014  CKD Stage IV, SCr 2.35, GFR 21     Allergies  Allergen Reactions   Sodium Bicarbonate Itching   Pepperoni [Pickled Meat] Swelling   Penicillins Rash    History of Present Illness    Andrea Ross is a 78 y.o. female patient of Dr Wyline Mood, in the office today to discuss options for cholesterol management. She is here today with her younger sister, with whom she is now living.  In reviewing her chart, LDL cholesterol has been as high as 181 in the past, but running between 100-130's with atorvastatin.  In October dose was increased from 40 to 80 mg daily  Insurance Carrier:  [employer] Medicare through prior employer  Healthwell:    yes, no MyChart access  LDL Cholesterol goal:  LDL < 55  Current Medications:   atorvastatin 80 mg every day,   Family Hx: mother had heart issus, angina, died at 70, multiple issues; father died cancer; older brother with MI, since died; sister with htn; 2 children, no known heart issues  Social Hx: Tobacco: no Alcohol: no    Diet: more home cooked, buys food that she can heat, hard to stand for long ;  pot pies, soups; not much fruits and vegetables, doesn't snack much   Accessory Clinical Findings   Lab Results  Component Value Date   CHOL 199 09/25/2023   HDL 41 09/25/2023   LDLCALC 134 (H) 09/25/2023   TRIG 133 09/25/2023   CHOLHDL 4.9 (H) 09/25/2023    No results found for: "LIPOA"  Lab Results  Component Value Date   ALT 10 09/25/2023   AST 14 09/25/2023   GGT 14 03/16/2019   ALKPHOS 115 09/25/2023   BILITOT 0.4 09/25/2023   Lab Results   Component Value Date   CREATININE 2.59 (H) 07/09/2023   BUN 42 (H) 07/09/2023   NA 142 07/09/2023   K 5.0 07/09/2023   CL 104 07/09/2023   CO2 23 07/09/2023   Lab Results  Component Value Date   HGBA1C 5.3 07/03/2022    Home Medications    Current Outpatient Medications  Medication Sig Dispense Refill   atorvastatin (LIPITOR) 80 MG tablet Take 1 tablet (80 mg total) by mouth daily. 90 tablet 3   Calcium Carbonate-Vit D-Min (CALCIUM 600+D3 PLUS MINERALS) 600-800 MG-UNIT TABS Take 1 tablet by mouth daily.     Cholecalciferol 50 MCG (2000 UT) TABS Take 2,000 Units by mouth daily.     cloNIDine (CATAPRES) 0.1 MG tablet Take 0.1 mg by mouth 2 (two) times daily.     cyanocobalamin (VITAMIN B12) 1000 MCG tablet Take 1,000 mcg by mouth daily.     doxazosin (CARDURA) 2 MG tablet TAKE 1 TABLET BY MOUTH EVERY DAY 90 tablet 3   furosemide (LASIX) 20 MG tablet Take 2 tablets (40 mg total) by mouth daily. 60 tablet 3   labetalol (NORMODYNE) 200 MG tablet TAKE 1 TABLET BY MOUTH EVERY DAY 90 tablet 2   losartan (COZAAR) 25 MG tablet Take 12.5 mg by mouth daily.     MISC NATURAL PRODUCTS PO Take 4 tablets by  mouth daily. Total Beets     multivitamin-iron-minerals-folic acid (CENTRUM) chewable tablet Chew 1 tablet by mouth daily.     NIFEdipine (ADALAT CC) 90 MG 24 hr tablet TAKE 1 TABLET BY MOUTH EVERY DAY 90 tablet 2   spironolactone (ALDACTONE) 25 MG tablet Take 25 mg by mouth daily.     No current facility-administered medications for this visit.     Assessment & Plan    Hyperlipidemia Assessment: Patient with ASCVD not at LDL goal of < 55 Most recent LDL 134 on 09/25/23 Has been compliant with high intensity statin : atorvastatin 80 mg daily Reviewed options for lowering LDL cholesterol, including ezetimibe, PCSK-9 inhibitors and inclisiran.  Discussed mechanisms of action, dosing, side effects, potential decreases in LDL cholesterol and costs.  Also reviewed potential options for  patient assistance.  Plan: Patient agreeable to starting [facility] - signed consent for insurance verification Repeat labs after:  second dose Lipid Liver function    Phillips Hay, PharmD CPP [facility] [address]  [place], [place] [address] [phone]  11/11/2023, 11:04 AM

## 2023-11-11 NOTE — Assessment & Plan Note (Signed)
 Assessment: Patient with ASCVD not at LDL goal of < 55 Most recent LDL 134 on 09/25/23 Has been compliant with high intensity statin : atorvastatin 80 mg daily Reviewed options for lowering LDL cholesterol, including ezetimibe, PCSK-9 inhibitors and inclisiran.  Discussed mechanisms of action, dosing, side effects, potential decreases in LDL cholesterol and costs.  Also reviewed potential options for patient assistance.  Plan: Patient agreeable to starting Leqvio - signed consent for insurance verification Repeat labs after:  second dose Lipid Liver function

## 2023-11-11 NOTE — Patient Instructions (Signed)
 Your Results:             Your most recent labs Goal  Total Cholesterol 199 < 200  Triglycerides 133 < 150  HDL (happy/good cholesterol) 41 > 40  LDL (lousy/bad cholesterol 134 < 55   Medication changes:  We will start the process to get Leqviocovered by your insurance.  Once the prior authorization is complete, I will call to let you know and confirm pharmacy information.   The infusion center will reach out to you to schedule this appointment.   Lab orders:  We want to repeat labs after 2-3 months.  We will send you a lab order to remind you once we get closer to that time.     Thank you for choosing [facility]

## 2023-11-11 NOTE — Telephone Encounter (Signed)
 Auth Submission: NO AUTH NEEDED Site of care: Site of care: [facility] Payer: umr Medication & CPT/J Code(s) submitted: Leqvio (Inclisiran) [id number] Route of submission (phone, fax, portal):  Phone #[phone] Fax # Auth type: Buy/Bill PB Units/visits requested: 2  Reference number: [id number] Chelsy-R 1:44p Approval from: 11/11/23 to 09/01/24

## 2023-11-12 ENCOUNTER — Other Ambulatory Visit: Payer: Self-pay

## 2023-11-12 DIAGNOSIS — I701 Atherosclerosis of renal artery: Secondary | ICD-10-CM

## 2023-11-13 ENCOUNTER — Telehealth: Payer: Self-pay

## 2023-11-13 NOTE — Telephone Encounter (Signed)
 Auth Submission: NO AUTH NEEDED Site of care: Site of care: [facility] Payer: umr Medication & CPT/J Code(s) submitted: Leqvio (Inclisiran) [id number] Route of submission (phone, fax, portal):  Phone #[phone] Fax # Auth type: Buy/Bill PB Units/visits requested: 2  Reference number: [id number] Chelsy-R 1:44p Approval from: 11/11/23 to 09/01/24

## 2023-11-19 ENCOUNTER — Ambulatory Visit (INDEPENDENT_AMBULATORY_CARE_PROVIDER_SITE_OTHER): Payer: 59

## 2023-11-19 ENCOUNTER — Ambulatory Visit (HOSPITAL_COMMUNITY)
Admission: RE | Admit: 2023-11-19 | Discharge: 2023-11-19 | Disposition: A | Discharge status: Home / Self Care | Payer: 59 | Source: Ambulatory Visit | Attending: Vascular Surgery | Admitting: Vascular Surgery

## 2023-11-19 VITALS — BP 191/128 | HR 79 | Temp 97.9°F | Ht 64.0 in | Wt 124.3 lb

## 2023-11-19 DIAGNOSIS — I701 Atherosclerosis of renal artery: Secondary | ICD-10-CM | POA: Insufficient documentation

## 2023-11-19 NOTE — Progress Notes (Signed)
 [facility] HISTORY AND PHYSICAL   History of Present Illness:  Patient is a 78 y.o. year old female who presents for evaluation of  Renal artery stenosis.  S/P  post renal angiography with left renal artery stenting by Dr. Caroline More 06/10/2022.  This was performed due to sustained systolic blood pressures above 220.  She states her BP has been controlled well in the 130's systolic.  Blood pressure significantly improved since renal artery stenting. Dr. Randie Heinz also noticed 90% stenosis of the right renal artery origin. He will consider repeating angiogram and intervening on right renal artery if blood pressure did not show any improvement.   On a daily basis she denies claudication, rest pain or non healing wounds. She has left hip/buttock pain with ambulation due to lumbar spinal stenosis and foraminal narrowing causing radicular pain.  She states she was unable to take her BP medications this am due to fasting for her study.  If she does not eat she gets sick to her stomach when she takes he pills.  On a regular basis her systolic pressure is 120-130's.    She is medically managed on a ASA, statin and anti hypertension medications.        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  09/02/1990   LUMBAR LAMINECTOMY/DECOMPRESSION MICRODISCECTOMY Right 01/21/2023   Procedure: Laminectomy and Foraminotomy - right  Lumbar three-Lumbar four - Lumbar four-Lumbar five;  Surgeon: Julio Sicks, MD;  Location: [facility] OR;  Service: Neurosurgery;  Laterality: Right;   PERIPHERAL VASCULAR INTERVENTION Left 06/10/2022   Procedure:  PERIPHERAL VASCULAR INTERVENTION;  Surgeon: Maeola Harman, MD;  Location[facility] INVASIVE CV LAB;  Service: Cardiovascular;  Laterality: Left;   RENAL ANGIOGRAPHY  06/10/2022   Procedure: RENAL ANGIOGRAPHY;  Surgeon: Maeola Harman, MD;  Location: [facility] INVASIVE CV LAB;  Service: Cardiovascular;;   SLT LASER APPLICATION Right 12/19/2014   Procedure: SLT LASER APPLICATION;  Surgeon: Susa Simmonds, MD;  Location: [facility] ORS;  Service: Ophthalmology;  Laterality: Right;   UMBILICAL HERNIA REPAIR  09/02/1978    ROS:   General:  No weight loss, Fever, chills  HEENT: No recent headaches, no nasal bleeding, no visual changes, no sore throat  Neurologic: No dizziness, blackouts, seizures. No recent symptoms of stroke or mini- stroke. No recent episodes of slurred speech, or temporary blindness.  Cardiac: No recent episodes of chest pain/pressure, no shortness of breath at rest.  No shortness of breath with exertion.  Denies history of atrial fibrillation or irregular heartbeat  Vascular: No history of rest pain in feet.  No history of claudication.  No history of non-healing ulcer, No history of DVT   Pulmonary: No home oxygen, no productive cough, no hemoptysis,  No asthma or wheezing  Musculoskeletal:  [ ]  Arthritis, [ ]  Low back pain,  [ ]  Joint pain  Hematologic:No history of hypercoagulable state.  No history of easy bleeding.  No history of anemia  Gastrointestinal: No hematochezia or melena,  No gastroesophageal reflux, no trouble swallowing  Urinary: [ ]  chronic Kidney disease, [ ]   on HD - [ ]  MWF or [ ]  TTHS, [ ]  Burning with urination, [ ]  Frequent urination, [ ]  Difficulty urinating;   Skin: No rashes  Psychological: No history of anxiety,  No history of depression  Social History Social History   Tobacco Use   Smoking status: Former    Current packs/day: 0.00    Average packs/day: 0.3 packs/day for 3.2 years (0.8 ttl pk-yrs)    Types: Cigarettes    Start  date: 07/03/1970    Quit date: 09/02/1973    Years since quitting: 50.2    Passive exposure: Never   Smokeless tobacco: Never  Vaping Use   Vaping status: Never Used  Substance Use Topics   Alcohol use: No    Alcohol/week: 0.0 standard drinks of alcohol   Drug use: No    Family History Family History  Problem Relation Age of Onset   Hypertension Mother    Heart failure Mother    Diabetes Mother    Heart disease Mother    Hypertension Father    Cancer Father    Kidney disease Brother    Hypertension Brother    Stroke Brother     Allergies  Allergies  Allergen Reactions   Sodium Bicarbonate Itching   Pepperoni [Pickled Meat] Swelling   Penicillins Rash     Current Outpatient Medications  Medication Sig Dispense Refill   Calcium Carbonate-Vit D-Min (CALCIUM 600+D3 PLUS MINERALS) 600-800 MG-UNIT TABS Take 1 tablet by mouth daily.     Cholecalciferol 50 MCG (2000 UT) TABS Take 2,000 Units by mouth daily.     cloNIDine (CATAPRES) 0.1 MG tablet Take 0.1 mg by mouth 2 (two) times daily.     cyanocobalamin (VITAMIN B12) 1000 MCG tablet Take 1,000 mcg by mouth daily.     doxazosin (CARDURA) 2 MG tablet TAKE 1 TABLET BY MOUTH EVERY DAY 90 tablet 3   furosemide (LASIX) 20 MG tablet Take 2 tablets (40 mg total) by mouth daily. 60 tablet 3   labetalol (NORMODYNE) 200 MG tablet TAKE 1 TABLET BY MOUTH EVERY DAY 90 tablet 2   losartan (COZAAR) 25 MG tablet Take 12.5 mg by mouth daily.     MISC NATURAL PRODUCTS PO Take 4 tablets by mouth daily. Total Beets     multivitamin-iron-minerals-folic acid (CENTRUM) chewable tablet Chew 1 tablet by mouth daily.     NIFEdipine (ADALAT CC) 90 MG 24 hr tablet TAKE 1 TABLET BY MOUTH EVERY DAY 90 tablet 2   spironolactone (ALDACTONE) 25 MG tablet Take 25 mg by mouth daily.     atorvastatin (LIPITOR) 80 MG tablet Take 1 tablet (80 mg total) by mouth daily. 90 tablet 3   No current facility-administered medications for this visit.    Physical  Examination  Vitals:   11/19/23 1039  Pulse: 79  Temp: 97.9 F (36.6 C)  TempSrc: Temporal  SpO2: 98%  Weight: 124 lb 4.8 oz (56.4 kg)  Height: 5\' 4"  (1.626 m)    Body mass index is 21.34 kg/m.  General:  Alert and oriented, no acute distress HEENT: Normal Neck: No bruit or JVD Pulmonary: Clear to auscultation bilaterally Cardiac: Regular Rate and Rhythm without murmur Abdomen: Soft, non-tender, non-distended, no mass, no scars Skin: No rash Extremity no ischemic changes Musculoskeletal: No deformity or edema  Neurologic: Upper and lower extremity motor 5/5 and symmetric  DATA:     Duplex Findings:  +----------+--------+--------+------+--------+  MesentericPSV cm/sEDV cm/sPlaqueComments  +----------+--------+--------+------+--------+  Aorta Mid   112                           +----------+--------+--------+------+--------+        +------------------+--------+--------+-------+  Right Renal ArteryPSV cm/sEDV cm/sComment  +------------------+--------+--------+-------+  Proximal           260      68            +------------------+--------+--------+-------+  Mid                114      34            +------------------+--------+--------+-------+  Distal              71      38            +------------------+--------+--------+-------+   +-----------------+--------+--------+----------+  Left Renal ArteryPSV cm/sEDV cm/s Comment    +-----------------+--------+--------+----------+  Proximal          455     139   stent area  +-----------------+--------+--------+----------+  Mid               292      90               +-----------------+--------+--------+----------+  Distal            125      41               +-----------------+--------+--------+----------+   +------------+--------+--------+----+-----------+--------+--------+----+  Right KidneyPSV cm/sEDV cm/sRI  Left KidneyPSV cm/sEDV cm/sRI     +------------+--------+--------+----+-----------+--------+--------+----+  Upper Pole  27      9       0.65Upper Pole 15      6       0.60  +------------+--------+--------+----+-----------+--------+--------+----+  Mid        20      9       0.        18      7       0.61  +------------+--------+--------+----+-----------+--------+--------+----+  Lower Pole  23      8       0.65Lower Pole 31      10      0.68  +------------+--------+--------+----+-----------+--------+--------+----+  Hilar      78      17      0.78Hilar                            +------------+--------+--------+----+-----------+--------+--------+----+   +------------------+----+------------------+----+  Right Kidney          Left Kidney             +------------------+----+------------------+----+  RAR                  RAR                     +------------------+----+------------------+----+  RAR (manual)      2.3 RAR (manual)      4.0   +------------------+----+------------------+----+  Cortex               Cortex                  +------------------+----+------------------+----+  Cortex thickness      Corex thickness         +------------------+----+------------------+----+  Kidney length (cm)8.21Kidney length (cm)9.07  +------------------+----+------------------+----+     Summary:  Renal:    Right: Evidence of a > 60% stenosis of the right renal artery. RRV         flow present. Abnormal size for the right kidney.  Left:  Evidence of a > 60% stenosis  in the left renal artery. Normal         size of left kidney. LRV flow present.   ASSESSMENT/PLAN:  78 y.o. female status post renal angiogram with left renal artery stenting; patient also experiencing right groin/hip pain due to spinal stenosis.   Her BP has improved when she takes her BP medications.  She missed them this am explanation above.  She has a sedentary lifestyle over all.  She will  occasionally walk through a store using a cart to hold on to.  I have asked her to monitor her BP at home and bring a weeks worth to the office with her next time.    Dr. Randie Heinz also noticed 90% stenosis of the right renal artery origin. He will consider repeating angiogram and intervening on right renal artery if blood pressure did not show any improvement.  There are no changes on the duplex compared to lats visit 6 months ago.  F/U in 6 months for repeat duplex.        Mosetta Pigeon PA-C [facility] Office: [phone]  MD in clinic [place]

## 2023-11-20 ENCOUNTER — Ambulatory Visit

## 2023-11-21 ENCOUNTER — Other Ambulatory Visit: Payer: Self-pay | Admitting: *Deleted

## 2023-11-21 DIAGNOSIS — I701 Atherosclerosis of renal artery: Secondary | ICD-10-CM

## 2023-11-22 ENCOUNTER — Emergency Department (HOSPITAL_COMMUNITY)

## 2023-11-22 ENCOUNTER — Encounter (HOSPITAL_COMMUNITY): Payer: Self-pay | Admitting: *Deleted

## 2023-11-22 ENCOUNTER — Other Ambulatory Visit: Payer: Self-pay

## 2023-11-22 ENCOUNTER — Emergency Department (HOSPITAL_COMMUNITY): Admission: EM | Admit: 2023-11-22 | Discharge: 2023-11-22 | Disposition: A | Discharge status: Home / Self Care

## 2023-11-22 DIAGNOSIS — R0789 Other chest pain: Secondary | ICD-10-CM | POA: Diagnosis present

## 2023-11-22 DIAGNOSIS — Z79899 Other long term (current) drug therapy: Secondary | ICD-10-CM | POA: Insufficient documentation

## 2023-11-22 DIAGNOSIS — N184 Chronic kidney disease, stage 4 (severe): Secondary | ICD-10-CM | POA: Insufficient documentation

## 2023-11-22 DIAGNOSIS — S32010A Wedge compression fracture of first lumbar vertebra, initial encounter for closed fracture: Secondary | ICD-10-CM | POA: Insufficient documentation

## 2023-11-22 DIAGNOSIS — Z8673 Personal history of transient ischemic attack (TIA), and cerebral infarction without residual deficits: Secondary | ICD-10-CM | POA: Insufficient documentation

## 2023-11-22 DIAGNOSIS — Y92410 Unspecified street and highway as the place of occurrence of the external cause: Secondary | ICD-10-CM | POA: Insufficient documentation

## 2023-11-22 DIAGNOSIS — I509 Heart failure, unspecified: Secondary | ICD-10-CM | POA: Diagnosis not present

## 2023-11-22 DIAGNOSIS — I130 Hypertensive heart and chronic kidney disease with heart failure and stage 1 through stage 4 chronic kidney disease, or unspecified chronic kidney disease: Secondary | ICD-10-CM | POA: Diagnosis not present

## 2023-11-22 DIAGNOSIS — S2242XA Multiple fractures of ribs, left side, initial encounter for closed fracture: Secondary | ICD-10-CM | POA: Diagnosis not present

## 2023-11-22 DIAGNOSIS — S32019A Unspecified fracture of first lumbar vertebra, initial encounter for closed fracture: Secondary | ICD-10-CM

## 2023-11-22 LAB — CBC WITH DIFFERENTIAL/PLATELET
Abs Immature Granulocytes: 0.02 10*3/uL (ref 0.00–0.07)
Basophils Absolute: 0 10*3/uL (ref 0.0–0.1)
Basophils Relative: 0 %
Eosinophils Absolute: 0 10*3/uL (ref 0.0–0.5)
Eosinophils Relative: 1 %
HCT: 36.7 % (ref 36.0–46.0)
Hemoglobin: 11.7 g/dL — ABNORMAL LOW (ref 12.0–15.0)
Immature Granulocytes: 0 %
Lymphocytes Relative: 17 %
Lymphs Abs: 1 10*3/uL (ref 0.7–4.0)
MCH: 28.3 pg (ref 26.0–34.0)
MCHC: 31.9 g/dL (ref 30.0–36.0)
MCV: 88.9 fL (ref 80.0–100.0)
Monocytes Absolute: 0.7 10*3/uL (ref 0.1–1.0)
Monocytes Relative: 12 %
Neutro Abs: 4.1 10*3/uL (ref 1.7–7.7)
Neutrophils Relative %: 70 %
Platelets: 179 10*3/uL (ref 150–400)
RBC: 4.13 MIL/uL (ref 3.87–5.11)
RDW: 14 % (ref 11.5–15.5)
WBC: 5.8 10*3/uL (ref 4.0–10.5)
nRBC: 0 % (ref 0.0–0.2)

## 2023-11-22 LAB — BASIC METABOLIC PANEL
Anion gap: 14 (ref 5–15)
BUN: 35 mg/dL — ABNORMAL HIGH (ref 8–23)
CO2: 22 mmol/L (ref 22–32)
Calcium: 9.5 mg/dL (ref 8.9–10.3)
Chloride: 104 mmol/L (ref 98–111)
Creatinine, Ser: 1.96 mg/dL — ABNORMAL HIGH (ref 0.44–1.00)
GFR, Estimated: 25 mL/min — ABNORMAL LOW (ref >60)
Glucose, Bld: 93 mg/dL (ref 70–99)
Potassium: 4.1 mmol/L (ref 3.5–5.1)
Sodium: 140 mmol/L (ref 135–145)

## 2023-11-22 MED ORDER — ACETAMINOPHEN 325 MG PO TABS: 650.0000 mg | ORAL_TABLET | Freq: Four times a day (QID) | ORAL | 0 refills | Status: AC | PRN

## 2023-11-22 MED ORDER — ACETAMINOPHEN 325 MG PO TABS
650.0000 mg | ORAL_TABLET | Freq: Once | ORAL | Status: AC
  Administered 2023-11-22: 650 mg via ORAL
  Filled 2023-11-22: qty 2

## 2023-11-22 MED ORDER — POLYETHYLENE GLYCOL 3350 17 G PO PACK: 17.0000 g | PACK | Freq: Every day | ORAL | 0 refills | Status: DC

## 2023-11-22 MED ORDER — OXYCODONE-ACETAMINOPHEN 5-325 MG PO TABS
1.0000 | ORAL_TABLET | ORAL | Status: DC | PRN
  Administered 2023-11-22: 1 via ORAL
  Filled 2023-11-22 (×2): qty 1

## 2023-11-22 MED ORDER — LIDOCAINE 5 % EX PTCH: 1.0000 | MEDICATED_PATCH | CUTANEOUS | 0 refills | Status: AC

## 2023-11-22 MED ORDER — LOSARTAN POTASSIUM 25 MG PO TABS
25.0000 mg | ORAL_TABLET | Freq: Once | ORAL | Status: AC
  Administered 2023-11-22: 25 mg via ORAL
  Filled 2023-11-22: qty 1

## 2023-11-22 MED ORDER — FENTANYL CITRATE PF 50 MCG/ML IJ SOSY
25.0000 ug | PREFILLED_SYRINGE | Freq: Once | INTRAMUSCULAR | Status: AC
  Administered 2023-11-22: 25 ug via INTRAVENOUS
  Filled 2023-11-22: qty 1

## 2023-11-22 MED ORDER — CLONIDINE HCL 0.1 MG PO TABS
0.1000 mg | ORAL_TABLET | Freq: Once | ORAL | Status: AC
  Administered 2023-11-22: 0.1 mg via ORAL
  Filled 2023-11-22: qty 1

## 2023-11-22 MED ORDER — LIDOCAINE 5 % EX PTCH
1.0000 | MEDICATED_PATCH | CUTANEOUS | Status: DC
  Administered 2023-11-22: 1 via TRANSDERMAL
  Filled 2023-11-22: qty 1

## 2023-11-22 MED ORDER — OXYCODONE HCL 5 MG PO TABS: 2.5000 mg | ORAL_TABLET | Freq: Four times a day (QID) | ORAL | 0 refills | Status: DC | PRN

## 2023-11-22 NOTE — ED Notes (Signed)
 Relates high BP to pain

## 2023-11-22 NOTE — Progress Notes (Signed)
 Orthopedic Tech Progress Note Patient Details:  Andrea Ross 1945/11/07 [id number] LSO back brace has been ordered from [facility]  Patient ID: Stacie Acres, female   DOB: [birth year], 78 y.o.   MRN: [id number]  Smitty Pluck 11/22/2023, 2:37 PM
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
## 2023-11-22 NOTE — ED Notes (Signed)
 Patient had LSO brace applied and walked around and patient stated she feels much better

## 2023-11-22 NOTE — Progress Notes (Signed)
 Patient ID: Andrea Ross, female   DOB: 15-Mar-1946, 78 y.o.   MRN: [id number] Films reviewed. LSO should be sufficient.  By report moving all extremities, no need for operative treatment at this time.

## 2023-11-22 NOTE — ED Notes (Signed)
Ortho tech called for LSO brace.
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
## 2023-11-22 NOTE — ED Notes (Signed)
 Ortho teach called back and said it was to be a few hours before they would be here to place LSO brace.

## 2023-12-01 ENCOUNTER — Telehealth: Payer: Self-pay | Admitting: Family

## 2023-12-01 NOTE — Telephone Encounter (Signed)
 Apt scheduled  Copied from CRM [id number]. Topic: General - Other >> Dec 01, 2023  8:33 AM Carlatta H wrote: Reason for CRM: Please call the patient @ [phone] to schedule a hospital follow up// Per Epic Schedule appointment within 14 days. If there are no openings, send a message to the appropriate ADMIN pool. Most recent Primary Care Visit:

## 2023-12-05 ENCOUNTER — Ambulatory Visit: Payer: 59 | Admitting: Family

## 2023-12-12 ENCOUNTER — Encounter: Payer: Self-pay | Admitting: Family

## 2023-12-12 ENCOUNTER — Ambulatory Visit: Admitting: Family

## 2023-12-12 VITALS — BP 185/81 | HR 73 | Ht 64.0 in | Wt 120.6 lb

## 2023-12-12 DIAGNOSIS — S2249XD Multiple fractures of ribs, unspecified side, subsequent encounter for fracture with routine healing: Secondary | ICD-10-CM

## 2023-12-12 DIAGNOSIS — Z09 Encounter for follow-up examination after completed treatment for conditions other than malignant neoplasm: Secondary | ICD-10-CM

## 2023-12-12 DIAGNOSIS — S32000D Wedge compression fracture of unspecified lumbar vertebra, subsequent encounter for fracture with routine healing: Secondary | ICD-10-CM | POA: Diagnosis not present

## 2023-12-12 DIAGNOSIS — I10 Essential (primary) hypertension: Secondary | ICD-10-CM | POA: Diagnosis not present

## 2023-12-12 NOTE — Patient Instructions (Signed)
 Rib Fracture  A rib fracture is a break or crack in one of the bones of the ribs. The ribs are long, curved bones that wrap around your chest and attach to your spine and your breastbone. The ribs protect your heart, lungs, and other organs in the chest. A broken or cracked rib is often painful but is not usually serious. Most rib fractures heal on their own over time. However, rib fractures can be more serious if multiple ribs are broken or if broken ribs move out of place and push against other structures or organs. What are the causes? This condition is caused by: Repetitive movements with high force, such as pitching a baseball or having very bad coughing spells. A direct hit the chest, such as a sports injury, a car crash, or a fall. Cancer that has spread to the bones, which can weaken bones and cause them to break. What are the signs or symptoms? Symptoms of this condition include: Pain when you breathe in or cough. Pain when someone presses on the injured area. Feeling short of breath. How is this diagnosed? This condition is diagnosed with a physical exam and medical history. You may also have imaging tests, such as: Chest X-ray. CT scan. MRI. Bone scan. Chest ultrasound. How is this treated? Treatment for this condition depends on the severity of the fracture. Most rib fractures usually heal on their own in 1-3 months. Healing may take longer if you have a cough or if you do activities that make the injury worse. While you heal, you may be given medicines to control the pain. You will also be taught deep breathing exercises. Severe injuries may require hospitalization or surgery. Follow these instructions at home: Managing pain, stiffness, and swelling If directed, put ice on the injured area. To do this: Put ice in a plastic bag. Place a towel between your skin and the bag. Leave the ice on for 20 minutes, 2-3 times a day. Remove the ice if your skin turns bright red. This is  very important. If you cannot feel pain, heat, or cold, you have a greater risk of damage to the area. Take over-the-counter and prescription medicines only as told by your health care provider. Activity Avoid doing activities or movements that cause pain. Be careful during activities and avoid bumping the injured rib. Slowly increase your activity as told by your health care provider. General instructions Do deep breathing exercises as told by your health care provider. This helps prevent pneumonia, which is a common complication of a broken rib. Your health care provider may instruct you to: Take deep breaths several times a day. Try to cough several times a day, holding a pillow against the injured area. Use a device called incentive spirometer to practice deep breathing several times a day. Drink enough fluid to keep your urine pale yellow. Do not wear a rib belt or binder. These restrict breathing, which can lead to pneumonia. Keep all follow-up visits. This is important. Contact a health care provider if: You have a fever. Get help right away if: You have difficulty breathing or you are short of breath. You develop a cough that does not stop, or you cough up thick or bloody sputum. You have nausea, vomiting, or pain in your abdomen. Your pain gets worse and medicine does not help. These symptoms may represent a serious problem that is an emergency. Do not wait to see if the symptoms will go away. Get medical help right away. Call  your local emergency services (911 in the [place] Do not drive yourself to the hospital. Summary A rib fracture is a break or crack in one of the bones of the ribs. A broken or cracked rib is often painful but is not usually serious. Most rib fractures heal on their own over time. Treatment for this condition depends on the severity of the fracture. Avoid doing any activities or movements that cause pain. This information is not intended to replace advice  given to you by your health care provider. Make sure you discuss any questions you have with your health care provider.  Spinal Compression Fracture  A spinal compression fracture is a collapse of the bones that form the spine (vertebrae). With this type of fracture, the vertebrae become pushed (compressed) into a wedge shape. Most compression fractures happen in the middle or lower part of the spine. What are the causes? This condition may be caused by: Thinning and loss of density in the bones (osteoporosis). This is the most common cause. A fall. A car or motorcycle accident. Cancer. Trauma, such as a heavy, direct hit to the head or back. What increases the risk? You are more likely to develop this condition if: You are 12 years of age or older. You have osteoporosis. You have certain types of cancer, including: Multiple myeloma. Lymphoma. Prostate cancer. Lung cancer. Breast cancer. What are the signs or symptoms? Symptoms of this condition include: Severe pain with simple movements such as coughing or sneezing. Pain that gets worse over time. Pain that is worse when you stand, walk, sit, or bend. Sudden pain that is so bad that it is hard for you to move. Bending or humping of the spine. Gradual loss of height. Numbness, tingling, or weakness in the back and legs. Trouble walking. Your symptoms will depend on the cause of the fracture and how quickly it develops. How is this diagnosed? This condition may be diagnosed based on symptoms, medical history, and a physical exam. During the physical exam, your health care provider may tap along the length of your spine to check for tenderness. Tests may be done to confirm the diagnosis. They may include: A bone mineral density test to check for osteoporosis. Imaging tests, such as a spine X-ray, CT scan, or MRI. How is this treated? Treatment depends on the cause and severity of the condition. Some fractures may heal on their own  with supportive care. Treatment may include: Pain medicine. Rest. A back brace. Physical therapy exercises. Medicine to strengthen bone. Calcium and vitamin D supplements. Fractures that cause the back to become misshapen, cause nerve pain or weakness, or do not respond to other treatment may be treated with surgery. This may include: Vertebroplasty. Bone cement is injected into the collapsed vertebrae to stabilize them. Balloon kyphoplasty. The collapsed vertebrae are expanded with a balloon and then bone cement is injected into them. Spinal fusion. The collapsed vertebrae are connected (fused) to normal vertebrae. Follow these instructions at home: Medicines Take over-the-counter and prescription medicines only as told by your health care provider. Ask your health care provider if the medicine prescribed to you: Requires you to avoid driving or using machinery. Can cause constipation. You may need to take these actions to prevent or treat constipation: Drink enough fluid to keep your urine pale yellow. Take over-the-counter or prescription medicines. Eat foods that are high in fiber, such as beans, whole grains, and fresh fruits and vegetables. Limit foods that are high in fat and  processed sugars, such as fried or sweet foods. If you have a brace: Wear the brace as told by your health care provider. Remove it only as told by your health care provider. Loosen the brace if your fingers or toes tingle, become numb, or turn cold and blue. Keep the brace clean. If the brace is not waterproof: Do not let it get wet. Cover it with a watertight covering when you take a bath or a shower. Managing pain, stiffness, and swelling  If directed, put ice on the injured area. To do this: If you have a removable brace, remove it as told by your health care provider. Put ice in a plastic bag. Place a towel between your skin and the bag. Leave the ice on for 20 minutes, 2-3 times a day. Remove the  ice if your skin turns bright red. This is very important. If you cannot feel pain, heat, or cold, you have a greater risk of damage to the area. Activity Rest as told by your health care provider. Avoid sitting for a long time without moving. Get up to take short walks every 1-2 hours. This is important to improve blood flow and breathing. Ask for help if you feel weak or unsteady. Return to your normal activities as told by your health care provider. Ask what activities are safe for you. Do physical therapy exercises to improve movement and strength in your back, as recommended by your health care provider. Exercise regularly as directed by your health care provider. General instructions  Do not drink alcohol. Alcohol can interfere with your treatment. Do not use any products that contain nicotine or tobacco, such as cigarettes, e-cigarettes, and chewing tobacco. These can delay bone healing. If you need help quitting, ask your health care provider. Keep all follow-up visits. This is important. It can help to prevent permanent injury, disability, and long-lasting (chronic) pain. Contact a health care provider if: You have a fever. Your pain medicine is not helping. Your pain does not get better over time. You cannot return to your normal activities as planned or expected. Get help right away if: Your pain is very bad and it suddenly gets worse. You are unable to move any body part (paralysis) that is below the level of your injury. You have numbness, tingling, or weakness in any body part that is below the level of your injury. You cannot control your bladder or bowels. Summary A spinal compression fracture is a collapse of the bones that form the spine (vertebrae). With this type of fracture, the vertebrae become pushed (compressed) into a wedge shape. Your symptoms and treatment will depend on the cause and severity of the fracture and how quickly it develops. Some fractures may heal on  their own with supportive care. Fractures that cause the back to become misshapen, cause nerve pain or weakness, or do not respond to other treatment may be treated with surgery. This information is not intended to replace advice given to you by your health care provider. Make sure you discuss any questions you have with your health care provider. Document Revised: 12/02/2019 Document Reviewed: 12/08/2019 Elsevier Patient Education  2024 [employer]. Document Revised: 07/07/2023 Document Reviewed: 07/07/2023 Elsevier Patient Education  2024 [employer].
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
## 2023-12-13 LAB — CMP14+EGFR
ALT: 11 IU/L (ref 0–32)
AST: 15 IU/L (ref 0–40)
Albumin: 4.2 g/dL (ref 3.8–4.8)
Alkaline Phosphatase: 162 IU/L — ABNORMAL HIGH (ref 44–121)
BUN/Creatinine Ratio: 16 (ref 12–28)
BUN: 38 mg/dL — ABNORMAL HIGH (ref 8–27)
Bilirubin Total: 0.4 mg/dL (ref 0.0–1.2)
CO2: 20 mmol/L (ref 20–29)
Calcium: 9.9 mg/dL (ref 8.7–10.3)
Chloride: 101 mmol/L (ref 96–106)
Creatinine, Ser: 2.42 mg/dL — ABNORMAL HIGH (ref 0.57–1.00)
Globulin, Total: 2.6 g/dL (ref 1.5–4.5)
Glucose: 94 mg/dL (ref 70–99)
Potassium: 4.6 mmol/L (ref 3.5–5.2)
Sodium: 139 mmol/L (ref 134–144)
Total Protein: 6.8 g/dL (ref 6.0–8.5)
eGFR: 20 mL/min/{1.73_m2} — ABNORMAL LOW (ref >59)

## 2023-12-15 ENCOUNTER — Other Ambulatory Visit: Payer: Self-pay | Admitting: Family

## 2024-01-16 ENCOUNTER — Other Ambulatory Visit

## 2024-01-16 DIAGNOSIS — E785 Hyperlipidemia, unspecified: Secondary | ICD-10-CM

## 2024-01-16 DIAGNOSIS — I1A0 Resistant hypertension: Secondary | ICD-10-CM

## 2024-01-16 LAB — LIPID PANEL

## 2024-01-17 LAB — LIPID PANEL
Chol/HDL Ratio: 4.6 ratio — ABNORMAL HIGH (ref 0.0–4.4)
Cholesterol, Total: 198 mg/dL (ref 100–199)
HDL: 43 mg/dL (ref >39)
LDL Chol Calc (NIH): 134 mg/dL — ABNORMAL HIGH (ref 0–99)
Triglycerides: 118 mg/dL (ref 0–149)
VLDL Cholesterol Cal: 21 mg/dL (ref 5–40)

## 2024-01-17 LAB — CBC WITH DIFFERENTIAL/PLATELET
Basophils Absolute: 0 10*3/uL (ref 0.0–0.2)
Basos: 0 %
EOS (ABSOLUTE): 0.2 10*3/uL (ref 0.0–0.4)
Eos: 3 %
Hematocrit: 38.8 % (ref 34.0–46.6)
Hemoglobin: 12.4 g/dL (ref 11.1–15.9)
Immature Grans (Abs): 0 10*3/uL (ref 0.0–0.1)
Immature Granulocytes: 0 %
Lymphocytes Absolute: 1.4 10*3/uL (ref 0.7–3.1)
Lymphs: 27 %
MCH: 29 pg (ref 26.6–33.0)
MCHC: 32 g/dL (ref 31.5–35.7)
MCV: 91 fL (ref 79–97)
Monocytes Absolute: 0.5 10*3/uL (ref 0.1–0.9)
Monocytes: 10 %
Neutrophils Absolute: 3.1 10*3/uL (ref 1.4–7.0)
Neutrophils: 60 %
Platelets: 200 10*3/uL (ref 150–450)
RBC: 4.28 x10E6/uL (ref 3.77–5.28)
RDW: 13.4 % (ref 11.7–15.4)
WBC: 5.2 10*3/uL (ref 3.4–10.8)

## 2024-01-19 ENCOUNTER — Ambulatory Visit: Payer: Self-pay | Admitting: Family

## 2024-01-22 ENCOUNTER — Other Ambulatory Visit

## 2024-01-30 ENCOUNTER — Ambulatory Visit (INDEPENDENT_AMBULATORY_CARE_PROVIDER_SITE_OTHER): Admitting: Family

## 2024-01-30 ENCOUNTER — Telehealth: Payer: Self-pay

## 2024-01-30 ENCOUNTER — Encounter: Payer: Self-pay | Admitting: Family

## 2024-01-30 VITALS — BP 114/87 | HR 78 | Temp 97.0°F | Ht 64.0 in | Wt 120.0 lb

## 2024-01-30 DIAGNOSIS — Z8673 Personal history of transient ischemic attack (TIA), and cerebral infarction without residual deficits: Secondary | ICD-10-CM | POA: Diagnosis not present

## 2024-01-30 DIAGNOSIS — I771 Stricture of artery: Secondary | ICD-10-CM

## 2024-01-30 DIAGNOSIS — E785 Hyperlipidemia, unspecified: Secondary | ICD-10-CM

## 2024-01-30 DIAGNOSIS — I5030 Unspecified diastolic (congestive) heart failure: Secondary | ICD-10-CM

## 2024-01-30 DIAGNOSIS — N184 Chronic kidney disease, stage 4 (severe): Secondary | ICD-10-CM

## 2024-01-30 DIAGNOSIS — I1A0 Resistant hypertension: Secondary | ICD-10-CM | POA: Diagnosis not present

## 2024-01-30 MED ORDER — BLOOD PRESSURE DIGITAL ADD-ON KIT: 1.0000 | PACK | Freq: Two times a day (BID) | 0 refills | Status: AC

## 2024-01-30 MED ORDER — ASPIRIN 81 MG PO TBEC: 81.0000 mg | DELAYED_RELEASE_TABLET | Freq: Every day | ORAL | Status: AC

## 2024-01-30 NOTE — Patient Instructions (Signed)
 Hypertension, Adult High blood pressure (hypertension) is when the force of blood pumping through the arteries is too strong. The arteries are the blood vessels that carry blood from the heart throughout the body. Hypertension forces the heart to work harder to pump blood and may cause arteries to become narrow or stiff. Untreated or uncontrolled hypertension can lead to a heart attack, heart failure, a stroke, kidney disease, and other problems. A blood pressure reading consists of a higher number over a lower number. Ideally, your blood pressure should be below 120/80. The first ("top") number is called the systolic pressure. It is a measure of the pressure in your arteries as your heart beats. The second ("bottom") number is called the diastolic pressure. It is a measure of the pressure in your arteries as the heart relaxes. What are the causes? The exact cause of this condition is not known. There are some conditions that result in high blood pressure. What increases the risk? Certain factors may make you more likely to develop high blood pressure. Some of these risk factors are under your control, including: Smoking. Not getting enough exercise or physical activity. Being overweight. Having too much fat, sugar, calories, or salt (sodium) in your diet. Drinking too much alcohol. Other risk factors include: Having a personal history of heart disease, diabetes, high cholesterol, or kidney disease. Stress. Having a family history of high blood pressure and high cholesterol. Having obstructive sleep apnea. Age. The risk increases with age. What are the signs or symptoms? High blood pressure may not cause symptoms. Very high blood pressure (hypertensive crisis) may cause: Headache. Fast or irregular heartbeats (palpitations). Shortness of breath. Nosebleed. Nausea and vomiting. Vision changes. Severe chest pain, dizziness, and seizures. How is this diagnosed? This condition is diagnosed by  measuring your blood pressure while you are seated, with your arm resting on a flat surface, your legs uncrossed, and your feet flat on the floor. The cuff of the blood pressure monitor will be placed directly against the skin of your upper arm at the level of your heart. Blood pressure should be measured at least twice using the same arm. Certain conditions can cause a difference in blood pressure between your right and left arms. If you have a high blood pressure reading during one visit or you have normal blood pressure with other risk factors, you may be asked to: Return on a different day to have your blood pressure checked again. Monitor your blood pressure at home for 1 week or longer. If you are diagnosed with hypertension, you may have other blood or imaging tests to help your health care provider understand your overall risk for other conditions. How is this treated? This condition is treated by making healthy lifestyle changes, such as eating healthy foods, exercising more, and reducing your alcohol intake. You may be referred for counseling on a healthy diet and physical activity. Your health care provider may prescribe medicine if lifestyle changes are not enough to get your blood pressure under control and if: Your systolic blood pressure is above 130. Your diastolic blood pressure is above 80. Your personal target blood pressure may vary depending on your medical conditions, your age, and other factors. Follow these instructions at home: Eating and drinking  Eat a diet that is high in fiber and potassium, and low in sodium, added sugar, and fat. An example of this eating plan is called the DASH diet. DASH stands for Dietary Approaches to Stop Hypertension. To eat this way: Eat  plenty of fresh fruits and vegetables. Try to fill one half of your plate at each meal with fruits and vegetables. Eat whole grains, such as whole-wheat pasta, brown rice, or whole-grain bread. Fill about one  fourth of your plate with whole grains. Eat or drink low-fat dairy products, such as skim milk or low-fat yogurt. Avoid fatty cuts of meat, processed or cured meats, and poultry with skin. Fill about one fourth of your plate with lean proteins, such as fish, chicken without skin, beans, eggs, or tofu. Avoid pre-made and processed foods. These tend to be higher in sodium, added sugar, and fat. Reduce your daily sodium intake. Many people with hypertension should eat less than 1,500 mg of sodium a day. Do not drink alcohol if: Your health care provider tells you not to drink. You are pregnant, may be pregnant, or are planning to become pregnant. If you drink alcohol: Limit how much you have to: 0-1 drink a day for women. 0-2 drinks a day for men. Know how much alcohol is in your drink. In the [place] one drink equals one 12 oz bottle of beer (355 mL), one 5 oz glass of wine (148 mL), or one 1 oz glass of hard liquor (44 mL). Lifestyle  Work with your health care provider to maintain a healthy body weight or to lose weight. Ask what an ideal weight is for you. Get at least 30 minutes of exercise that causes your heart to beat faster (aerobic exercise) most days of the week. Activities may include walking, swimming, or biking. Include exercise to strengthen your muscles (resistance exercise), such as Pilates or lifting weights, as part of your weekly exercise routine. Try to do these types of exercises for 30 minutes at least 3 days a week. Do not use any products that contain nicotine or tobacco. These products include cigarettes, chewing tobacco, and vaping devices, such as e-cigarettes. If you need help quitting, ask your health care provider. Monitor your blood pressure at home as told by your health care provider. Keep all follow-up visits. This is important. Medicines Take over-the-counter and prescription medicines only as told by your health care provider. Follow directions carefully. Blood  pressure medicines must be taken as prescribed. Do not skip doses of blood pressure medicine. Doing this puts you at risk for problems and can make the medicine less effective. Ask your health care provider about side effects or reactions to medicines that you should watch for. Contact a health care provider if you: Think you are having a reaction to a medicine you are taking. Have headaches that keep coming back (recurring). Feel dizzy. Have swelling in your ankles. Have trouble with your vision. Get help right away if you: Develop a severe headache or confusion. Have unusual weakness or numbness. Feel faint. Have severe pain in your chest or abdomen. Vomit repeatedly. Have trouble breathing. These symptoms may be an emergency. Get help right away. Call 911. Do not wait to see if the symptoms will go away. Do not drive yourself to the hospital. Summary Hypertension is when the force of blood pumping through your arteries is too strong. If this condition is not controlled, it may put you at risk for serious complications. Your personal target blood pressure may vary depending on your medical conditions, your age, and other factors. For most people, a normal blood pressure is less than 120/80. Hypertension is treated with lifestyle changes, medicines, or a combination of both. Lifestyle changes include losing weight, eating a healthy,  low-sodium diet, exercising more, and limiting alcohol. This information is not intended to replace advice given to you by your health care provider. Make sure you discuss any questions you have with your health care provider. Document Revised: 06/26/2021 Document Reviewed: 06/26/2021 Elsevier Patient Education  2024 [employer].

## 2024-01-30 NOTE — Progress Notes (Signed)
 Subjective:    Patient ID: Andrea Ross, female    DOB: 10-28-1945, 78 y.o.   MRN: [id number]  Chief Complaint  Patient presents with   Follow-up    Kidney doctor changed meds yesterday she has not picked up yet. She states she is a little dizzy because she has not ate anything this morning    Pt presents to the office today for chronic follow up and follow up on HTN. Her BP is very elevated today.  She reports she has not taken her medications.   She saw HTN clinic, but has not had to go back. She is followed by Cardiologists for CHF.    She has hx of CVA.    She has CKD and is followed by nephrologists. Avoids NSAID's, this is stable. Saw him yesterday and increased her clonidine  to 0.2 mg BID from 0.1 mg.   She has osteopenia and her last dexa scan was 11/29/21. She takes calcium  and vit D daily.    Followed by vascular for subclavian stenosis, and had stent placed.  Hypertension This is a chronic problem. The current episode started more than 1 year ago. The problem has been waxing and waning since onset. The problem is uncontrolled. Pertinent negatives include no headaches, malaise/fatigue, peripheral edema or shortness of breath. (Dizzy ) Risk factors for coronary artery disease include dyslipidemia and sedentary lifestyle. The current treatment provides moderate improvement.  Hyperlipidemia This is a chronic problem. The current episode started more than 1 year ago. The problem is uncontrolled. Recent lipid tests were reviewed and are high. Factors aggravating her hyperlipidemia include fatty foods. Pertinent negatives include no shortness of breath. Current antihyperlipidemic treatment includes statins. The current treatment provides moderate improvement of lipids. Risk factors for coronary artery disease include dyslipidemia, hypertension, a sedentary lifestyle and post-menopausal.  Congestive Heart Failure Presents for follow-up visit. Pertinent negatives include no edema,  fatigue or shortness of breath. The symptoms have been stable.   Current opioids rx- Ultram  50 mg  # meds rx- 30 Effectiveness of current meds- Stable Adverse reactions from pain meds-none Morphine  equivalent- 20   Pill count performed-No Last drug screen - 09/05/21 ( high risk q71m, moderate risk q10m, low risk yearly ) Urine drug screen today- Yes Was the [facility] reviewed- yes             If yes were their any concerning findings? - none     Pain contract signed on:09/05/21   Review of Systems  Constitutional:  Negative for fatigue and malaise/fatigue.  Respiratory:  Negative for shortness of breath.   Neurological:  Negative for headaches.  All other systems reviewed and are negative.      Objective:   Physical Exam Vitals reviewed.  Constitutional:      General: She is not in acute distress.    Appearance: She is well-developed.  HENT:     Head: Normocephalic and atraumatic.     Right Ear: Tympanic membrane normal.     Left Ear: Tympanic membrane normal.  Eyes:     Pupils: Pupils are equal, round, and reactive to light.  Neck:     Thyroid: No thyromegaly.  Cardiovascular:     Rate and Rhythm: Normal rate and regular rhythm.     Heart sounds: Normal heart sounds. No murmur heard. Pulmonary:     Effort: Pulmonary effort is normal. No respiratory distress.     Breath sounds: Normal breath sounds. No wheezing.  Abdominal:  General: Bowel sounds are normal. There is no distension.     Palpations: Abdomen is soft.     Tenderness: There is no abdominal tenderness.  Musculoskeletal:        General: No tenderness.     Cervical back: Normal range of motion and neck supple.     Comments: Back brace on, unable to flex thoracic area  Skin:    General: Skin is warm and dry.  Neurological:     Mental Status: She is alert and oriented to person, place, and time.     Cranial Nerves: No cranial nerve deficit.     Deep Tendon Reflexes: Reflexes are normal and symmetric.   Psychiatric:        Behavior: Behavior normal.        Thought Content: Thought content normal.        Judgment: Judgment normal.       BP (!) 183/79   Pulse 78   Temp (!) 97 F (36.1 C) (Temporal)   Ht 5\' 4"  (1.626 m)   Wt 120 lb (54.4 kg)   SpO2 97%   BMI 20.60 kg/m      Assessment & Plan:  Andrea Ross comes in today with chief complaint of Follow-up (Kidney doctor changed meds yesterday she has not picked up yet. She states she is a little dizzy because she has not ate anything this morning )   Diagnosis and orders addressed:  1. Resistant hypertension (Primary) - Blood Pressure Monitoring (BLOOD PRESSURE DIGITAL ADD-ON) KIT; 1 Device by Does not apply route 2 (two) times daily.  Dispense: 1 kit; Refill: 0  2. Hyperlipidemia, unspecified hyperlipidemia type  3. History of cardioembolic cerebrovascular accident (CVA)  4. Chronic kidney disease (CKD), stage IV (severe) (HCC)  5. Subclavian arterial stenosis (HCC)  6. Diastolic congestive heart failure, NYHA class 1, unspecified congestive heart failure chronicity (HCC)    Labs reviewed, had labs drawn from nephrologists yesterday.  BP improved on recheck slightly. Will go straight home and take all medications and rest for 1 hour and recheck. She will call and let us  know her home BP.  Continue current meds- low fat diet and exercise and recheck in 3 months Health Maintenance reviewed Diet and exercise encouraged  Follow up plan: 3 months    Tommas Fragmin, FNP

## 2024-01-30 NOTE — Telephone Encounter (Signed)
 Copied from CRM [id number]. Topic: General - Other >> Jan 30, 2024  3:05 PM Crispin Dolphin wrote: Reason for CRM: Patient called. States provider to her to call back with Bp reading. It was 114/87 pulse 60. Thank You

## 2024-02-17 ENCOUNTER — Encounter: Payer: Self-pay | Admitting: Nurse Practitioner

## 2024-02-17 ENCOUNTER — Ambulatory Visit: Payer: Commercial Managed Care - PPO | Attending: Nurse Practitioner | Admitting: Nurse Practitioner

## 2024-02-17 VITALS — BP 170/77 | HR 64 | Ht 64.0 in | Wt 117.6 lb

## 2024-02-17 DIAGNOSIS — I701 Atherosclerosis of renal artery: Secondary | ICD-10-CM

## 2024-02-17 DIAGNOSIS — I739 Peripheral vascular disease, unspecified: Secondary | ICD-10-CM | POA: Diagnosis not present

## 2024-02-17 DIAGNOSIS — E785 Hyperlipidemia, unspecified: Secondary | ICD-10-CM

## 2024-02-17 DIAGNOSIS — I6523 Occlusion and stenosis of bilateral carotid arteries: Secondary | ICD-10-CM | POA: Diagnosis not present

## 2024-02-17 DIAGNOSIS — I1A0 Resistant hypertension: Secondary | ICD-10-CM

## 2024-02-17 DIAGNOSIS — I517 Cardiomegaly: Secondary | ICD-10-CM

## 2024-02-17 DIAGNOSIS — N184 Chronic kidney disease, stage 4 (severe): Secondary | ICD-10-CM

## 2024-02-17 DIAGNOSIS — I340 Nonrheumatic mitral (valve) insufficiency: Secondary | ICD-10-CM

## 2024-02-17 DIAGNOSIS — I10 Essential (primary) hypertension: Secondary | ICD-10-CM

## 2024-02-17 DIAGNOSIS — Z8673 Personal history of transient ischemic attack (TIA), and cerebral infarction without residual deficits: Secondary | ICD-10-CM

## 2024-02-17 DIAGNOSIS — I5030 Unspecified diastolic (congestive) heart failure: Secondary | ICD-10-CM

## 2024-02-17 MED ORDER — HYDRALAZINE HCL 25 MG PO TABS: 25.0000 mg | ORAL_TABLET | Freq: Two times a day (BID) | ORAL | 1 refills | Status: DC

## 2024-02-17 NOTE — Patient Instructions (Addendum)
 Medication Instructions:  Your physician has recommended you make the following change in your medication:  Please start Hydralazine  25 Mg twice daily   Labwork: Non   Testing/Procedures: Your physician has requested that you have an echocardiogram. Echocardiography is a painless test that uses sound waves to create images of your heart. It provides your doctor with information about the size and shape of your heart and how well your heart's chambers and valves are working. This procedure takes approximately one hour. There are no restrictions for this procedure. Please do NOT wear cologne, perfume, aftershave, or lotions (deodorant is allowed). Please arrive 15 minutes prior to your appointment time.  Please note: We ask at that you not bring children with you during ultrasound (echo/ vascular) testing. Due to room size and safety concerns, children are not allowed in the ultrasound rooms during exams. Our front office staff cannot provide observation of children in our lobby area while testing is being conducted. An adult accompanying a patient to their appointment will only be allowed in the ultrasound room at the discretion of the ultrasound technician under special circumstances. We apologize for any inconvenience.  Follow-Up: Your physician recommends that you schedule a follow-up appointment in:  3-4 weeks for a nurse visit Blood pressure check  3-4 months   Any Other Special Instructions Will Be Listed Below (If Applicable).  If you need a refill on your cardiac medications before your next appointment, please call your pharmacy.   (Patient Name)-_____________________________  (MRN)-_________________________     (Physician)-_________________________________   (DATE) -_________ (Blood Pressure)-_________  (Heart Rate)-_________    (DATE) -_________ (Blood Pressure)-_________  (Heart Rate)-_________    (DATE) -_________ (Blood Pressure)-_________  (Heart  Rate)-_________   (DATE) -_________ (Blood Pressure)-_________  (Heart Rate)-_________    (DATE) -_________ (Blood Pressure)-_________  (Heart Rate)-_________    (DATE) -_________ (Blood Pressure)-_________  (Heart Rate)-_________   (DATE) -_________ (Blood Pressure)-_________  (Heart Rate)-_________    (DATE) -_________ (Blood Pressure)-_________  (Heart Rate)-_________    (DATE) -_________ (Blood Pressure)-_________  (Heart Rate)-_________    (DATE) -_________ (Blood Pressure)-_________  (Heart Rate)-_________    (DATE) -_________ (Blood Pressure)-_________  (Heart Rate)-_________    (DATE) -_________ (Blood Pressure)-_________  (Heart Rate)-_________    (DATE) -_________ (Blood Pressure)-_________  (Heart Rate)-_________    (DATE) -_________ (Blood Pressure)-_________  (Heart Rate)-_________

## 2024-02-17 NOTE — Progress Notes (Unsigned)
 Cardiology Office Note:  .   Date:  02/17/2024 ID:  Andrea Ross, DOB [birth year], MRN [id number] PCP: Yevette Hem, FNP  [facility] Providers Cardiologist:  Armida Lander, MD    History of Present Illness: Andrea Ross   Andrea Ross is a 78 y.o. female with a PMH of resistant hypertension, carotid artery stenosis, bilateral renal artery stenosis, subclavian stenosis, hyperlipidemia, history of CVA, and CKD (Dr. Carrolyn Clan), who presents today for 38-month follow-up.  Last seen by Dr. Armida Lander on June 05, 2023.  It was noted that her blood pressures were elevated in the clinic, at home blood pressures were often at goal, but sometimes were elevated.  She was instructed to stop taking chlorthalidone  due to GFR of less than 30, was started on Lasix  40 mg daily.  Atorvastatin  was increased to 80 mg daily.  09/23/2023 - Today she presents for 20-month follow-up appointment.  She states she is overall doing well, however admits to left groin pain that has been ongoing for the past 2 years and pain is noticed with movement particularly when turning in the bed. Denies any chest pain, shortness of breath, palpitations, syncope, presyncope, dizziness, orthopnea, PND, swelling or significant weight changes, acute bleeding, or claudication.   02/17/2024 - Here for follow-up. Doing well per her report. BP is poorly controlled. Says SBP averaging 150-165 per her report. Reports labile readings. Denies any chest pain, shortness of breath, palpitations, syncope, presyncope, dizziness, orthopnea, PND, swelling or significant weight changes, acute bleeding, or claudication.  ROS: Negative. See HPI.   Studies Reviewed: Andrea Ross     EKG: EKG Interpretation Date/Time:  Tuesday February 17 2024 11:02:22 EDT Ventricular Rate:  64 PR Interval:  154 QRS Duration:  86 QT Interval:  408 QTC Calculation: 420 R Axis:   37  Text Interpretation: Sinus rhythm with marked sinus arrhythmia Minimal voltage criteria for  LVH, may be normal variant ( Sokolow-Lyon ) ST & T wave abnormality, consider inferior ischemia ST & T wave abnormality, consider anterolateral ischemia When compared with ECG of 02-Jul-2023 10:58, Inverted T waves have replaced nonspecific T wave abnormality in Inferior leads T wave inversion more evident in Anterolateral leads Confirmed by Lasalle Pointer [phone]) on 02/17/2024 11:07:30 AM   Renal artery duplex 11/2023:  Evidence of a > 60% stenosis of the right renal artery.         Abnormal size for the right kidney. RRV flow present.  Left:  Evidence of a > 60% stenosis in the left renal artery. Normal         size of left kidney. LRV flow present.    *See table(s) above for measurements and observations.    Diagnosing physician: Angela Kell MD  Carotid duplex 05/2023:  Summary:  Right Carotid: Velocities in the right ICA are consistent with a 1-39%  stenosis.   Left Carotid: Velocities in the left ICA are consistent with a 1-39%  stenosis.   Vertebrals: Bilateral vertebral arteries demonstrate antegrade flow.  Subclavians: Left subclavian artery was stenotic. Right subclavian artery flow was disturbed.   *See table(s) above for measurements and observations.  Suggest follow up study in 12 months.    Renal artery duplex 02/2023:  Summary:  Renal:    Right: Abnormal size for the right kidney. RRV flow present. 1-59%         stenosis of the right renal artery.  Left:  Abnormal size for the left kidney. Evidence of a > 60%  stenosis in the left renal artery. LRV flow present.         Suboptimal visualization of the left kidney. Further imaging         may be warranted.    *See table(s) above for measurements and observations.  Echo 06/2022:  1. Left ventricular ejection fraction, by estimation, is 60 to 65%. The  left ventricle has normal function. The left ventricle has no regional  wall motion abnormalities. There is moderate concentric left ventricular hypertrophy.  Left ventricular diastolic parameters are consistent with Grade I diastolic dysfunction (impaired relaxation).   2. Right ventricular systolic function is normal. The right ventricular  size is normal. There is normal pulmonary artery systolic pressure. The estimated right ventricular systolic pressure is 18.7 mmHg.   3. The mitral valve is grossly normal. Mild mitral valve regurgitation.   4. The aortic valve is tricuspid. Aortic valve regurgitation is mild.  Aortic valve sclerosis/calcification is present, without any evidence of  aortic stenosis. Aortic regurgitation PHT measures 622 msec. Aortic valve mean gradient measures 5.0 mmHg.   5. The inferior vena cava is normal in size with greater than 50%  respiratory variability, suggesting right atrial pressure of 3 mmHg.   Comparison(s): Prior images unable to be directly viewed.  Vascular ultrasound lower extremity arterial bilateral 05/2022:  Summary:  Right: Total occlusion noted in the superficial femoral artery and/or  popliteal artery. 30-49% stenosis noted in the anterior tibial artery.  30-49% stenosis noted in the posterior tibial artery.   Left: 75-99% stenosis noted in the common femoral artery. Total occlusion  noted in the superficial femoral artery and/or popliteal artery. 30-49%  stenosis noted in the popliteal artery. 30-49% stenosis noted in the  posterior tibial artery.  ABI's 05/2022:  Findings reported to [facility] notified over BP over at 10:30 am. Patient instructed to go home and call Kristin Alvstad Monday morning..  Summary:  Right: The right toe-brachial index is abnormal.  Although ankle brachial indices are within normal limits (0.95-1.29),  arterial Doppler waveforms at the ankle suggest some component of arterial  occlusive disease.  Left: Resting left ankle-brachial index indicates moderate left lower  extremity arterial disease. The left toe-brachial index is abnormal.  Physical Exam:    VS:  BP (!) 170/77   Pulse 64   Ht 5' 4 (1.626 m)   Wt 117 lb 9.6 oz (53.3 kg)   SpO2 100%   BMI 20.19 kg/m    Wt Readings from Last 3 Encounters:  02/17/24 117 lb 9.6 oz (53.3 kg)  01/30/24 120 lb (54.4 kg)  12/12/23 120 lb 9.6 oz (54.7 kg)    GEN: Well nourished, well developed in no acute distress NECK: No JVD; No carotid bruits CARDIAC: S1/S2, RRR, no murmurs, rubs, gallops RESPIRATORY:  Clear to auscultation without rales, wheezing or rhonchi  ABDOMEN: Soft, non-tender, non-distended EXTREMITIES:  No edema; No deformity   ASSESSMENT AND PLAN: .    Resistant HTN BP not at goal. Discussed to monitor BP at home at least 2 hours after medications and sitting for 5-10 minutes. Consulted clinical pharm D and will begin hydralazine  25 mg BID, continue rest of medication regimen.  Heart healthy diet and regular cardiovascular exercise encouraged.   *** Carotid artery stenosis, bilateral renal artery stenosis Carotid duplex 05/2023 revealed mild 1-39% bilateral carotid artery stenosis with stenosis of left subclavian artery. Renal artery duplex 02/2023 revealed 1-59% stenosis of right renal artery and > 60% stenosis of left renal  artery. LDL 12/2022 was 94 and not at goal. Atorvastatin  increased at last office visit. Will obtain FLP/LFT per protocol. Denies any symptoms. No medication changes at this time. Heart healthy diet and regular cardiovascular exercise encouraged.   PAD, HLD, medication management In addition to vascular studies above, vascular ultrasound lower extremity arterial duplex 05/2022 revealed total occlusion noted in right superficial femoral artery and/or popliteal artery, 30 to 49% stenosis noted in the right anterior tibial artery and 30 to 49% stenosis noted in the posterior tibial artery.  On the left side, she was noted to have 70-99% stenosis along the common femoral artery with total occlusion noted in the left superficial femoral artery and/or popliteal artery, 30  to 49% stenosis noted in the popliteal artery, 30 to 49% stenosis in the left posterior tibial artery.  She admits to chronic, stable left groin pain.  She has upcoming appointment with [facility].  Obtaining labs as mentioned above.  No medication changes at this time.  Care and ED precautions discussed.  Hx of CVA Denies any issues.  Continue current medication regimen.  Continue follow-up with PCP.  Care and ED precautions discussed.  CKD stage IV Most recent sCr was 2.35 with eGFR at 21.  Avoid nephrotoxic agents.  Continue follow-up with Dr. Carrolyn Clan.  No medication changes at this time.   F/u 3-4 months.      Echocardiogram - ultrasound. Mitral valve regurg LVH.    Let me consult clinical pharmacist.  - Larinda Plover amazing.   Dispo: Follow-up with Dr. Armida Lander or APP in 4 to 6 months or sooner if anything changes.  Signed, Lasalle Pointer, NP

## 2024-02-18 ENCOUNTER — Other Ambulatory Visit: Payer: Self-pay | Admitting: Cardiology

## 2024-02-25 ENCOUNTER — Encounter: Attending: Cardiology | Admitting: *Deleted

## 2024-02-25 VITALS — BP 234/86 | HR 75 | Temp 97.6°F

## 2024-02-25 DIAGNOSIS — E785 Hyperlipidemia, unspecified: Secondary | ICD-10-CM

## 2024-02-25 MED ORDER — INCLISIRAN SODIUM 284 MG/1.5ML ~~LOC~~ SOSY
284.0000 mg | PREFILLED_SYRINGE | Freq: Once | SUBCUTANEOUS | Status: AC
  Administered 2024-02-25: 284 mg via SUBCUTANEOUS

## 2024-02-25 NOTE — Progress Notes (Signed)
 Diagnosis: Hyperlipidemia  Provider:  Tamra Ross, MD  Procedure: Injection  Leqvio (inclisiran), Dose: 284 mg, Site: subcutaneous, Number of injections: 1  Injection Site(s): Left arm  Post Care: Observation period completed  Discharge: Condition: Good, Destination: Home . AVS Provided  Performed by:  Baldwin Darice Helling, RN

## 2024-02-25 NOTE — Progress Notes (Signed)
 Called patient to discuss her high blood pressure. She stated that her BP always gets high whenever she moves around. Patient stated that she had taken all her meds this morning. I suggested per Dr. Eartha that she go to the ER or her PCP. She stated that she would call her PCP but her PCP was aware of her having high BP all the time.

## 2024-03-02 ENCOUNTER — Telehealth: Payer: Self-pay | Admitting: Cardiology

## 2024-03-02 NOTE — Telephone Encounter (Signed)
 Left message to call back

## 2024-03-02 NOTE — Telephone Encounter (Signed)
 Pt c/o medication issue:  1. Name of Medication:  furosemide  (LASIX ) 20 MG tablet    2. How are you currently taking this medication (dosage and times per day)?   3. Are you having a reaction (difficulty breathing--STAT)?   4. What is your medication issue?   Patient says Furosemide  makes her mouth very dry and she would like a call back to discuss.

## 2024-03-03 NOTE — Telephone Encounter (Signed)
 Spoke with patient regarding Furosemide . She stated that her mouth and throat are very dry when she takes the Furosemide . Also stated that she gets dizzy only when taking this medications. She reports no swelling, SOB or chest pains. She state that she drinks 2-3 bottles of water daily along with milk and juice. She reported she does drink more fluids when she has these dry mouth spells. It goes away and then comes back. She didn't take the medication yesterday and hasn't had it this morning as of yet. And she doesn't have any symptoms of the dry throat. She stated that she is taking on 1 tablet daily and is 20 mg. Questioned was she taking 40 mg as it states in her chart and she say its on 20 mg. Advised her will send to Dr. Alvan for any further recommendations.

## 2024-03-09 ENCOUNTER — Ambulatory Visit (INDEPENDENT_AMBULATORY_CARE_PROVIDER_SITE_OTHER)

## 2024-03-09 ENCOUNTER — Other Ambulatory Visit: Payer: Self-pay | Admitting: Cardiology

## 2024-03-09 ENCOUNTER — Ambulatory Visit: Attending: Cardiology | Admitting: *Deleted

## 2024-03-09 VITALS — BP 89/59 | HR 44

## 2024-03-09 DIAGNOSIS — I340 Nonrheumatic mitral (valve) insufficiency: Secondary | ICD-10-CM | POA: Diagnosis not present

## 2024-03-09 DIAGNOSIS — I517 Cardiomegaly: Secondary | ICD-10-CM

## 2024-03-09 DIAGNOSIS — R001 Bradycardia, unspecified: Secondary | ICD-10-CM | POA: Diagnosis not present

## 2024-03-09 MED ORDER — HYDRALAZINE HCL 25 MG PO TABS: 12.5000 mg | ORAL_TABLET | Freq: Two times a day (BID) | ORAL | Status: DC

## 2024-03-09 MED ORDER — LABETALOL HCL 100 MG PO TABS: 100.0000 mg | ORAL_TABLET | Freq: Every day | ORAL | 6 refills | Status: DC

## 2024-03-09 NOTE — Progress Notes (Signed)
 Patient in office for nurse BP check.    BP 89/59  44  Patient took her morning medications around 7:15 am.   Last seen by Almarie Crate, NP on 02/17/24 & started on Hydralazine  25mg  twice a day.  States since beginning this medication she has felt lightheaded after medications, wants to sleep a lot and mouth & throat are extremely dry (no swelling of throat or tongue).    She is currently getting Echo in office now.    After Echo was completed, Tonya (sonographer) checked BP's. BP lying - 88/60 BP lying, raised head up & got dizzy - 142/80 Sitting up after 3-5 minutes - 118/80 Tonya reports lowest HR during testing was 42.   Dr. Alvan made aware of above & he reviewed EKG - per provider -   Decrease Labetalol  to 100mg  daily Decrease Hydralazine  to 12.5mg  twice a day   Return for nurse visit either Thursday or Friday for repeat EKG & vitals

## 2024-03-09 NOTE — Patient Instructions (Addendum)
 Medication Instructions:   Decrease Labetalol  to 100mg  daily Decrease Hydralazine  to 12.5mg  twice a day   Continue all other medications.     Labwork:  none  Testing/Procedures:  none  Follow-Up:  Nurse visit either Thursday or Friday for repeat EKG & vitals   Any Other Special Instructions Will Be Listed Below (If Applicable).   If you need a refill on your cardiac medications before your next appointment, please call your pharmacy.

## 2024-03-10 LAB — ECHOCARDIOGRAM COMPLETE
AR max vel: 1.99 cm2
AV Area VTI: 2.05 cm2
AV Area mean vel: 2 cm2
AV Mean grad: 5 mmHg
AV Peak grad: 9.6 mmHg
AV Vena cont: 0.6 cm
Ao pk vel: 1.55 m/s
Area-P 1/2: 1.59 cm2
Calc EF: 59.7 %
MV VTI: 1.56 cm2
P 1/2 time: 717 ms
S' Lateral: 2.4 cm
Single Plane A2C EF: 60.6 %
Single Plane A4C EF: 62.5 %

## 2024-03-10 NOTE — Telephone Encounter (Signed)
 Per Dr. Alvan:  Can stop lasix    JINNY Alvan MD  Patient informed and verbalized understanding

## 2024-03-11 ENCOUNTER — Encounter: Attending: Cardiology | Admitting: *Deleted

## 2024-03-11 VITALS — BP 260/108 | HR 77

## 2024-03-11 DIAGNOSIS — R001 Bradycardia, unspecified: Secondary | ICD-10-CM | POA: Diagnosis not present

## 2024-03-11 MED ORDER — HYDRALAZINE HCL 25 MG PO TABS: 25.0000 mg | ORAL_TABLET | Freq: Two times a day (BID) | ORAL | Status: DC

## 2024-03-11 NOTE — Progress Notes (Signed)
 Patient in office for nurse BP check & EKG.  BP  260/10   77  99%  BP recheck after 5-10 minutes - 260/108    EKG HR much improved from 44 on 03/09/24 to 74 today.    States she has not taken her morning medications yet as she felt sick this morning & was scared she might throw them back up.    Patient also appears more alert & not as sluggish today compared to a few days ago.   Discussed EKG & vitals with Dr. Alvan - per provider -   Increase Hydralazine  back up to 25mg  twice a day   Continue all other medications the same Keep BP log x 1 week & return log to office for review.   Patient given above instructions - daughter & patient verbalized understanding.

## 2024-03-11 NOTE — Patient Instructions (Addendum)
 Medication Instructions:   Increase Hydralazine  back up to 25mg  twice a day   Continue all other medications.     Labwork:  none  Testing/Procedures:  none  Follow-Up:  06/01/24 with Almarie Crate, NP as scheduled   Any Other Special Instructions Will Be Listed Below (If Applicable).  Log BP readings over the next 7-10 days & return to office for provider review.    If you need a refill on your cardiac medications before your next appointment, please call your pharmacy.

## 2024-03-18 ENCOUNTER — Other Ambulatory Visit

## 2024-03-22 ENCOUNTER — Encounter: Payer: Self-pay | Admitting: Nurse Practitioner

## 2024-04-25 ENCOUNTER — Ambulatory Visit: Payer: Self-pay | Admitting: Nurse Practitioner

## 2024-04-26 NOTE — Telephone Encounter (Signed)
 Patient is returning phone call.

## 2024-05-04 ENCOUNTER — Ambulatory Visit (INDEPENDENT_AMBULATORY_CARE_PROVIDER_SITE_OTHER): Admitting: Family

## 2024-05-04 ENCOUNTER — Encounter: Payer: Self-pay | Admitting: Family

## 2024-05-04 VITALS — BP 130/79 | HR 69 | Temp 97.8°F | Ht 64.0 in | Wt 122.6 lb

## 2024-05-04 DIAGNOSIS — N184 Chronic kidney disease, stage 4 (severe): Secondary | ICD-10-CM

## 2024-05-04 DIAGNOSIS — Z0001 Encounter for general adult medical examination with abnormal findings: Secondary | ICD-10-CM

## 2024-05-04 DIAGNOSIS — Z0000 Encounter for general adult medical examination without abnormal findings: Secondary | ICD-10-CM

## 2024-05-04 DIAGNOSIS — E559 Vitamin D deficiency, unspecified: Secondary | ICD-10-CM | POA: Diagnosis not present

## 2024-05-04 DIAGNOSIS — Z8673 Personal history of transient ischemic attack (TIA), and cerebral infarction without residual deficits: Secondary | ICD-10-CM | POA: Diagnosis not present

## 2024-05-04 DIAGNOSIS — I5030 Unspecified diastolic (congestive) heart failure: Secondary | ICD-10-CM

## 2024-05-04 DIAGNOSIS — I10 Essential (primary) hypertension: Secondary | ICD-10-CM

## 2024-05-04 DIAGNOSIS — M8580 Other specified disorders of bone density and structure, unspecified site: Secondary | ICD-10-CM

## 2024-05-04 DIAGNOSIS — E785 Hyperlipidemia, unspecified: Secondary | ICD-10-CM | POA: Diagnosis not present

## 2024-05-04 LAB — LIPID PANEL

## 2024-05-04 NOTE — Progress Notes (Signed)
 Subjective:    Patient ID: Andrea Ross, female    DOB: 1946/07/10, 78 y.o.   MRN: [id number]  Chief Complaint  Patient presents with   Medical Management of Chronic Issues   Pt presents to the office today for CPE and chronic follow up.   She saw HTN clinic, but has not had to go back. She is followed by Cardiologists for CHF. Getting Inclisiran injection every 6 months.    She has hx of CVA.    She has CKD and is followed by nephrologists. Avoids NSAID's, this is stable.    She has osteopenia and her last dexa scan was 11/29/21. She takes calcium  and vit D daily.   Hypertension This is a chronic problem. The current episode started more than 1 year ago. The problem has been resolved since onset. The problem is controlled. Pertinent negatives include no malaise/fatigue, peripheral edema or shortness of breath. Risk factors for coronary artery disease include sedentary lifestyle. The current treatment provides moderate improvement. Hypertensive end-organ damage includes CVA and heart failure.  Congestive Heart Failure Presents for follow-up visit. Pertinent negatives include no edema, fatigue or shortness of breath. The symptoms have been stable.  Back Pain This is a chronic problem. The current episode started more than 1 year ago. The problem occurs intermittently. The problem has been waxing and waning since onset. The pain is present in the lumbar spine. The quality of the pain is described as aching. The pain is at a severity of 0/10. The patient is experiencing no pain.  Hyperlipidemia This is a chronic problem. The current episode started more than 1 year ago. The problem is uncontrolled. Recent lipid tests were reviewed and are high. Pertinent negatives include no shortness of breath. Current antihyperlipidemic treatment includes statins (inclisiran). The current treatment provides moderate improvement of lipids. Risk factors for coronary artery disease include dyslipidemia,  hypertension and a sedentary lifestyle.      Review of Systems  Constitutional:  Negative for fatigue and malaise/fatigue.  Respiratory:  Negative for shortness of breath.   Musculoskeletal:  Positive for back pain.  All other systems reviewed and are negative.  Family History  Problem Relation Age of Onset   Hypertension Mother    Heart failure Mother    Diabetes Mother    Heart disease Mother    Hypertension Father    Cancer Father    Kidney disease Brother    Hypertension Brother    Stroke Brother    Social History   Socioeconomic History   Marital status: Widowed    Spouse name: Not on file   Number of children: 2   Years of education: Not on file   Highest education level: Not on file  Occupational History   Not on file  Tobacco Use   Smoking status: Former    Current packs/day: 0.00    Average packs/day: 0.3 packs/day for 3.2 years (0.8 ttl pk-yrs)    Types: Cigarettes    Start date: 07/03/1970    Quit date: 09/02/1973    Years since quitting: 50.7    Passive exposure: Never   Smokeless tobacco: Never  Vaping Use   Vaping status: Never Used  Substance and Sexual Activity   Alcohol use: No    Alcohol/week: 0.0 standard drinks of alcohol   Drug use: No   Sexual activity: Not on file  Other Topics Concern   Not on file  Social History Narrative   Not on file   Social  Drivers of Health   Financial Resource Strain: Low Risk  (04/01/2022)   Overall Financial Resource Strain (CARDIA)    Difficulty of Paying Living Expenses: Not hard at all  Food Insecurity: No Food Insecurity (04/01/2022)   Hunger Vital Sign    Worried About Running Out of Food in the Last Year: Never true    Ran Out of Food in the Last Year: Never true  Transportation Needs: No Transportation Needs (04/01/2022)   PRAPARE - Administrator, Civil Service (Medical): No    Lack of Transportation (Non-Medical): No  Physical Activity: Inactive (04/01/2022)   Exercise Vital Sign     Days of Exercise per Week: 0 days    Minutes of Exercise per Session: 0 min  Stress: Not on file  Social Connections: Not on file       Objective:   Physical Exam Vitals reviewed.  Constitutional:      General: She is not in acute distress.    Appearance: She is well-developed.  HENT:     Head: Normocephalic and atraumatic.     Right Ear: Tympanic membrane normal.     Left Ear: Tympanic membrane normal.  Eyes:     Pupils: Pupils are equal, round, and reactive to light.  Neck:     Thyroid: No thyromegaly.  Cardiovascular:     Rate and Rhythm: Normal rate and regular rhythm.     Heart sounds: Normal heart sounds. No murmur heard. Pulmonary:     Effort: Pulmonary effort is normal. No respiratory distress.     Breath sounds: Normal breath sounds. No wheezing.  Abdominal:     General: Bowel sounds are normal. There is no distension.     Palpations: Abdomen is soft.     Tenderness: There is no abdominal tenderness.  Musculoskeletal:        General: No tenderness. Normal range of motion.     Cervical back: Normal range of motion and neck supple.  Skin:    General: Skin is warm and dry.  Neurological:     Mental Status: She is alert and oriented to person, place, and time.     Cranial Nerves: No cranial nerve deficit.     Deep Tendon Reflexes: Reflexes are normal and symmetric.  Psychiatric:        Behavior: Behavior normal.        Thought Content: Thought content normal.        Judgment: Judgment normal.    BP 130/79   Pulse 69   Temp 97.8 F (36.6 C)   Ht 5' 4 (1.626 m)   Wt 122 lb 9.6 oz (55.6 kg)   SpO2 94%   BMI 21.04 kg/m       Assessment & Plan:  Andrea Ross comes in today with chief complaint of Medical Management of Chronic Issues   Diagnosis and orders addressed:  1. Annual physical exam (Primary) - CMP14+EGFR - CBC with Differential/Platelet - Lipid panel  2. Hyperlipidemia, unspecified hyperlipidemia type - CMP14+EGFR - CBC with  Differential/Platelet - Lipid panel  3. Vitamin D  deficiency - CMP14+EGFR - CBC with Differential/Platelet - VITAMIN D  25 Hydroxy (Vit-D Deficiency, Fractures)  4. History of cardioembolic cerebrovascular accident (CVA)  - CMP14+EGFR - CBC with Differential/Platelet  5. Osteopenia, unspecified location - CMP14+EGFR - CBC with Differential/Platelet  6. Chronic kidney disease (CKD), stage IV (severe) (HCC)  - CMP14+EGFR - CBC with Differential/Platelet  7. Diastolic congestive heart failure, NYHA class 1, unspecified congestive  heart failure chronicity (HCC) - CMP14+EGFR - CBC with Differential/Platelet  8. Benign essential hypertension - CMP14+EGFR - CBC with Differential/Platelet   Labs pending Health Maintenance reviewed- Will do dexa scan on next visit Continue current medications Keep follow up specialists  Diet and exercise encouraged  Follow up plan: 6 months    Bari Learn, FNP

## 2024-05-04 NOTE — Patient Instructions (Signed)
 Dyslipidemia Dyslipidemia is an imbalance of waxy, fat-like substances (lipids) in the blood. The body needs lipids in small amounts. Dyslipidemia often involves a high level of cholesterol or triglycerides, which are types of lipids. Common forms of dyslipidemia include: High levels of LDL cholesterol. LDL is the type of cholesterol that causes fatty deposits (plaques) to build up in the blood vessels that carry blood away from the heart (arteries). Low levels of HDL cholesterol. HDL cholesterol is the type of cholesterol that protects against heart disease. High levels of HDL remove the LDL buildup from arteries. High levels of triglycerides. Triglycerides are a fatty substance in the blood that is linked to a buildup of plaques in the arteries. What are the causes? There are two main types of dyslipidemia: primary and secondary. Primary dyslipidemia is caused by changes (mutations) in genes that are passed down through families (inherited). These mutations cause several types of dyslipidemia. Secondary dyslipidemia may be caused by various risk factors that can lead to the disease, such as lifestyle choices and certain medical conditions. What increases the risk? You are more likely to develop this condition if you are an older man or if you are a woman who has gone through menopause. Other risk factors include: Having a family history of dyslipidemia. Taking certain medicines, including birth control pills, steroids, some diuretics, and beta-blockers. Eating a diet high in saturated fat. Smoking cigarettes or excessive alcohol intake. Having certain medical conditions such as diabetes, polycystic ovary syndrome (PCOS), kidney disease, liver disease, or hypothyroidism. Not exercising regularly. Being overweight or obese with too much belly fat. What are the signs or symptoms? In most cases, dyslipidemia does not usually cause any symptoms. In severe cases, very high lipid levels can  cause: Fatty bumps under the skin (xanthomas). A white or gray ring around the black center (pupil) of the eye. Very high triglyceride levels can cause inflammation of the pancreas (pancreatitis). How is this diagnosed? Your health care provider may diagnose dyslipidemia based on a routine blood test (fasting blood test). Because most people do not have symptoms of the condition, this blood testing (lipid profile) is done on adults age 69 and older and is repeated every 4-6 years. This test checks: Total cholesterol. This measures the total amount of cholesterol in your blood, including LDL cholesterol, HDL cholesterol, and triglycerides. A healthy number is below 200 mg/dL (4.82 mmol/L). LDL cholesterol. The target number for LDL cholesterol is different for each person, depending on individual risk factors. A healthy number is usually below 100 mg/dL (7.40 mmol/L). Ask your health care provider what your LDL cholesterol should be. HDL cholesterol. An HDL level of 60 mg/dL (8.44 mmol/L) or higher is best because it helps to protect against heart disease. A number below 40 mg/dL (8.96 mmol/L) for men or below 50 mg/dL (8.70 mmol/L) for women increases the risk for heart disease. Triglycerides. A healthy triglyceride number is below 150 mg/dL (8.30 mmol/L). If your lipid profile is abnormal, your health care provider may do other blood tests. How is this treated? Treatment depends on the type of dyslipidemia that you have and your other risk factors for heart disease and stroke. Your health care provider will have a target range for your lipid levels based on this information. Treatment for dyslipidemia starts with lifestyle changes, such as diet and exercise. Your health care provider may recommend that you: Get regular exercise. Make changes to your diet. Quit smoking if you smoke. Limit your alcohol intake. If diet  changes and exercise do not help you reach your goals, your health care provider  may also prescribe medicine to lower lipids. The most commonly prescribed type of medicine lowers your LDL cholesterol (statin drug). If you have a high triglyceride level, your provider may prescribe another type of drug (fibrate) or an omega-3 fish oil supplement, or both. Follow these instructions at home: Eating and drinking  Follow instructions from your health care provider or dietitian about eating or drinking restrictions. Eat a healthy diet as told by your health care provider. This can help you reach and maintain a healthy weight, lower your LDL cholesterol, and raise your HDL cholesterol. This may include: Limiting your calories, if you are overweight. Eating more fruits, vegetables, whole grains, fish, and lean meats. Limiting saturated fat, trans fat, and cholesterol. Do not drink alcohol if: Your health care provider tells you not to drink. You are pregnant, may be pregnant, or are planning to become pregnant. If you drink alcohol: Limit how much you have to: 0-1 drink a day for women. 0-2 drinks a day for men. Know how much alcohol is in your drink. In the [place] one drink equals one 12 oz bottle of beer (355 mL), one 5 oz glass of wine (148 mL), or one 1 oz glass of hard liquor (44 mL). Activity Get regular exercise. Start an exercise and strength training program as told by your health care provider. Ask your health care provider what activities are safe for you. Your health care provider may recommend: 30 minutes of aerobic activity 4-6 days a week. Brisk walking is an example of aerobic activity. Strength training 2 days a week. General instructions Do not use any products that contain nicotine or tobacco. These products include cigarettes, chewing tobacco, and vaping devices, such as e-cigarettes. If you need help quitting, ask your health care provider. Take over-the-counter and prescription medicines only as told by your health care provider. This includes  supplements. Keep all follow-up visits. This is important. Contact a health care provider if: You are having trouble sticking to your exercise or diet plan. You are struggling to quit smoking or to control your use of alcohol. Summary Dyslipidemia often involves a high level of cholesterol or triglycerides, which are types of lipids. Treatment depends on the type of dyslipidemia that you have and your other risk factors for heart disease and stroke. Treatment for dyslipidemia starts with lifestyle changes, such as diet and exercise. Your health care provider may prescribe medicine to lower lipids. This information is not intended to replace advice given to you by your health care provider. Make sure you discuss any questions you have with your health care provider. Document Revised: 03/22/2022 Document Reviewed: 10/23/2020 Elsevier Patient Education  2025 [employer].

## 2024-05-05 LAB — CMP14+EGFR
ALT: 18 IU/L (ref 0–32)
AST: 22 IU/L (ref 0–40)
Albumin: 4.1 g/dL (ref 3.8–4.8)
Alkaline Phosphatase: 147 IU/L — AB (ref 44–121)
BUN/Creatinine Ratio: 19 (ref 12–28)
BUN: 40 mg/dL — AB (ref 8–27)
Bilirubin Total: 0.3 mg/dL (ref 0.0–1.2)
CO2: 23 mmol/L (ref 20–29)
Calcium: 9.3 mg/dL (ref 8.7–10.3)
Chloride: 107 mmol/L — AB (ref 96–106)
Creatinine, Ser: 2.06 mg/dL — AB (ref 0.57–1.00)
Globulin, Total: 2.3 g/dL (ref 1.5–4.5)
Glucose: 112 mg/dL — AB (ref 70–99)
Potassium: 4.7 mmol/L (ref 3.5–5.2)
Sodium: 142 mmol/L (ref 134–144)
Total Protein: 6.4 g/dL (ref 6.0–8.5)
eGFR: 24 mL/min/1.73 — AB (ref >59)

## 2024-05-05 LAB — CBC WITH DIFFERENTIAL/PLATELET
Basophils Absolute: 0 x10E3/uL (ref 0.0–0.2)
Basos: 0 %
EOS (ABSOLUTE): 0.1 x10E3/uL (ref 0.0–0.4)
Eos: 2 %
Hematocrit: 31.7 % — ABNORMAL LOW (ref 34.0–46.6)
Hemoglobin: 9.8 g/dL — ABNORMAL LOW (ref 11.1–15.9)
Immature Grans (Abs): 0.1 x10E3/uL (ref 0.0–0.1)
Immature Granulocytes: 1 %
Lymphocytes Absolute: 1.3 x10E3/uL (ref 0.7–3.1)
Lymphs: 22 %
MCH: 27.4 pg (ref 26.6–33.0)
MCHC: 30.9 g/dL — ABNORMAL LOW (ref 31.5–35.7)
MCV: 89 fL (ref 79–97)
Monocytes Absolute: 0.6 x10E3/uL (ref 0.1–0.9)
Monocytes: 10 %
Neutrophils Absolute: 3.9 x10E3/uL (ref 1.4–7.0)
Neutrophils: 65 %
Platelets: 226 x10E3/uL (ref 150–450)
RBC: 3.58 x10E6/uL — ABNORMAL LOW (ref 3.77–5.28)
RDW: 13 % (ref 11.7–15.4)
WBC: 5.9 x10E3/uL (ref 3.4–10.8)

## 2024-05-05 LAB — LIPID PANEL
Cholesterol, Total: 151 mg/dL (ref 100–199)
HDL: 56 mg/dL (ref >39)
LDL CALC COMMENT:: 2.7 ratio (ref 0.0–4.4)
LDL Chol Calc (NIH): 81 mg/dL (ref 0–99)
Triglycerides: 73 mg/dL (ref 0–149)
VLDL Cholesterol Cal: 14 mg/dL (ref 5–40)

## 2024-05-05 LAB — VITAMIN D 25 HYDROXY (VIT D DEFICIENCY, FRACTURES): Vit D, 25-Hydroxy: 63.6 ng/mL (ref 30.0–100.0)

## 2024-05-07 ENCOUNTER — Ambulatory Visit: Payer: Self-pay | Admitting: Family

## 2024-05-11 ENCOUNTER — Other Ambulatory Visit: Payer: Self-pay | Admitting: Family

## 2024-05-11 DIAGNOSIS — D509 Iron deficiency anemia, unspecified: Secondary | ICD-10-CM | POA: Insufficient documentation

## 2024-05-11 LAB — IRON AND TIBC
Iron Saturation: 6 — AB (ref 15–55)
Iron: 22 ug/dL — AB (ref 27–139)
Total Iron Binding Capacity: 354 ug/dL (ref 250–450)
UIBC: 332 ug/dL (ref 118–369)

## 2024-05-11 LAB — B12 AND FOLATE PANEL
Folate: >20 ng/mL (ref >3.0)
Vitamin B-12: 1183 pg/mL (ref 232–1245)

## 2024-05-11 LAB — SPECIMEN STATUS REPORT

## 2024-05-13 ENCOUNTER — Ambulatory Visit: Payer: Self-pay | Admitting: Nurse Practitioner

## 2024-05-27 ENCOUNTER — Encounter: Attending: Cardiology | Admitting: *Deleted

## 2024-05-27 VITALS — BP 185/72 | HR 78 | Temp 97.7°F | Resp 16

## 2024-05-27 DIAGNOSIS — E785 Hyperlipidemia, unspecified: Secondary | ICD-10-CM | POA: Diagnosis not present

## 2024-05-27 MED ORDER — INCLISIRAN SODIUM 284 MG/1.5ML ~~LOC~~ SOSY
284.0000 mg | PREFILLED_SYRINGE | Freq: Once | SUBCUTANEOUS | Status: AC
  Administered 2024-05-27: 284 mg via SUBCUTANEOUS

## 2024-05-27 NOTE — Progress Notes (Signed)
 Diagnosis: Hyperlipidemia  Provider:  Tamra Ross, MD  Procedure: Injection  Leqvio (inclisiran), Dose: 284 mg, Site: subcutaneous, Number of injections: 1  Injection Site(s): Left arm  Post Care: Observation period completed  Discharge: Condition: Good, Destination: Home . AVS Provided  Performed by:  Baldwin Darice Helling, RN

## 2024-06-01 ENCOUNTER — Ambulatory Visit: Admitting: Nurse Practitioner

## 2024-06-03 ENCOUNTER — Ambulatory Visit: Attending: Family

## 2024-06-03 DIAGNOSIS — I6523 Occlusion and stenosis of bilateral carotid arteries: Secondary | ICD-10-CM

## 2024-06-07 ENCOUNTER — Other Ambulatory Visit

## 2024-06-07 ENCOUNTER — Ambulatory Visit (HOSPITAL_BASED_OUTPATIENT_CLINIC_OR_DEPARTMENT_OTHER): Payer: Self-pay | Admitting: Family

## 2024-06-07 DIAGNOSIS — I6523 Occlusion and stenosis of bilateral carotid arteries: Secondary | ICD-10-CM

## 2024-06-07 NOTE — Telephone Encounter (Signed)
 The patient has been notified of the result and verbalized understanding.  All questions (if any) were answered.  Pt aware we will order for her to get repeat carotids in one year for surveillance.  Pt aware she will be contacted closer to that time frame to arrange that appt.   Pt aware to continue her ASA and atorvastatin  as prescribed, to prevent progression.   Pt verbalized understanding and agrees with this plan.

## 2024-06-07 NOTE — Telephone Encounter (Signed)
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 This is similar to previous. Repeat carotid duplex in 1 year for monitoring. Continue Atorvastatin , Aspirin  to  prevent progression.  ----- Message ----- From: Interface, [employer] One [employer] Sent: 06/03/2024  11:47 AM EDT To: Reche GORMAN Finder, NP

## 2024-06-12 ENCOUNTER — Other Ambulatory Visit: Payer: Self-pay

## 2024-06-12 ENCOUNTER — Emergency Department (HOSPITAL_COMMUNITY)
Admission: EM | Admit: 2024-06-12 | Discharge: 2024-06-12 | Disposition: A | Discharge status: Home / Self Care | Attending: Emergency Medicine | Admitting: Emergency Medicine

## 2024-06-12 ENCOUNTER — Encounter (HOSPITAL_COMMUNITY): Payer: Self-pay

## 2024-06-12 DIAGNOSIS — Z7982 Long term (current) use of aspirin: Secondary | ICD-10-CM | POA: Insufficient documentation

## 2024-06-12 DIAGNOSIS — I10 Essential (primary) hypertension: Secondary | ICD-10-CM | POA: Insufficient documentation

## 2024-06-12 DIAGNOSIS — R42 Dizziness and giddiness: Secondary | ICD-10-CM | POA: Diagnosis present

## 2024-06-12 DIAGNOSIS — Z79899 Other long term (current) drug therapy: Secondary | ICD-10-CM | POA: Diagnosis not present

## 2024-06-12 LAB — URINALYSIS, ROUTINE W REFLEX MICROSCOPIC
Bilirubin Urine: NEGATIVE
Glucose, UA: NEGATIVE mg/dL
Hgb urine dipstick: NEGATIVE
Ketones, ur: NEGATIVE mg/dL
Leukocytes,Ua: NEGATIVE
Nitrite: NEGATIVE
Protein, ur: NEGATIVE mg/dL
Specific Gravity, Urine: 1.01 (ref 1.005–1.030)
pH: 7 (ref 5.0–8.0)

## 2024-06-12 LAB — COMPREHENSIVE METABOLIC PANEL WITH GFR
ALT: 27 U/L (ref 0–44)
AST: 27 U/L (ref 15–41)
Albumin: 4.3 g/dL (ref 3.5–5.0)
Alkaline Phosphatase: 150 U/L — ABNORMAL HIGH (ref 38–126)
Anion gap: 14 (ref 5–15)
BUN: 40 mg/dL — ABNORMAL HIGH (ref 8–23)
CO2: 25 mmol/L (ref 22–32)
Calcium: 9.5 mg/dL (ref 8.9–10.3)
Chloride: 101 mmol/L (ref 98–111)
Creatinine, Ser: 2.05 mg/dL — ABNORMAL HIGH (ref 0.44–1.00)
GFR, Estimated: 24 mL/min — ABNORMAL LOW (ref >60)
Glucose, Bld: 88 mg/dL (ref 70–99)
Potassium: 4 mmol/L (ref 3.5–5.1)
Sodium: 140 mmol/L (ref 135–145)
Total Bilirubin: 0.4 mg/dL (ref 0.0–1.2)
Total Protein: 7.3 g/dL (ref 6.5–8.1)

## 2024-06-12 LAB — CBC WITH DIFFERENTIAL/PLATELET
Abs Immature Granulocytes: 0.01 K/uL (ref 0.00–0.07)
Basophils Absolute: 0 K/uL (ref 0.0–0.1)
Basophils Relative: 0 %
Eosinophils Absolute: 0.2 K/uL (ref 0.0–0.5)
Eosinophils Relative: 4 %
HCT: 32.7 % — ABNORMAL LOW (ref 36.0–46.0)
Hemoglobin: 10.3 g/dL — ABNORMAL LOW (ref 12.0–15.0)
Immature Granulocytes: 0 %
Lymphocytes Relative: 25 %
Lymphs Abs: 1.4 K/uL (ref 0.7–4.0)
MCH: 27.7 pg (ref 26.0–34.0)
MCHC: 31.5 g/dL (ref 30.0–36.0)
MCV: 87.9 fL (ref 80.0–100.0)
Monocytes Absolute: 0.6 K/uL (ref 0.1–1.0)
Monocytes Relative: 10 %
Neutro Abs: 3.4 K/uL (ref 1.7–7.7)
Neutrophils Relative %: 61 %
Platelets: 226 K/uL (ref 150–400)
RBC: 3.72 MIL/uL — ABNORMAL LOW (ref 3.87–5.11)
RDW: 15.5 % (ref 11.5–15.5)
WBC: 5.6 K/uL (ref 4.0–10.5)
nRBC: 0 % (ref 0.0–0.2)

## 2024-06-12 MED ORDER — HYDRALAZINE HCL 25 MG PO TABS
25.0000 mg | ORAL_TABLET | Freq: Once | ORAL | Status: AC
  Administered 2024-06-12: 25 mg via ORAL
  Filled 2024-06-12: qty 1

## 2024-06-12 MED ORDER — CLONIDINE HCL 0.1 MG PO TABS
0.1000 mg | ORAL_TABLET | Freq: Once | ORAL | Status: AC
  Administered 2024-06-12: 0.1 mg via ORAL
  Filled 2024-06-12: qty 1

## 2024-06-12 NOTE — Discharge Instructions (Addendum)
See your Physician for recheck next week,  °

## 2024-06-12 NOTE — ED Provider Notes (Signed)
 [place] EMERGENCY DEPARTMENT AT [facility] Provider Note   CSN: [id number] Arrival date & time: 06/12/24  1113     Patient presents with: Hypertension   Andrea Ross is a 78 y.o. female.   Patient complains of high blood pressure.  Patient reports today her blood pressure was higher than usual.  Patient states she is concerned it is from what she has been eating.  Patient states she has been eating a large number of grapes.  Patient reports she had some dizziness.  Patient reports that she currently is not having any dizziness.  Patient reports she has not had any weakness.  Patient denies any numbness or tingling or any extremities.  Patient reports she is walking without any difficulty.  Patient denies any chest pain she has not had any shortness of breath.  Patient is here with a family member.  The history is provided by the patient. No language interpreter was used.  Hypertension This is a new problem. The current episode started 6 to 12 hours ago. The problem occurs constantly. The problem has been gradually worsening. Nothing aggravates the symptoms. Nothing relieves the symptoms. She has tried nothing for the symptoms. The treatment provided no relief.       Prior to Admission medications   Medication Sig Start Date End Date Taking? Authorizing Provider  acetaminophen  (TYLENOL ) 325 MG tablet Take 2 tablets (650 mg total) by mouth every 6 (six) hours as needed. 11/22/23   Suellen Cantor A, PA-C  aspirin  EC 81 MG tablet Take 1 tablet (81 mg total) by mouth daily. Swallow whole. 01/30/24   Lavell Bari LABOR, FNP  atorvastatin  (LIPITOR) 80 MG tablet Take 1 tablet (80 mg total) by mouth daily. 07/02/23 02/16/25  Alvan Dorn FALCON, MD  Blood Pressure Monitoring (BLOOD PRESSURE DIGITAL ADD-ON) KIT 1 Device by Does not apply route 2 (two) times daily. 01/30/24   Lavell Bari LABOR, FNP  Calcium  Carbonate-Vit D-Min (CALCIUM  600+D3 PLUS MINERALS) 600-800 MG-UNIT TABS Take 1  tablet by mouth daily.    [provider]  Cholecalciferol  50 MCG (2000 UT) TABS Take 2,000 Units by mouth daily.    [provider]  cloNIDine  (CATAPRES ) 0.2 MG tablet Take 0.2 mg by mouth 3 (three) times daily. Patient taking differently: Take 0.2 mg by mouth 2 (two) times daily. 01/29/24   [provider]  cyanocobalamin  (VITAMIN B12) 1000 MCG tablet Take 1,000 mcg by mouth daily.    [provider]  doxazosin  (CARDURA ) 2 MG tablet TAKE 1 TABLET BY MOUTH EVERY DAY 03/09/24   Miriam Norris, NP  furosemide  (LASIX ) 20 MG tablet TAKE 2 TABLETS (40 MG TOTAL) BY MOUTH DAILY. 02/19/24   Alvan Dorn FALCON, MD  hydrALAZINE  (APRESOLINE ) 25 MG tablet Take 1 tablet (25 mg total) by mouth 2 (two) times daily. 03/11/24   Alvan Dorn FALCON, MD  labetalol  (NORMODYNE ) 100 MG tablet Take 1 tablet (100 mg total) by mouth daily. 03/09/24   Alvan Dorn FALCON, MD  lidocaine  (LIDODERM ) 5 % Place 1 patch onto the skin daily. Remove & Discard patch within 12 hours or as directed by MD 11/22/23   Suellen Cantor A, PA-C  losartan  (COZAAR ) 25 MG tablet Take 12.5 mg by mouth daily. 05/02/22   [provider]  MISC NATURAL PRODUCTS PO Take 4 tablets by mouth daily. Total Beets    [provider]  multivitamin-iron -minerals-folic acid (CENTRUM) chewable tablet Chew 1 tablet by mouth daily.    [provider]  NIFEdipine  (ADALAT  CC) 90 MG 24 hr tablet TAKE 1 TABLET BY MOUTH EVERY DAY 06/02/23   Alvan Dorn FALCON, MD  spironolactone  (ALDACTONE ) 25 MG tablet Take 25 mg by mouth daily. 02/09/20   [provider]    Allergies: Sodium bicarbonate, Pepperoni [pickled meat], and Penicillins    Review of Systems  All other systems reviewed and are negative.   Updated Vital Signs BP (!) 197/82   Pulse (!) 55   Temp 97.7 F (36.5 C) (Oral)   Resp 15   Ht 5' 5 (1.651 m)   Wt 56.7 kg   SpO2 100%   BMI 20.80 kg/m   Physical Exam Vitals and nursing note  reviewed.  Constitutional:      Appearance: Normal appearance. She is well-developed.  HENT:     Head: Normocephalic.  Cardiovascular:     Rate and Rhythm: Normal rate and regular rhythm.  Pulmonary:     Effort: Pulmonary effort is normal.     Breath sounds: Normal breath sounds.  Abdominal:     General: There is no distension.  Musculoskeletal:        General: Normal range of motion.     Cervical back: Normal range of motion.  Neurological:     General: No focal deficit present.     Mental Status: She is alert and oriented to person, place, and time.  Psychiatric:        Mood and Affect: Mood normal.     (all labs ordered are listed, but only abnormal results are displayed) Labs Reviewed  CBC WITH DIFFERENTIAL/PLATELET - Abnormal; Notable for the following components:      Result Value   RBC 3.72 (*)    Hemoglobin 10.3 (*)    HCT 32.7 (*)    All other components within normal limits  COMPREHENSIVE METABOLIC PANEL WITH GFR - Abnormal; Notable for the following components:   BUN 40 (*)    Creatinine, Ser 2.05 (*)    Alkaline Phosphatase 150 (*)    GFR, Estimated 24 (*)    All other components within normal limits  URINALYSIS, ROUTINE W REFLEX MICROSCOPIC    EKG: EKG Interpretation Date/Time:  Saturday June 12 2024 11:20:53 EDT Ventricular Rate:  65 PR Interval:  136 QRS Duration:  82 QT Interval:  396 QTC Calculation: 411 R Axis:   84  Text Interpretation: Normal sinus rhythm Nonspecific T wave abnormality Abnormal ECG When compared with ECG of 11-Mar-2024 08:38, No significant change was found Confirmed by Charlyn Sora [phone]) on 06/12/2024 1:14:14 PM  Radiology: No results found.   Procedures   Medications Ordered in the ED  hydrALAZINE  (APRESOLINE ) tablet 25 mg (25 mg Oral Given 06/12/24 1403)  cloNIDine  (CATAPRES ) tablet 0.1 mg (0.1 mg Oral Given 06/12/24 1403)                                    Medical Decision Making Patient complains of  elevated blood pressure.  Patient states that she had some dizziness earlier today and checked her blood pressure.  Patient reports she is not currently dizzy she is not having any weakness patient has not had any difficulty walking.  Patient reports she has a past medical history of a CVA and kidney disease.  Amount and/or Complexity of Data Reviewed Labs: ordered. Decision-making details documented in ED Course.    Details:  Labs ordered reviewed and interpreted  BUN and creatinine are at baseline.  CBC is normal.  Risk Prescription drug management. Risk Details: Patient is given a dose of clonidine  and hydralazine .  Patient observed.  Recheck blood pressure is 159/60.  Patient is counseled on the need to monitor her diet.  Patient is counseled and avoiding high sodium intake.  She is advised to call her primary care physician on Monday to schedule an appointment for recheck.  Patient is discharged in stable condition.        Final diagnoses:  Primary hypertension    ED Discharge Orders     None       An After Visit Summary was printed and given to the patient.    Flint Sonny POUR, PA-C 06/12/24 1753    Charlyn Sora, MD 06/15/24 2151

## 2024-06-12 NOTE — ED Triage Notes (Signed)
 Pt comes in for HTN (240/100). Pt states that she has been eating red grapes and they cause her BP to go up. Pt does take her daily medications.    A&Ox4.

## 2024-06-30 ENCOUNTER — Ambulatory Visit (INDEPENDENT_AMBULATORY_CARE_PROVIDER_SITE_OTHER): Admitting: Physician Assistant

## 2024-06-30 ENCOUNTER — Ambulatory Visit (HOSPITAL_COMMUNITY)
Admission: RE | Admit: 2024-06-30 | Discharge: 2024-06-30 | Disposition: A | Discharge status: Home / Self Care | Source: Ambulatory Visit | Attending: Vascular Surgery | Admitting: Vascular Surgery

## 2024-06-30 VITALS — BP 187/62 | HR 57 | Temp 97.7°F | Wt 122.5 lb

## 2024-06-30 DIAGNOSIS — I701 Atherosclerosis of renal artery: Secondary | ICD-10-CM

## 2024-06-30 NOTE — Progress Notes (Signed)
 HISTORY AND PHYSICAL     CC:  follow up Requesting Provider:  Lavell Bari LABOR, FNP  HPI: Andrea Ross is a 78 y.o. (10/05/1945) female who presents  with hx of renal artery stenosis (left) with left renal artery stent placement on 06/10/2022 by Dr. Sheree.  She states that she is doing ok.  She states that her blood pressure goes up when she walks a lot or goes to the doctor.  She states she went to see her kidney doctor last week and her systolic pressure was 142.  She states it does not run high in the 180's like it is today.  She states her blood pressure medications have not changed.    She denies any claudication, rest pain or non healing wounds.    She states that her head has not felt right and has had some dizziness.  She denies any amaurosis fugax, unilateral weakness, paralysis or numbness or speech difficulties.  She states she has not had a stroke since 2014 before she moved to [facility].  She has appt with her PCP.    The pt is on a statin for cholesterol management.  The pt is on a daily aspirin .   Other AC:  none The pt is on CCB, diuretic, ARB, hydralazine , clonidine  for hypertension.   The pt not diabetic.   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  09/02/1990   LUMBAR LAMINECTOMY/DECOMPRESSION MICRODISCECTOMY Right 01/21/2023   Procedure: Laminectomy and Foraminotomy - right  Lumbar three-Lumbar four - Lumbar four-Lumbar five;  Surgeon: Louis Shove, MD;  Location: [facility] OR;  Service: Neurosurgery;  Laterality: Right;   PERIPHERAL VASCULAR INTERVENTION Left 06/10/2022   Procedure: PERIPHERAL VASCULAR  INTERVENTION;  Surgeon: Sheree Penne Bruckner, MD;  Location: [facility] INVASIVE CV LAB;  Service: Cardiovascular;  Laterality: Left;   RENAL ANGIOGRAPHY  06/10/2022   Procedure: RENAL ANGIOGRAPHY;  Surgeon: Sheree Penne Bruckner, MD;  Location: [facility] INVASIVE CV LAB;  Service: Cardiovascular;;   SLT LASER APPLICATION Right 12/19/2014   Procedure: SLT LASER APPLICATION;  Surgeon: Dow JULIANNA Burke, MD;  Location: [facility] ORS;  Service: Ophthalmology;  Laterality: Right;   UMBILICAL HERNIA REPAIR  09/02/1978    Social History   Socioeconomic History   Marital status: Widowed    Spouse name: Not on file   Number of children: 2   Years of education: Not on file   Highest education level: Not on file  Occupational History   Not on file  Tobacco Use   Smoking status: Former    Current packs/day: 0.00    Average packs/day: 0.3 packs/day for 3.2 years (0.8 ttl pk-yrs)    Types: Cigarettes    Start date: 07/03/1970    Quit date: 09/02/1973    Years since quitting: 50.8    Passive exposure: Never   Smokeless tobacco: Never  Vaping Use   Vaping status: Never Used  Substance and Sexual Activity   Alcohol use: No    Alcohol/week: 0.0 standard drinks of alcohol   Drug use: No  Sexual activity: Not on file  Other Topics Concern   Not on file  Social History Narrative   Not on file   Social Drivers of Health   Financial Resource Strain: Low Risk  (04/01/2022)   Overall Financial Resource Strain (CARDIA)    Difficulty of Paying Living Expenses: Not hard at all  Food Insecurity: No Food Insecurity (04/01/2022)   Hunger Vital Sign    Worried About Running Out of Food in the Last Year: Never true    Ran Out of Food in the Last Year: Never true  Transportation Needs: No Transportation Needs (04/01/2022)   PRAPARE - Administrator, Civil Service (Medical): No    Lack of Transportation (Non-Medical): No  Physical Activity: Inactive (04/01/2022)   Exercise Vital Sign    Days of Exercise  per Week: 0 days    Minutes of Exercise per Session: 0 min  Stress: Not on file  Social Connections: Not on file  Intimate Partner Violence: Not on file    Family History  Problem Relation Age of Onset   Hypertension Mother    Heart failure Mother    Diabetes Mother    Heart disease Mother    Hypertension Father    Cancer Father    Kidney disease Brother    Hypertension Brother    Stroke Brother     Current Outpatient Medications  Medication Sig Dispense Refill   acetaminophen  (TYLENOL ) 325 MG tablet Take 2 tablets (650 mg total) by mouth every 6 (six) hours as needed. 30 tablet 0   aspirin  EC 81 MG tablet Take 1 tablet (81 mg total) by mouth daily. Swallow whole.     atorvastatin  (LIPITOR) 80 MG tablet Take 1 tablet (80 mg total) by mouth daily. 90 tablet 3   Blood Pressure Monitoring (BLOOD PRESSURE DIGITAL ADD-ON) KIT 1 Device by Does not apply route 2 (two) times daily. 1 kit 0   Calcium  Carbonate-Vit D-Min (CALCIUM  600+D3 PLUS MINERALS) 600-800 MG-UNIT TABS Take 1 tablet by mouth daily.     Cholecalciferol  50 MCG (2000 UT) TABS Take 2,000 Units by mouth daily.     cloNIDine  (CATAPRES ) 0.2 MG tablet Take 0.2 mg by mouth 3 (three) times daily. (Patient taking differently: Take 0.2 mg by mouth 2 (two) times daily.)     cyanocobalamin  (VITAMIN B12) 1000 MCG tablet Take 1,000 mcg by mouth daily.     doxazosin  (CARDURA ) 2 MG tablet TAKE 1 TABLET BY MOUTH EVERY DAY 90 tablet 3   furosemide  (LASIX ) 20 MG tablet TAKE 2 TABLETS (40 MG TOTAL) BY MOUTH DAILY. 180 tablet 1   hydrALAZINE  (APRESOLINE ) 25 MG tablet Take 1 tablet (25 mg total) by mouth 2 (two) times daily.     labetalol  (NORMODYNE ) 100 MG tablet Take 1 tablet (100 mg total) by mouth daily. 30 tablet 6   lidocaine  (LIDODERM ) 5 % Place 1 patch onto the skin daily. Remove & Discard patch within 12 hours or as directed by MD 30 patch 0   losartan  (COZAAR ) 25 MG tablet Take 12.5 mg by mouth daily.     MISC NATURAL PRODUCTS PO Take  4 tablets by mouth daily. Total Beets     multivitamin-iron -minerals-folic acid (CENTRUM) chewable tablet Chew 1 tablet by mouth daily.     NIFEdipine  (ADALAT  CC) 90 MG 24 hr tablet TAKE 1 TABLET BY MOUTH EVERY DAY 90 tablet 2   spironolactone  (ALDACTONE ) 25 MG tablet Take 25 mg by mouth daily.  No current facility-administered medications for this visit.    Allergies  Allergen Reactions   Sodium Bicarbonate Itching   Pepperoni [Pickled Meat] Swelling   Penicillins Rash     REVIEW OF SYSTEMS:   [X]  denotes positive finding, [ ]  denotes negative finding Cardiac  Comments:  Chest pain or chest pressure:    Shortness of breath upon exertion:    Short of breath when lying flat:    Irregular heart rhythm:        Vascular    Pain in calf, thigh, or hip brought on by ambulation:    Pain in feet at night that wakes you up from your sleep:     Blood clot in your veins:    Leg swelling:         Pulmonary    Oxygen at home:    Productive cough:     Wheezing:         Neurologic    Sudden weakness in arms or legs:     Sudden numbness in arms or legs:     Sudden onset of difficulty speaking or slurred speech:    Temporary loss of vision in one eye:     Problems with dizziness:         Gastrointestinal    Blood in stool:     Vomited blood:         Genitourinary    Burning when urinating:     Blood in urine:        Psychiatric    Major depression:         Hematologic    Bleeding problems:    Problems with blood clotting too easily:        Skin    Rashes or ulcers:        Constitutional    Fever or chills:      PHYSICAL EXAMINATION:  Today's Vitals   06/30/24 1041  BP: (!) 187/62  Pulse: (!) 57  Temp: 97.7 F (36.5 C)  TempSrc: Temporal  Weight: 122 lb 8 oz (55.6 kg)  PainSc: 0-No pain   Body mass index is 20.39 kg/m.   General:  WDWN in NAD; vital signs documented above Gait: Not observed HENT: WNL, normocephalic Pulmonary: normal non-labored  breathing Cardiac: regular HR, without carotid bruits Skin: without rashes Vascular Exam/Pulses:  Right Left  Radial 2+ (normal) 2+ (normal)   Extremities: without open wounds;  Musculoskeletal: no muscle wasting or atrophy  Neurologic: A&O X 3 Psychiatric:  The pt has Normal affect.   Non-Invasive Vascular Imaging on 06/30/2024:   Renal artery duplex 06/30/2024: +------------------+--------+--------+-------+  Right Renal ArteryPSV cm/sEDV cm/sComment  +------------------+--------+--------+-------+  Proximal           266      67            +------------------+--------+--------+-------+  Mid                240      48            +------------------+--------+--------+-------+  Distal              74      20            +------------------+--------+--------+-------+   +-----------------+--------+--------+-------+  Left Renal ArteryPSV cm/sEDV cm/sComment  +-----------------+--------+--------+-------+  Proximal          630     147            +-----------------+--------+--------+-------+  Mid               185      32            +-----------------+--------+--------+-------+  Distal             90      31            +-----------------+--------+--------+-------+   +------------+--------+--------+----+-----------+--------+--------+-------- Right KidneyPSV cm/sEDV cm/sRI  Left KidneyPSV cm/sEDV cm/sRI       +------------+--------+--------+----+-----------+--------+--------+--------  Upper Pole  35      12      0.66Upper Pole                 not obtained  +------------+--------+--------+----+-----------+--------+--------+--------  Mid        53      13      0.                        not obtained  +------------+--------+--------+----+-----------+--------+--------+--------  Lower Pole  43      17      0.60Lower Pole                 not obtained   +------------+--------+--------+----+-----------+--------+--------+--------  Hilar      194     58      0.70Hilar                      not obtained  +------------+--------+--------+----+-----------+--------+--------+--------   +------------------+-------+------------------+-------+  Right Kidney             Left Kidney                +------------------+-------+------------------+-------+  RAR                     RAR                        +------------------+-------+------------------+-------+  RAR (manual)      1.38   RAR (manual)      3.2      +------------------+-------+------------------+-------+  Cortex                  Cortex                     +------------------+-------+------------------+-------+  Cortex thickness  1.29 mmCorex thickness   1.20 mm  +------------------+-------+------------------+-------+  Kidney length (cm)8.70   Kidney length (cm)8.71     +------------------+-------+------------------+-------+      ASSESSMENT/PLAN:: 78 y.o. female here for follow up for left renal artery stenting in 2023 by Dr. Sheree  Renal artery stenosis -duplex today shows an elevated velocity in the left renal artery.  Her BP today is 187 systolic.  She tells me she is doing well and saw her nephrologist last week and her systolic was 142.  Her blood pressure medications have not changed.  She does have a creatinine of about 2, which has been stable over several years.   Discussed proceeding with angiogram to evaluate her left renal artery stent or returning in short follow up to repeat study.  It is noted that today's study is suboptimal and give her BP is normally around 140 and her BP medications have not changed, will have her return in 4 months with renal artery duplex with different technician to see if we can get a good study.  Pt is in agreement with this plan.   -continue  asa/statin  Dizziness -pt has palpable radial artery pulses  bilaterally.  She denies any dizziness with raising her hands over her head.  Her carotid duplex revealed 1-39% bilateral ICA stenosis.  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  She does have appt with PCP.  Discussed getting up slowly to keep from dropping BP and falling.  She expressed good understanding.   Lucie Apt, [facility] [phone]  Clinic MD:   Sheree

## 2024-07-01 ENCOUNTER — Other Ambulatory Visit: Payer: Self-pay | Admitting: *Deleted

## 2024-07-01 DIAGNOSIS — I701 Atherosclerosis of renal artery: Secondary | ICD-10-CM

## 2024-08-05 ENCOUNTER — Other Ambulatory Visit: Payer: Self-pay | Admitting: Nurse Practitioner

## 2024-08-11 ENCOUNTER — Telehealth: Payer: Self-pay | Admitting: Pharmacy Technician

## 2024-08-11 NOTE — Telephone Encounter (Addendum)
 Patient is receiving co-pay assistance  Medication: Leqvio  Program: Leqvio  Co-pay Approval Dates: Approved from 11/20/23 until indefinitely ID: [id number] Award Amount: $ 3,600

## 2024-08-12 ENCOUNTER — Other Ambulatory Visit: Payer: Self-pay | Admitting: Cardiology

## 2024-08-31 ENCOUNTER — Encounter: Payer: Self-pay | Admitting: Cardiology

## 2024-09-06 ENCOUNTER — Other Ambulatory Visit: Payer: Self-pay | Admitting: Cardiology

## 2024-09-17 ENCOUNTER — Other Ambulatory Visit

## 2024-11-02 ENCOUNTER — Ambulatory Visit: Payer: Self-pay | Admitting: Family

## 2024-11-17 ENCOUNTER — Ambulatory Visit (HOSPITAL_COMMUNITY): Admitting: Vascular Surgery

## 2024-11-17 ENCOUNTER — Ambulatory Visit (HOSPITAL_COMMUNITY)

## 2024-11-25 ENCOUNTER — Ambulatory Visit
